# Patient Record
Sex: Female | Born: 1955 | Race: White | Hispanic: No | Smoking: Never smoker
Health system: Southern US, Community
[De-identification: ages and names within clinical notes are randomized; demographics above are authoritative.]

## PROBLEM LIST (undated history)

## (undated) ENCOUNTER — Encounter: Attending: Gastroenterology | Primary: Gastroenterology

## (undated) ENCOUNTER — Encounter

## (undated) ENCOUNTER — Telehealth

## (undated) ENCOUNTER — Ambulatory Visit: Payer: MEDICARE

## (undated) ENCOUNTER — Encounter: Attending: Anesthesiology | Primary: Anesthesiology

## (undated) ENCOUNTER — Encounter: Attending: Adult Health | Primary: Adult Health

## (undated) ENCOUNTER — Ambulatory Visit

## (undated) ENCOUNTER — Ambulatory Visit: Payer: MEDICARE | Attending: Gastroenterology | Primary: Gastroenterology

## (undated) ENCOUNTER — Ambulatory Visit: Attending: Gastroenterology | Primary: Gastroenterology

## (undated) ENCOUNTER — Encounter: Attending: Internal Medicine | Primary: Internal Medicine

## (undated) ENCOUNTER — Ambulatory Visit: Attending: Physician Assistant | Primary: Physician Assistant

## (undated) ENCOUNTER — Telehealth: Attending: Gastroenterology | Primary: Gastroenterology

## (undated) DIAGNOSIS — G2581 Restless legs syndrome: Secondary | ICD-10-CM

## (undated) DIAGNOSIS — G4700 Insomnia, unspecified: Secondary | ICD-10-CM

## (undated) DIAGNOSIS — R42 Dizziness and giddiness: Secondary | ICD-10-CM

## (undated) DIAGNOSIS — F419 Anxiety disorder, unspecified: Secondary | ICD-10-CM

## (undated) DIAGNOSIS — F32A Depression, unspecified: Secondary | ICD-10-CM

## (undated) DIAGNOSIS — K5190 Ulcerative colitis, unspecified, without complications: Secondary | ICD-10-CM

## (undated) DIAGNOSIS — K219 Gastro-esophageal reflux disease without esophagitis: Secondary | ICD-10-CM

## (undated) HISTORY — DX: Anxiety disorder, unspecified: F41.9

## (undated) HISTORY — PX: SHOULDER SURGERY: SHX246

## (undated) HISTORY — DX: Dizziness and giddiness: R42

## (undated) HISTORY — DX: Restless legs syndrome: G25.81

## (undated) HISTORY — DX: Ulcerative colitis, unspecified, without complications: K51.90

## (undated) HISTORY — DX: Depression, unspecified: F32.A

## (undated) HISTORY — PX: SPINE SURGERY: SHX786

## (undated) HISTORY — DX: Insomnia, unspecified: G47.00

## (undated) HISTORY — PX: NECK SURGERY: SHX720

## (undated) HISTORY — DX: Gastro-esophageal reflux disease without esophagitis: K21.9

## (undated) MED ORDER — USTEKINUMAB 90 MG/ML SUBCUTANEOUS SYRINGE: SUBCUTANEOUS | 0.00000 days

## (undated) MED ORDER — OMEPRAZOLE 20 MG CAPSULE,DELAYED RELEASE: capsule | 1 refills | 0 days

---

## 2008-02-03 ENCOUNTER — Inpatient Hospital Stay: Payer: Self-pay | Admitting: Internal Medicine

## 2008-02-03 ENCOUNTER — Ambulatory Visit: Payer: Self-pay | Admitting: Internal Medicine

## 2008-02-25 ENCOUNTER — Ambulatory Visit: Payer: Self-pay | Admitting: Unknown Physician Specialty

## 2008-06-16 ENCOUNTER — Ambulatory Visit: Payer: Self-pay | Admitting: Internal Medicine

## 2008-07-04 ENCOUNTER — Ambulatory Visit: Payer: Self-pay | Admitting: Family Medicine

## 2009-11-25 ENCOUNTER — Emergency Department: Payer: Self-pay | Admitting: Emergency Medicine

## 2009-12-01 ENCOUNTER — Encounter: Payer: Self-pay | Admitting: Specialist

## 2009-12-05 ENCOUNTER — Emergency Department: Payer: Self-pay | Admitting: Emergency Medicine

## 2012-06-03 DIAGNOSIS — G4733 Obstructive sleep apnea (adult) (pediatric): Secondary | ICD-10-CM | POA: Insufficient documentation

## 2012-06-03 DIAGNOSIS — M810 Age-related osteoporosis without current pathological fracture: Secondary | ICD-10-CM | POA: Insufficient documentation

## 2012-06-03 DIAGNOSIS — M818 Other osteoporosis without current pathological fracture: Secondary | ICD-10-CM | POA: Insufficient documentation

## 2012-06-03 DIAGNOSIS — N393 Stress incontinence (female) (male): Secondary | ICD-10-CM | POA: Insufficient documentation

## 2012-06-03 DIAGNOSIS — R413 Other amnesia: Secondary | ICD-10-CM | POA: Insufficient documentation

## 2012-06-03 DIAGNOSIS — Z8619 Personal history of other infectious and parasitic diseases: Secondary | ICD-10-CM | POA: Insufficient documentation

## 2012-06-03 DIAGNOSIS — M8580 Other specified disorders of bone density and structure, unspecified site: Secondary | ICD-10-CM | POA: Insufficient documentation

## 2012-06-03 DIAGNOSIS — E042 Nontoxic multinodular goiter: Secondary | ICD-10-CM | POA: Insufficient documentation

## 2012-06-03 DIAGNOSIS — N959 Unspecified menopausal and perimenopausal disorder: Secondary | ICD-10-CM | POA: Insufficient documentation

## 2012-06-03 DIAGNOSIS — K589 Irritable bowel syndrome without diarrhea: Secondary | ICD-10-CM | POA: Insufficient documentation

## 2012-06-03 DIAGNOSIS — R7301 Impaired fasting glucose: Secondary | ICD-10-CM | POA: Insufficient documentation

## 2013-02-13 ENCOUNTER — Ambulatory Visit: Payer: Self-pay | Admitting: Orthopedic Surgery

## 2013-06-17 ENCOUNTER — Ambulatory Visit: Payer: Self-pay | Admitting: Internal Medicine

## 2013-06-17 LAB — URINALYSIS, COMPLETE
Glucose,UR: NEGATIVE mg/dL (ref 0–75)
Ketone: NEGATIVE
Nitrite: NEGATIVE
Ph: 7.5 (ref 4.5–8.0)
Protein: NEGATIVE
WBC UR: NONE SEEN /HPF (ref 0–5)

## 2015-10-26 DIAGNOSIS — K5100 Ulcerative (chronic) pancolitis without complications: Secondary | ICD-10-CM | POA: Insufficient documentation

## 2017-02-05 ENCOUNTER — Other Ambulatory Visit: Payer: Self-pay | Admitting: Nurse Practitioner

## 2017-02-05 DIAGNOSIS — R413 Other amnesia: Secondary | ICD-10-CM

## 2017-02-15 ENCOUNTER — Ambulatory Visit
Admission: RE | Admit: 2017-02-15 | Discharge: 2017-02-15 | Disposition: A | Discharge status: Home / Self Care | Payer: BLUE CROSS/BLUE SHIELD | Source: Ambulatory Visit | Attending: Nurse Practitioner | Admitting: Nurse Practitioner

## 2017-02-15 ENCOUNTER — Encounter: Payer: Self-pay | Admitting: Radiology

## 2017-02-15 DIAGNOSIS — M899 Disorder of bone, unspecified: Secondary | ICD-10-CM | POA: Diagnosis not present

## 2017-02-15 DIAGNOSIS — R413 Other amnesia: Secondary | ICD-10-CM | POA: Diagnosis present

## 2017-02-15 LAB — POCT I-STAT CREATININE: CREATININE: 0.8 mg/dL (ref 0.44–1.00)

## 2017-02-15 MED ORDER — GADOBENATE DIMEGLUMINE 529 MG/ML IV SOLN
15.0000 mL | Freq: Once | INTRAVENOUS | Status: AC | PRN
  Administered 2017-02-15: 14 mL via INTRAVENOUS

## 2017-03-22 ENCOUNTER — Other Ambulatory Visit: Payer: Self-pay | Admitting: Neurosurgery

## 2017-03-22 DIAGNOSIS — M899 Disorder of bone, unspecified: Secondary | ICD-10-CM

## 2017-03-25 ENCOUNTER — Other Ambulatory Visit: Payer: Self-pay | Admitting: Neurosurgery

## 2017-03-25 DIAGNOSIS — M899 Disorder of bone, unspecified: Secondary | ICD-10-CM

## 2017-04-05 ENCOUNTER — Ambulatory Visit
Admission: RE | Admit: 2017-04-05 | Discharge: 2017-04-05 | Disposition: A | Discharge status: Home / Self Care | Payer: BLUE CROSS/BLUE SHIELD | Source: Ambulatory Visit | Attending: Neurosurgery | Admitting: Neurosurgery

## 2017-05-10 ENCOUNTER — Other Ambulatory Visit: Payer: Self-pay | Admitting: Neurosurgery

## 2017-05-10 DIAGNOSIS — M899 Disorder of bone, unspecified: Secondary | ICD-10-CM

## 2017-06-11 ENCOUNTER — Ambulatory Visit
Admission: RE | Admit: 2017-06-11 | Discharge: 2017-06-11 | Disposition: A | Discharge status: Home / Self Care | Payer: BLUE CROSS/BLUE SHIELD | Source: Ambulatory Visit | Attending: Neurosurgery | Admitting: Neurosurgery

## 2017-06-11 DIAGNOSIS — M899 Disorder of bone, unspecified: Secondary | ICD-10-CM | POA: Insufficient documentation

## 2017-06-11 DIAGNOSIS — R9082 White matter disease, unspecified: Secondary | ICD-10-CM | POA: Insufficient documentation

## 2017-06-11 MED ORDER — GADOBENATE DIMEGLUMINE 529 MG/ML IV SOLN
15.0000 mL | Freq: Once | INTRAVENOUS | Status: AC | PRN
  Administered 2017-06-11: 14 mL via INTRAVENOUS

## 2017-06-12 LAB — POCT I-STAT CREATININE: Creatinine, Ser: 1 mg/dL (ref 0.44–1.00)

## 2017-07-10 ENCOUNTER — Other Ambulatory Visit: Payer: Self-pay | Admitting: Neurosurgery

## 2017-07-10 DIAGNOSIS — M899 Disorder of bone, unspecified: Secondary | ICD-10-CM

## 2017-11-17 ENCOUNTER — Emergency Department: Admit: 2017-11-17 | Discharge: 2017-11-17 | Disposition: A | Discharge status: Home / Self Care | Payer: MEDICARE

## 2017-11-17 ENCOUNTER — Ambulatory Visit: Admit: 2017-11-17 | Discharge: 2017-11-17 | Disposition: A | Discharge status: Home / Self Care | Payer: MEDICARE

## 2017-11-17 DIAGNOSIS — M549 Dorsalgia, unspecified: Principal | ICD-10-CM

## 2018-01-23 ENCOUNTER — Other Ambulatory Visit
Admission: RE | Admit: 2018-01-23 | Discharge: 2018-01-23 | Disposition: A | Discharge status: Home / Self Care | Payer: BLUE CROSS/BLUE SHIELD | Source: Ambulatory Visit | Attending: Nurse Practitioner | Admitting: Nurse Practitioner

## 2018-01-23 DIAGNOSIS — K5100 Ulcerative (chronic) pancolitis without complications: Secondary | ICD-10-CM | POA: Diagnosis present

## 2018-01-23 LAB — GASTROINTESTINAL PANEL BY PCR, STOOL (REPLACES STOOL CULTURE)

## 2018-01-23 LAB — C DIFFICILE QUICK SCREEN W PCR REFLEX
C Diff antigen: NEGATIVE
C Diff interpretation: NOT DETECTED
C Diff toxin: NEGATIVE

## 2018-01-27 LAB — CALPROTECTIN, FECAL: Calprotectin, Fecal: 428 ug/g — ABNORMAL HIGH (ref 0–120)

## 2018-03-05 ENCOUNTER — Ambulatory Visit: Admit: 2018-03-05 | Discharge: 2018-03-05 | Payer: MEDICARE | Attending: Surgery | Primary: Surgery

## 2018-03-05 DIAGNOSIS — M25512 Pain in left shoulder: Secondary | ICD-10-CM

## 2018-03-05 DIAGNOSIS — G8929 Other chronic pain: Secondary | ICD-10-CM

## 2018-03-05 DIAGNOSIS — D1722 Benign lipomatous neoplasm of skin and subcutaneous tissue of left arm: Principal | ICD-10-CM

## 2018-03-12 ENCOUNTER — Ambulatory Visit: Admit: 2018-03-12 | Discharge: 2018-03-13 | Payer: MEDICARE

## 2018-03-12 DIAGNOSIS — D1722 Benign lipomatous neoplasm of skin and subcutaneous tissue of left arm: Principal | ICD-10-CM

## 2018-03-12 DIAGNOSIS — M25512 Pain in left shoulder: Secondary | ICD-10-CM

## 2018-03-12 DIAGNOSIS — G8929 Other chronic pain: Secondary | ICD-10-CM

## 2018-03-14 ENCOUNTER — Ambulatory Visit: Admit: 2018-03-14 | Discharge: 2018-03-15 | Payer: MEDICARE

## 2018-03-14 DIAGNOSIS — G8929 Other chronic pain: Secondary | ICD-10-CM

## 2018-03-14 DIAGNOSIS — D1722 Benign lipomatous neoplasm of skin and subcutaneous tissue of left arm: Principal | ICD-10-CM

## 2018-03-14 DIAGNOSIS — M25512 Pain in left shoulder: Secondary | ICD-10-CM

## 2018-03-19 ENCOUNTER — Ambulatory Visit: Admit: 2018-03-19 | Discharge: 2018-03-20 | Payer: MEDICARE | Attending: Gastroenterology | Primary: Gastroenterology

## 2018-03-19 DIAGNOSIS — K51019 Ulcerative (chronic) pancolitis with unspecified complications: Principal | ICD-10-CM

## 2018-03-25 ENCOUNTER — Ambulatory Visit
Admission: RE | Admit: 2018-03-25 | Discharge: 2018-03-25 | Disposition: A | Discharge status: Home / Self Care | Payer: BLUE CROSS/BLUE SHIELD | Source: Ambulatory Visit | Attending: Neurosurgery | Admitting: Neurosurgery

## 2018-03-25 DIAGNOSIS — D1722 Benign lipomatous neoplasm of skin and subcutaneous tissue of left arm: Principal | ICD-10-CM

## 2018-03-26 ENCOUNTER — Ambulatory Visit: Admit: 2018-03-26 | Discharge: 2018-03-26 | Payer: MEDICARE

## 2018-03-26 ENCOUNTER — Encounter
Admit: 2018-03-26 | Discharge: 2018-03-26 | Payer: MEDICARE | Attending: Certified Registered" | Primary: Certified Registered"

## 2018-03-26 DIAGNOSIS — D1722 Benign lipomatous neoplasm of skin and subcutaneous tissue of left arm: Principal | ICD-10-CM

## 2018-03-26 MED ORDER — OXYCODONE 5 MG TABLET
ORAL_TABLET | Freq: Four times a day (QID) | ORAL | 0 refills | 0.00000 days | Status: CP | PRN
Start: 2018-03-26 — End: 2018-04-07

## 2018-04-07 ENCOUNTER — Ambulatory Visit: Admit: 2018-04-07 | Discharge: 2018-04-08 | Payer: MEDICARE | Attending: Surgery | Primary: Surgery

## 2018-04-07 DIAGNOSIS — Z4889 Encounter for other specified surgical aftercare: Principal | ICD-10-CM

## 2018-04-10 ENCOUNTER — Ambulatory Visit: Payer: BLUE CROSS/BLUE SHIELD

## 2018-04-15 MED ORDER — BUDESONIDE DR-ER 9 MG TABLET,DELAYED AND EXTENDED RELEASE
ORAL_TABLET | Freq: Every day | ORAL | 0 refills | 0.00000 days | Status: CP
Start: 2018-04-15 — End: 2018-05-08

## 2018-04-25 ENCOUNTER — Ambulatory Visit
Admission: RE | Admit: 2018-04-25 | Discharge: 2018-04-25 | Disposition: A | Discharge status: Home / Self Care | Payer: BLUE CROSS/BLUE SHIELD | Source: Ambulatory Visit | Attending: Neurosurgery | Admitting: Neurosurgery

## 2018-04-25 DIAGNOSIS — M899 Disorder of bone, unspecified: Secondary | ICD-10-CM

## 2018-04-25 MED ORDER — GADOBENATE DIMEGLUMINE 529 MG/ML IV SOLN
15.0000 mL | Freq: Once | INTRAVENOUS | Status: AC | PRN
  Administered 2018-04-25: 14 mL via INTRAVENOUS

## 2018-05-01 ENCOUNTER — Ambulatory Visit: Admit: 2018-05-01 | Discharge: 2018-05-02 | Payer: MEDICARE

## 2018-05-01 DIAGNOSIS — K51019 Ulcerative (chronic) pancolitis with unspecified complications: Principal | ICD-10-CM

## 2018-05-05 ENCOUNTER — Ambulatory Visit (INDEPENDENT_AMBULATORY_CARE_PROVIDER_SITE_OTHER)
Admit: 2018-05-05 | Discharge: 2018-05-05 | Disposition: A | Discharge status: Home / Self Care | Payer: BLUE CROSS/BLUE SHIELD | Attending: Family Medicine | Admitting: Family Medicine

## 2018-05-05 ENCOUNTER — Other Ambulatory Visit: Payer: Self-pay

## 2018-05-05 ENCOUNTER — Ambulatory Visit
Admission: EM | Admit: 2018-05-05 | Discharge: 2018-05-05 | Disposition: A | Discharge status: Home / Self Care | Payer: BLUE CROSS/BLUE SHIELD | Attending: Family Medicine | Admitting: Family Medicine

## 2018-05-05 DIAGNOSIS — R1011 Right upper quadrant pain: Secondary | ICD-10-CM

## 2018-05-05 DIAGNOSIS — R197 Diarrhea, unspecified: Secondary | ICD-10-CM | POA: Diagnosis not present

## 2018-05-05 DIAGNOSIS — R110 Nausea: Secondary | ICD-10-CM | POA: Diagnosis not present

## 2018-05-05 DIAGNOSIS — R1013 Epigastric pain: Secondary | ICD-10-CM | POA: Diagnosis not present

## 2018-05-05 LAB — COMPREHENSIVE METABOLIC PANEL
ALBUMIN: 3.4 g/dL — AB (ref 3.5–5.0)
ALT: 9 U/L (ref 0–44)
AST: 13 U/L — ABNORMAL LOW (ref 15–41)
Alkaline Phosphatase: 74 U/L (ref 38–126)
Anion gap: 13 (ref 5–15)
BILIRUBIN TOTAL: 0.4 mg/dL (ref 0.3–1.2)
BUN: 12 mg/dL (ref 8–23)
CO2: 24 mmol/L (ref 22–32)
CREATININE: 0.88 mg/dL (ref 0.44–1.00)
Calcium: 9.3 mg/dL (ref 8.9–10.3)
Chloride: 101 mmol/L (ref 98–111)
GFR calc Af Amer: >60 mL/min (ref >60)
GFR calc non Af Amer: >60 mL/min (ref >60)
GLUCOSE: 97 mg/dL (ref 70–99)
Potassium: 3.1 mmol/L — ABNORMAL LOW (ref 3.5–5.1)
SODIUM: 138 mmol/L (ref 135–145)
TOTAL PROTEIN: 7.7 g/dL (ref 6.5–8.1)

## 2018-05-05 LAB — CBC WITH DIFFERENTIAL/PLATELET
BASOS PCT: 0 %
Basophils Absolute: 0 10*3/uL (ref 0–0.1)
EOS ABS: 0.2 10*3/uL (ref 0–0.7)
EOS PCT: 2 %
HEMATOCRIT: 37.3 % (ref 35.0–47.0)
Hemoglobin: 12.3 g/dL (ref 12.0–16.0)
Lymphocytes Relative: 16 %
Lymphs Abs: 1.8 10*3/uL (ref 1.0–3.6)
MCH: 28.5 pg (ref 26.0–34.0)
MCHC: 33 g/dL (ref 32.0–36.0)
MCV: 86.2 fL (ref 80.0–100.0)
MONO ABS: 0.9 10*3/uL (ref 0.2–0.9)
MONOS PCT: 8 %
Neutro Abs: 8.4 10*3/uL — ABNORMAL HIGH (ref 1.4–6.5)
Neutrophils Relative %: 74 %
PLATELETS: 359 10*3/uL (ref 150–440)
RBC: 4.32 MIL/uL (ref 3.80–5.20)
RDW: 12.7 % (ref 11.5–14.5)
WBC: 11.4 10*3/uL — ABNORMAL HIGH (ref 3.6–11.0)

## 2018-05-05 LAB — LIPASE, BLOOD: Lipase: 23 U/L (ref 11–51)

## 2018-05-05 MED ORDER — HYDROCODONE-ACETAMINOPHEN 5-325 MG PO TABS: 1.0000 | ORAL_TABLET | Freq: Three times a day (TID) | ORAL | 0 refills | Status: DC | PRN

## 2018-05-05 MED ORDER — ACETAMINOPHEN 500 MG PO TABS
1000.0000 mg | ORAL_TABLET | Freq: Once | ORAL | Status: AC
  Administered 2018-05-05: 1000 mg via ORAL

## 2018-05-05 NOTE — ED Provider Notes (Signed)
[facility]    CSN: [id number] Arrival date & time: 05/05/18  1237  History   Chief Complaint Chief Complaint  Patient presents with  . Abdominal Pain    upper mid abdomen   HPI  62 year old female with ulcerative colitis (pan colitis) presents with severe epigastric pain.  Patient recently started a new infusion therapy.  Patient states that for the past week she has had ongoing epigastric abdominal pain.  Is worsening.  Pain is severe.  She states that it feels similar to prior bout of pancreatitis which was medication induced.  She endorses compliance with her Uceris and other medication.  She states that the pain radiates to her back.  She reports ongoing diarrhea and bloody stool which she has chronically.  No fever or chills.  She does report nausea.  No vomiting.  Patient is concerned about medication adverse effect.  No other associated symptoms.  No other complaints.  PMH: Obstructive sleep apnea 06/03/2012 Overview: OSA (mild to mod)/ PLMS (1/04)/ Intol to CPAP 5cm/ Sleep Med consult (3/08), cancelled Sleep Study, DKA F/U   IBS (irritable bowel syndrome) 06/03/2012 Overview: Irritable bowel syndrome, constipation predominant Celiac sprue serology NEG 08/2012  IFG (impaired fasting glucose) 06/03/2012 Overview: Impaired fasting glucose/ HbA1C 5/5% (08/2010)   Multinodular goiter 06/03/2012 Overview: Goiter (11/2009) on C-spine MRI and US/ TFT's WNL (low normal) (11/2009)   Osteopenia 06/03/2012 Overview: Osteopenia Nephrolithiasis, absorptive hypercalciuria H/O Increased PTH elevated (2001) 24hr urine Ca elevated (2001) PTH, Ca++, 25-OH, 1,25 OH Vit D WNL (2002) BMD -2.3 (1/09) BMD (09/2007) L-S spine -2.3; FN -1.9 (0.637) BMD (10/2009) FN T-s -2.2, 0.601 BMD (11/2011) GE Lunar Femoral Neck T-s -1.9, (0.769) Vit D low 24.1 08/2012  Nephrolithiasis 06/03/2012   History of Clostridium difficile infection 06/03/2012 Overview: C diff colitis (11/09)  Memory loss 06/03/2012 Overview:  Memory loss (2011)/ B12 466 (08/2010), Neurocognitive evaluation (10/2010) mild BCI, amnestic type, ?taper and D/C meds for mood, repeat evaluation in 1 yr   Menopausal and postmenopausal disorder 06/03/2012 Overview: Perimenopausal (8/03)/ Menstrual irreg's/ FBCD/ DUB, GYN eval'n (3/07)/LMP (2/09)/ GYN evaluation (4/09) DUB, EV U/S NEG (5/09)   Stress incontinence in female 06/03/2012   Ulcerative pancolitis (CMS-HCC) 10/2015   Pancreatitis    History of Clostridium difficile infection    Vascular lesion     Past Surgical History:  Procedure Laterality Date  . NECK SURGERY    . SHOULDER SURGERY Left   colonoscopy  OB History   None      Home Medications    Prior to Admission medications   Medication Sig Start Date End Date Taking? Authorizing Provider  Black Pepper-Turmeric (TURMERIC COMPLEX/BLACK PEPPER) 3-500 MG CAPS Take by mouth.   Yes [provider]  Budesonide ER 9 MG TB24  04/18/18  Yes [provider]  citalopram (CELEXA) 40 MG tablet TAKE 1 TABLET BY MOUTH ONCE DAILY 08/01/16  Yes [provider]  clonazePAM (KLONOPIN) 0.5 MG tablet Take by mouth. 10/25/16  Yes [provider]  omeprazole (PRILOSEC) 20 MG capsule Take by mouth. 11/29/17 11/29/18 Yes [provider]  Probiotic Product (PROBIOTIC-10) CAPS Take by mouth.   Yes [provider]  triamterene-hydrochlorothiazide (DYAZIDE) 37.5-25 MG capsule Take by mouth. 10/14/16  Yes [provider]  HYDROcodone-acetaminophen (NORCO/VICODIN) 5-325 MG tablet Take 1 tablet by mouth every 8 (eight) hours as needed for moderate pain. 05/05/18   Coral Spikes, DO    Family History Lung cancer Brother    Dementia  Father    Diabetes Father    Diabetes Mother    High blood pressure (Hypertension) Mother    Breast cancer Paternal Grandmother     Social History Social History   Tobacco Use  . Smoking status: Never Smoker  . Smokeless tobacco: Never Used    Substance Use Topics  . Alcohol use: Not Currently    Frequency: Never  . Drug use: Not Currently   Allergies   Mesalamine; Prednisone; Amoxicillin; Metronidazole; and Trazodone and nefazodone   Review of Systems Review of Systems  Constitutional: Negative for fever.  Gastrointestinal: Positive for abdominal pain, blood in stool, diarrhea and nausea. Negative for vomiting.  Neurological: Positive for dizziness.   Physical Exam Triage Vital Signs ED Triage Vitals [05/05/18 1254]  Enc Vitals Group     BP 126/71     Pulse Rate 62     Resp 18     Temp 98.5 F (36.9 C)     Temp Source Oral     SpO2 99 %     Weight      Height      Head Circumference      Peak Flow      Pain Score 10     Pain Loc      Pain Edu?      Excl. in [place]?    Updated Vital Signs BP 126/71 (BP Location: Left Arm)   Pulse 62   Temp 98.5 F (36.9 C) (Oral)   Resp 18   SpO2 99%   Physical Exam  Constitutional: She is oriented to person, place, and time. She appears well-developed.  Appears in distress secondary to pain.   HENT:  Head: Normocephalic and atraumatic.  Cardiovascular: Normal rate and regular rhythm.  Pulmonary/Chest: Effort normal. No respiratory distress.  Abdominal: Soft. She exhibits no distension.  Tender to palpation in the epigastric region.  Exam very limited as patient tolerates very little palpation.  Neurological: She is alert and oriented to person, place, and time.  Psychiatric: She has a normal mood and affect. Her behavior is normal.  Nursing note and vitals reviewed.  UC Treatments / Results  Labs (all labs ordered are listed, but only abnormal results are displayed) Labs Reviewed  COMPREHENSIVE METABOLIC PANEL - Abnormal; Notable for the following components:      Result Value   Potassium 3.1 (*)    Albumin 3.4 (*)    AST 13 (*)    All other components within normal limits  CBC WITH DIFFERENTIAL/PLATELET - Abnormal; Notable for the following components:    WBC 11.4 (*)    Neutro Abs 8.4 (*)    All other components within normal limits  LIPASE, BLOOD    EKG None  Radiology US Abdomen Limited Ruq  Result Date: 05/05/2018 CLINICAL DATA:  Epigastric pain EXAM: ULTRASOUND ABDOMEN LIMITED RIGHT UPPER QUADRANT COMPARISON:  06/17/2013, ultrasound 02/03/2008 FINDINGS: Gallbladder: No shadowing stones or wall thickening. Negative for pericholecystic fluid. Sonographer reports positive sonographic Murphy. Common bile duct: Diameter: 2.8 mm Liver: No focal lesion identified. Within normal limits in parenchymal echogenicity. Portal vein is patent on color Doppler imaging with normal direction of blood flow towards the liver. IMPRESSION: Sonographer reports significant tenderness to the right upper quadrant with transducer palpation however the study is negative for gallstones, biliary dilatation, or other sonographic evidence for an acute cholecystitis. CT correlation may be obtained as clinically indicated. Electronically Signed   By: Donavan Foil M.D.   On: 05/05/2018 14:32  Procedures Procedures (including critical care time)  Medications Ordered in UC Medications  acetaminophen (TYLENOL) tablet 1,000 mg (1,000 mg Oral Given 05/05/18 1402)    Initial Impression / Assessment and Plan / UC Course  I have reviewed the triage vital signs and the nursing notes.  Pertinent labs & imaging results that were available during my care of the patient were reviewed by me and considered in my medical decision making (see chart for details).    62 year old female presents with acute epigastric abdominal pain. [place] and labs unrevealing. Discussed with patients GI physician. He recommended stool testing. Sending home with kit. Treating pain with PRN Vicodin. [place] controlled substance database reviewed. No concerns for abuse.  Final Clinical Impressions(s) / UC Diagnoses   Final diagnoses:  Epigastric pain     Discharge Instructions     Pain medication as  needed.  Labs and [place] were negative.  GI will call with an appt.  Take care  Dr. Lacinda Axon     ED Prescriptions    Medication Sig Dispense Auth. Provider   HYDROcodone-acetaminophen (NORCO/VICODIN) 5-325 MG tablet Take 1 tablet by mouth every 8 (eight) hours as needed for moderate pain. 15 tablet Coral Spikes, DO     Controlled Substance Prescriptions [place] Controlled Substance Registry consulted? Yes, I have consulted the [place] Controlled Substances Registry for this patient, and feel the risk/benefit ratio today is favorable for proceeding with this prescription for a controlled substance.   Coral Spikes, [place] 05/05/18 1452

## 2018-05-05 NOTE — ED Triage Notes (Signed)
Patient complains of upper mid abdominal that started over the weekend. Patient states that she had an infusion on intivio on Thursday. States that she has had pain like this when lipase has been very elevate, never this severe.

## 2018-05-05 NOTE — Discharge Instructions (Signed)
Pain medication as needed.  Labs and [place] were negative.  GI will call with an appt.  Take care  Dr. Lacinda Axon

## 2018-05-06 ENCOUNTER — Other Ambulatory Visit
Admission: RE | Admit: 2018-05-06 | Discharge: 2018-05-06 | Disposition: A | Discharge status: Home / Self Care | Payer: BLUE CROSS/BLUE SHIELD | Source: Ambulatory Visit | Attending: Family Medicine | Admitting: Family Medicine

## 2018-05-06 DIAGNOSIS — R1013 Epigastric pain: Secondary | ICD-10-CM | POA: Diagnosis present

## 2018-05-06 LAB — GASTROINTESTINAL PANEL BY PCR, STOOL (REPLACES STOOL CULTURE)

## 2018-05-06 LAB — C DIFFICILE QUICK SCREEN W PCR REFLEX
C DIFFICILE (CDIFF) TOXIN: NEGATIVE
C DIFFICLE (CDIFF) ANTIGEN: NEGATIVE
C Diff interpretation: NOT DETECTED

## 2018-05-08 ENCOUNTER — Telehealth (HOSPITAL_COMMUNITY): Payer: Self-pay

## 2018-05-08 ENCOUNTER — Ambulatory Visit: Admit: 2018-05-08 | Discharge: 2018-05-09 | Payer: MEDICARE | Attending: Gastroenterology | Primary: Gastroenterology

## 2018-05-08 DIAGNOSIS — K51019 Ulcerative (chronic) pancolitis with unspecified complications: Principal | ICD-10-CM

## 2018-05-08 MED ORDER — PREDNISONE 10 MG TABLET
ORAL_TABLET | 0 refills | 0 days | Status: CP
Start: 2018-05-08 — End: 2018-05-21

## 2018-05-08 NOTE — Telephone Encounter (Signed)
Results are within normal range. Pt contacted and made aware. Verbalized understanding.

## 2018-05-13 MED ORDER — OMEPRAZOLE 20 MG CAPSULE,DELAYED RELEASE
ORAL_CAPSULE | 1 refills | 0 days | Status: CP
Start: 2018-05-13 — End: 2018-12-01

## 2018-05-14 ENCOUNTER — Ambulatory Visit
Admit: 2018-05-14 | Discharge: 2018-05-21 | Disposition: A | Discharge status: Home / Self Care | Payer: MEDICARE | Admitting: Internal Medicine

## 2018-05-14 DIAGNOSIS — F329 Major depressive disorder, single episode, unspecified: Secondary | ICD-10-CM | POA: Insufficient documentation

## 2018-05-14 DIAGNOSIS — R55 Syncope and collapse: Principal | ICD-10-CM

## 2018-05-14 DIAGNOSIS — F32A Depression, unspecified: Secondary | ICD-10-CM | POA: Insufficient documentation

## 2018-05-22 MED ORDER — PREDNISONE 10 MG TABLET
ORAL_TABLET | Freq: Every day | ORAL | 0 refills | 0.00000 days | Status: CP
Start: 2018-05-22 — End: 2018-06-06

## 2018-05-22 MED ORDER — DICYCLOMINE 10 MG CAPSULE
ORAL_CAPSULE | Freq: Four times a day (QID) | ORAL | 3 refills | 0.00000 days | Status: CP
Start: 2018-05-22 — End: 2018-08-28

## 2018-05-22 MED ORDER — PREDNISONE 50 MG TABLET
ORAL_TABLET | Freq: Every day | ORAL | 0 refills | 0 days | Status: CP
Start: 2018-05-22 — End: 2018-05-21

## 2018-05-23 ENCOUNTER — Ambulatory Visit: Admit: 2018-05-23 | Discharge: 2018-05-24 | Payer: MEDICARE

## 2018-05-23 DIAGNOSIS — K51019 Ulcerative (chronic) pancolitis with unspecified complications: Principal | ICD-10-CM

## 2018-06-06 ENCOUNTER — Ambulatory Visit: Admit: 2018-06-06 | Discharge: 2018-06-06 | Payer: MEDICARE | Attending: Gastroenterology | Primary: Gastroenterology

## 2018-06-06 ENCOUNTER — Ambulatory Visit: Admit: 2018-06-06 | Discharge: 2018-06-06 | Payer: MEDICARE

## 2018-06-06 DIAGNOSIS — K51019 Ulcerative (chronic) pancolitis with unspecified complications: Principal | ICD-10-CM

## 2018-06-06 MED ORDER — PREDNISONE 10 MG TABLET
ORAL_TABLET | 0 refills | 0 days | Status: CP
Start: 2018-06-06 — End: 2018-08-28

## 2018-07-04 ENCOUNTER — Ambulatory Visit: Admit: 2018-07-04 | Discharge: 2018-07-05 | Payer: MEDICARE

## 2018-07-04 DIAGNOSIS — K51019 Ulcerative (chronic) pancolitis with unspecified complications: Principal | ICD-10-CM

## 2018-08-26 ENCOUNTER — Ambulatory Visit: Admit: 2018-08-26 | Discharge: 2018-08-27 | Payer: MEDICARE

## 2018-08-26 DIAGNOSIS — K51019 Ulcerative (chronic) pancolitis with unspecified complications: Principal | ICD-10-CM

## 2018-08-28 ENCOUNTER — Ambulatory Visit: Admit: 2018-08-28 | Discharge: 2018-08-29 | Payer: MEDICARE

## 2018-08-28 ENCOUNTER — Ambulatory Visit: Admit: 2018-08-28 | Discharge: 2018-08-29 | Payer: MEDICARE | Attending: Gastroenterology | Primary: Gastroenterology

## 2018-08-28 DIAGNOSIS — K51019 Ulcerative (chronic) pancolitis with unspecified complications: Principal | ICD-10-CM

## 2018-10-22 MED ORDER — PEG-ELECTROLYTE SOLUTION 420 GRAM ORAL SOLUTION
0 refills | 0 days | Status: CP
Start: 2018-10-22 — End: 2018-11-26

## 2018-10-23 ENCOUNTER — Ambulatory Visit: Admit: 2018-10-23 | Discharge: 2018-10-24 | Payer: MEDICARE

## 2018-10-23 DIAGNOSIS — K51019 Ulcerative (chronic) pancolitis with unspecified complications: Principal | ICD-10-CM

## 2018-11-26 ENCOUNTER — Encounter: Admit: 2018-11-26 | Discharge: 2018-11-26 | Payer: MEDICARE

## 2018-11-26 ENCOUNTER — Ambulatory Visit: Admit: 2018-11-26 | Discharge: 2018-11-26 | Payer: MEDICARE

## 2018-11-26 DIAGNOSIS — K635 Polyp of colon: Principal | ICD-10-CM

## 2018-11-26 DIAGNOSIS — K219 Gastro-esophageal reflux disease without esophagitis: Principal | ICD-10-CM

## 2018-11-26 DIAGNOSIS — F419 Anxiety disorder, unspecified: Principal | ICD-10-CM

## 2018-11-26 DIAGNOSIS — F0390 Unspecified dementia without behavioral disturbance: Principal | ICD-10-CM

## 2018-11-26 DIAGNOSIS — K51019 Ulcerative (chronic) pancolitis with unspecified complications: Principal | ICD-10-CM

## 2018-11-26 DIAGNOSIS — K529 Noninfective gastroenteritis and colitis, unspecified: Principal | ICD-10-CM

## 2018-11-26 DIAGNOSIS — F329 Major depressive disorder, single episode, unspecified: Principal | ICD-10-CM

## 2018-11-26 DIAGNOSIS — K5140 Inflammatory polyps of colon without complications: Principal | ICD-10-CM

## 2018-11-26 DIAGNOSIS — K5100 Ulcerative (chronic) pancolitis without complications: Principal | ICD-10-CM

## 2018-11-26 DIAGNOSIS — I959 Hypotension, unspecified: Principal | ICD-10-CM

## 2018-11-26 DIAGNOSIS — T4145XA Adverse effect of unspecified anesthetic, initial encounter: Principal | ICD-10-CM

## 2018-11-26 DIAGNOSIS — G2581 Restless legs syndrome: Principal | ICD-10-CM

## 2018-11-26 LAB — HM COLONOSCOPY

## 2018-11-26 MED ORDER — HUMIRA PEN CITRATE FREE 40 MG/0.4 ML
SUBCUTANEOUS | 3 refills | 0 days | Status: CP
Start: 2018-11-26 — End: 2019-01-09

## 2018-11-26 MED ORDER — HUMIRA PEN CITRATE FREE STARTER PACK FOR CROHN'S/UC/HS 3 X 80 MG/0.8 ML
0 refills | 0 days | Status: CP
Start: 2018-11-26 — End: 2019-01-09

## 2018-11-26 NOTE — Unmapped (Signed)
Per test claim for HUMIRA at the [facility], patient needs Medication Assistance Program for Prior Authorization.

## 2018-11-26 NOTE — Unmapped (Signed)
Per Dr. Stevphen Rochester will stop remicade and start humira as alternative due to reports of significant joint pain on remicade.  Remicade discontinued and appointments cancelled. Humira scripts sent to [facility] for processing and fill.  Nurse ambassador request sent for in person self administration teaching visit.

## 2018-11-27 DIAGNOSIS — K219 Gastro-esophageal reflux disease without esophagitis: Principal | ICD-10-CM

## 2018-11-27 DIAGNOSIS — G2581 Restless legs syndrome: Principal | ICD-10-CM

## 2018-11-27 DIAGNOSIS — F0390 Unspecified dementia without behavioral disturbance: Principal | ICD-10-CM

## 2018-11-27 DIAGNOSIS — T4145XA Adverse effect of unspecified anesthetic, initial encounter: Principal | ICD-10-CM

## 2018-11-27 DIAGNOSIS — K529 Noninfective gastroenteritis and colitis, unspecified: Principal | ICD-10-CM

## 2018-11-27 DIAGNOSIS — I959 Hypotension, unspecified: Principal | ICD-10-CM

## 2018-11-27 DIAGNOSIS — F329 Major depressive disorder, single episode, unspecified: Principal | ICD-10-CM

## 2018-11-27 DIAGNOSIS — F419 Anxiety disorder, unspecified: Principal | ICD-10-CM

## 2018-12-01 MED ORDER — OMEPRAZOLE 20 MG CAPSULE,DELAYED RELEASE
ORAL_CAPSULE | 1 refills | 0 days | Status: CP
Start: 2018-12-01 — End: ?

## 2018-12-01 NOTE — Unmapped (Signed)
[facility] Special[facility] Medication Referral: PA Approved      Medication (Brand/Generic): Humira    Final Test Claim completed with resulted information below:    Patient ABLE to fill at [facility]  Insurance Company:  [employer]  Anticipated Copay: $0  Is anticipated copay with a copay card or grant? No, there is no need for grant or copay assistance.     Does patient's insurance plan only allow a 15 day supply for the first 6 fills in the [place] Program? No  If yes, inform patient they can request to dis-enroll from the [facility] by calling the patient help desk at N/A.      If the copay is under the $25 defined limit, per policy there will be no further investigation of need for financial assistance at this time unless patient requests. This referral has been communicated to the provider and handed off to the [facility] team for further processing and filling of prescribed medication.   ______________________________________________________________________  Please utilize this referral for viewing purposes as it will serve as the central location for all relevant documentation and updates.

## 2018-12-02 NOTE — Unmapped (Signed)
Called and spoke with patient today after receiving Humira approval.  She is hesitant to start for unclear reasons. Explained to her the reasoning behind the switch in terms of her new onset arthralgias that could be associated with infliximab.  Reviewed pathology result from colonoscopy.  Infliximab trough and antibody level are still pending.  Patient would prefer to wait for those results before making a final decision in terms of the switch to Humira.       [facility] will reach out to patient to coordinate delivery when ready.  Patient has heard from the IV nurse ambassador and knows to keep that contact #4 hands-on teaching visit when ready to start.  Patient verbalized understanding and agreement with plan.

## 2018-12-08 LAB — INFLIXIMAB/INFLIX ANTIBODY

## 2018-12-10 MED ORDER — CELECOXIB 100 MG CAPSULE
ORAL_CAPSULE | Freq: Two times a day (BID) | ORAL | 3 refills | 0.00000 days | Status: CP
Start: 2018-12-10 — End: 2019-01-09

## 2018-12-10 NOTE — Unmapped (Signed)
Called patient. No antibodies to infliximab.  However, infliximab could still be causing the joint pains.  Three options:    1-add celebrex to infliximab  2-add methotrexate to infliximab  3-change infliximab to humira    I discussed pros and cons of each approach.  Patient chose #1.  I asked her to update me in 1 week with how she is doing.

## 2018-12-15 NOTE — Unmapped (Signed)
Per 3/18 TC from Dr. Stevphen Rochester, will hold switch from remicade to humira for now.  [facility] has put script on hold.

## 2018-12-17 NOTE — Unmapped (Signed)
12/17/18 1456 Patient called for screening for upcoming appointment. Travel history is negative. She denies known exposure to a positive COVID case. She denies fever or recent infections. States occasional cough due to allergies. Informed her she would have to wear a mask if coughing in infusion center.

## 2018-12-18 ENCOUNTER — Ambulatory Visit: Admit: 2018-12-18 | Discharge: 2018-12-19 | Payer: MEDICARE

## 2018-12-18 DIAGNOSIS — K51019 Ulcerative (chronic) pancolitis with unspecified complications: Principal | ICD-10-CM

## 2018-12-18 LAB — CBC W/ AUTO DIFF
BASOPHILS ABSOLUTE COUNT: 0.1 10*9/L (ref 0.0–0.1)
BASOPHILS RELATIVE PERCENT: 1.2 %
EOSINOPHILS ABSOLUTE COUNT: 0.1 10*9/L (ref 0.0–0.4)
EOSINOPHILS RELATIVE PERCENT: 2.8 %
HEMATOCRIT: 36.7 % (ref 36.0–46.0)
HEMOGLOBIN: 11.6 g/dL — ABNORMAL LOW (ref 12.0–16.0)
LYMPHOCYTES RELATIVE PERCENT: 34.1 %
MEAN CORPUSCULAR HEMOGLOBIN CONC: 31.7 g/dL (ref 31.0–37.0)
MEAN CORPUSCULAR HEMOGLOBIN: 25.7 pg — ABNORMAL LOW (ref 26.0–34.0)
MEAN CORPUSCULAR VOLUME: 81 fL (ref 80.0–100.0)
MEAN PLATELET VOLUME: 7.1 fL (ref 7.0–10.0)
MONOCYTES ABSOLUTE COUNT: 0.3 10*9/L (ref 0.2–0.8)
MONOCYTES RELATIVE PERCENT: 7 %
NEUTROPHILS ABSOLUTE COUNT: 2.1 10*9/L (ref 2.0–7.5)
NEUTROPHILS RELATIVE PERCENT: 50.1 %
PLATELET COUNT: 296 10*9/L (ref 150–440)
RED BLOOD CELL COUNT: 4.53 10*12/L (ref 4.00–5.20)
RED BLOOD CELL COUNT: 4.53 10*12/L — ABNORMAL HIGH (ref 4.00–5.20)
WBC ADJUSTED: 4.3 10*9/L — ABNORMAL LOW (ref 4.5–11.0)

## 2018-12-18 NOTE — Unmapped (Signed)
1321 Patient present for Remicade infusion.  Pt states that she has been waiting since 1255 and wanted to know why it took so long to be brought back.  Explained to pt that she had not been checked as nurse ready by registration staff until [phone] and apologized for her wait.  Presents in stable condition without any recent infections.  Vitals stable.  PIV inserted.    1336  Questioned pt about Solumedrol allergy.  She stated that she was allergic to Prednisone.  Per Dr Kerry Kass note, pt has tolerated Solumedrol infusion in the past.  Solumedrol given with Zyrtec.  1338 Pt c/o itching at IV site.  Explained to pt that it was most likely from the Solumedrol and we would continue to watch it.  Explained to her that I would make a note in her chart and notify Dr Stevphen Rochester to try her infusion without it next time.  1338 Remicade 350  mg started to infuse as follows:  32ml/hr x 15 min  42ml/hr x 15 min  57ml/hr x 15 min  29ml/hr x 15 min  161ml/hr x 30 min  260ml/hr for the remainder of the infusion.  1355  Pt c/o IV site hurting.  Per Barb Merino, heat pack given, but pt denied that it helped any and wanted IV site moved.  IV was not infiltrated IV restarted in RA by Barb Merino per pt request for pt comfort.  Explained to pt that the Solumedrol was now in her system and not just in her one vein and to please let me know if the itching got worse.  Pt verbalized understanding.  1420  Pt still c/o itching, not worse and feeling a little restless.  No hives or rash noted.  Will continue to monitor.  Pt up to restroom.  1538 Remicade infusion complete.  Patient tolerated infusion.  Vitals stable. PIV flushed with NS.  1541 NS flush complete.  IV d/c'd.  Patient discharged.

## 2018-12-19 DIAGNOSIS — K51019 Ulcerative (chronic) pancolitis with unspecified complications: Principal | ICD-10-CM

## 2018-12-22 DIAGNOSIS — L932 Other local lupus erythematosus: Principal | ICD-10-CM

## 2018-12-22 DIAGNOSIS — K51019 Ulcerative (chronic) pancolitis with unspecified complications: Principal | ICD-10-CM

## 2018-12-22 DIAGNOSIS — T50905A Adverse effect of unspecified drugs, medicaments and biological substances, initial encounter: Principal | ICD-10-CM

## 2018-12-22 NOTE — Unmapped (Signed)
I had an 18 minute phone conversation today.  Discussed positive ANA 1:640.  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  I discussed pros and cons of following options:    -Humira  -Stelara  Diamond Mcconnell    Though her UC responded well to infliximab and therefore might be well-treated with Humira, there is a risk of drug induced lupus with Humira too (though lower risk than infliximab-0.1% vs. 0.2%).  I suggested we try stelara next.  Risks of infection and malignancy were reviewed.  Patient agreeable to starting.  Will initiate prior authorization. If flare during stelara, then consider Xeljanz or Humira.

## 2018-12-23 DIAGNOSIS — K51019 Ulcerative (chronic) pancolitis with unspecified complications: Principal | ICD-10-CM

## 2018-12-23 NOTE — Unmapped (Signed)
Per Dr. 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  Stelara therapy plan placed for IV infusion.  Will plan to send SQ dose to [facility] once IV dose has been received.  Will need to discuss copay card information with patient as well.

## 2018-12-23 NOTE — Unmapped (Signed)
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  I talked to her on the phone about this.  Can you please switch her from remicade to stelara? She's awaiting instructions.    Thanks,  Cletis Athens  ----- Message -----  From: Background User Lab  Sent: 12/18/2018   2:05 PM EDT  To: Luanne Bras, MD

## 2018-12-24 LAB — ANTI-DNA ANTIBODY, DOUBLE-STRANDED: DS-DNA TITER: 1:80 {titer}

## 2018-12-30 NOTE — Unmapped (Signed)
Request sent to MAP team for update on stelara PA.

## 2019-01-01 NOTE — Unmapped (Signed)
Was able to discuss approval with patient today along with copay assistance that she was encouraged to sign up for. Link sent.     Discussed timing of IV and SQ dosing on every 8week schedule.  She will be due for first SQ dose week of June 15.     Patient verbalized understanding and in agreement with plan.

## 2019-01-01 NOTE — Unmapped (Signed)
Approval received. TIC notified to call to schedule patient. Infusion scheduled for 01/12/2019.

## 2019-01-15 ENCOUNTER — Ambulatory Visit: Admit: 2019-01-15 | Discharge: 2019-01-16 | Payer: MEDICARE

## 2019-01-15 DIAGNOSIS — K51019 Ulcerative (chronic) pancolitis with unspecified complications: Principal | ICD-10-CM

## 2019-02-03 MED ORDER — STELARA 90 MG/ML SUBCUTANEOUS SYRINGE
5 refills | 0 days | Status: CP
Start: 2019-02-03 — End: 2019-05-15
  Filled 2019-03-10: qty 1, 30d supply, fill #0

## 2019-02-03 MED ORDER — EMPTY CONTAINER
PRN refills | 0 days
Start: 2019-02-03 — End: ?

## 2019-02-03 NOTE — Unmapped (Signed)
SQ dose sent to [facility] for processing and fill.

## 2019-02-06 NOTE — Unmapped (Signed)
[facility] Specialty Medication Referral: PA Approved      Medication (Brand/Generic): [facility]    Final Test Claim completed with resulted information below:    Patient ABLE to fill at [facility]  Insurance Company:  [employer]  Anticipated Copay: $0  Is anticipated copay with a copay card or grant? No, there is no need for grant or copay assistance.     Does patient's insurance plan only allow a 15 day supply for the first 6 fills in the [place] Program? NO  If yes, inform patient they can request to dis-enroll from the [facility] by calling the patient help desk at NOT APPLICABLE.      If the copay is under the $25 defined limit, per policy there will be no further investigation of need for financial assistance at this time unless patient requests. This referral has been communicated to the provider and handed off to the [facility] team for further processing and filling of prescribed medication.   ______________________________________________________________________  Please utilize this referral for viewing purposes as it will serve as the central location for all relevant documentation and updates.

## 2019-02-26 ENCOUNTER — Institutional Professional Consult (permissible substitution): Admit: 2019-02-26 | Discharge: 2019-02-27 | Payer: MEDICARE | Attending: Gastroenterology | Primary: Gastroenterology

## 2019-02-26 NOTE — Unmapped (Addendum)
-  Continue prednisone taper  -Increase Celebrex to 100 mg by mouth twice daily.  If no relief in joint pains after 1 week, notify my office and we may increase your prednisone taper.  -Continue with plans for Stelara injections.  I will have my office contact you to confirm the plan for home injection.  -Follow-up with primary care doctor regarding memory difficulties  -Clinic with me in 3 months or sooner if needed.

## 2019-02-26 NOTE — Unmapped (Signed)
PRIMARY PHYSICIAN:  Laurian Brim, MD    REASON FOR VISIT:  Ulcerative colitis    HISTORY OF PRESENT ILLNESS:  Since last visit, patient had colonoscopy in March that showed scarring but no active IBD.  Unfortunately, she was experiencing severe joint pains at that time.  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  Remicade was therefore switched to Stelara.  She is received her IV loading dose and is due for her first subcutaneous dose later this month.  No problems with the Stelara so far.  To help with her joint pains, I had prescribed prednisone.  The prednisone has helped.  However, she tapered to 5 mg 3 days ago and just today experienced worsening right hand pain.  No other joints are bothering her.  She is only taking the Celebrex once daily rather than twice daily.  1bm per day. No urgency or bleeding or abdominal pain.  Feels like colitis is in remission.  She has been experiencing what she describes as memory difficulties.    MEDICATIONS:  has a current medication list which includes the following prescription(s): citalopram, clonazepam, diphenhydramine, omeprazole, trazodone, triamterene-hydrochlorothiazide, and stelara, and the following Facility-Administered Medications: famotidine.    ALLERGIES:  Allergies as of 02/26/2019 - Reviewed 11/26/2018   Allergen Reaction Noted   ??? Mesalamine Other (See Comments)    ??? Amoxicillin Other (See Comments) 05/25/2008   ??? Metronidazole Nausea And Vomiting 03/09/2012   ??? Trazodone Other (See Comments) 05/03/2016   ??? Prednisone Itching        PAST MEDICAL HISTORY:  1.  Panulcerative colitis.  Diagnosed 1981.  Asacol started.  Within a few days, hospitalized for pancreatitis.  Asacol stopped.  Oral mesalamine never revisited.  Canasa and Rowasa enemas were tolerated and resulted in partial relief of symptoms.  Briefly took prednisone on one occasion and it caused weight gain, hair loss, insomnia.  Uceris helped colitis symptoms but was too expensive to continue.  07/2017, colitis flare.  Sulfasalazine tried without success.  03/19/2018, start Uceris.  Minimal relief.  05/01/2018, start Entyvio.  Continued flare.  05/08/2018, start prednisone.  05/14/2018, hospitalized for flare.  IV steroids and discharged on oral prednisone.  05/23/2018, start infliximab 5 mg/kg.  08/28/2018, steroid free remission.  11/2018, severe joint pains.  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  Change infliximab to ustekinumab.  Prednisone taper and Celebrex as needed.  2.  Restless leg syndrome  3.  History of C. difficile colitis  4.  Anxiety  5.  Heartburn  6.  Hiatal hernia  7.  08/28/2018, PCV 13    SOCIAL HISTORY:  Works in office support at [employer]  Does not smoke    FAMILY HISTORY:  No colon cancer or IBD    PHYSICAL EXAM:  There were no vitals taken for this visit.  Wt Readings from Last 4 Encounters:   01/15/19 73.9 kg (163 lb 0.5 oz)   12/18/18 73.8 kg (162 lb 11.2 oz)   11/26/18 72.6 kg (160 lb)   10/23/18 72.9 kg (160 lb 11.5 oz)   Conversational.    TEST DATA:  1.  11/21/2015, colonoscopy secondhand report showed pancolitis with biopsy showing chronic active colitis  2.  12/28/2016, DEXA scan.  Lumbar spine T= -2.9, left hip T= -1.9, left femoral neck T= -2.2  3.  01/03/2018, CBC, CMP normal.  Iron saturation 36, ferritin 31, CRP 1.7 mg/L, IgA 422, TTG IgA negative, 25 vitamin D 25.1, ESR 44  4.  01/23/2018, GI pathogen panel negative, C. difficile toxin negative, fecal calprotectin 428  5.  02/18/2018, colonoscopy to transverse colon.  Moderate erythema, granularity, edema from anus to hepatic flexure.  I personally reviewed color images and agree.  6.  05/05/2018, right upper quadrant ultrasound showed no evidence of stones or biliary ductal dilatation.  7.  05/05/2018, GI pathogen panel, C. difficile toxin negative.  8.  05/14/2018, CT abdomen and pelvis.  Mild thickening and enhancement with prominent vasa recta from the rectum to a sending colon.  9.  05/16/2018, EGD.  Large paraesophageal hiatal hernia with a few Cameron's erosions.  Otherwise normal.  10.  05/16/2018, sigmoidoscopy to 45 cm.  Fair bowel prep.  Severe ulcers and spontaneous bleeding from the rectum to sigmoid colon consistent with [facility] 3 disease.  11.  05/16/2018, hepatitis B surface antigen negative, hepatitis C antibody negative, PPD negative.  12.  07/04/2018, trough infliximab level 12  13.  11/25/2018, colonoscopy to terminal ileum x7 cm.  Normal ileum.  Patchy scarring and pseudopolyps in transverse and proximal descending colon.  Continuous scarring and tubular colon from rectum to proximal descending colon.  No active IBD.  Chromocolonoscopy negative for dysplastic features.  14.  12/18/2018, ANA>1:640, anti-dsDNA Ab 1:80, CRP less than 5 mg/L    ASSESSMENT:  1.  Panulcerative colitis.  Currently in symptomatic remission on Stelara monotherapy and low-dose prednisone.  2.  History of mesalamine associated pancreatitis.  Given the temporal association that she describes, I think it is likely that the mesalamine caused the pancreatitis rather than a coincidence.  Therefore, would not recommend using oral mesalamine in the future because of the risk of recurrent pancreatitis.  3.  Osteoporosis.  Likely exacerbated by steroid use.  It does not sound like patient has seen anyone to initiate treatment for this.  I would recommend that she talk with her primary care doctor about treating this.  Try to minimize steroid exposure in the future.  4.  Chronic heartburn.  Well controlled on omeprazole.  Continue this.  If symptoms become refractory, then could consider fundoplication given her large hiatal hernia.  5.  Joint pains.  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  Recommend increasing Celebrex to 100 mg twice daily for now.  If no relief, then consider extending the prednisone taper.  Otherwise, continue tapering prednisone as previously directed.  Hopefully, this will subside with avoidance of infliximab.  6.  Memory difficulties.  I do not think this is related to her medications.  Follow-up with primary care doctor.    RECOMMENDATIONS AND PLAN:  Patient Instructions   -Continue prednisone taper  -Increase Celebrex to 100 mg by mouth twice daily.  If no relief in joint pains after 1 week, notify my office and we may increase your prednisone taper.  -Continue with plans for Stelara injections.  I will have my office contact you to confirm the plan for home injection.  -Follow-up with primary care doctor regarding memory difficulties  -Clinic with me in 3 months or sooner if needed.      I spent 12 minutes on the phone with the patient. I spent an additional 10 minutes on pre- and post-visit activities.     The patient was physically located in [place] or a state in which I am permitted to provide care. The patient and/or parent/gauardian understood that s/he may incur co-pays and cost sharing, and agreed to the telemedicine visit. The visit was completed via phone  and/or video, which was appropriate and reasonable under the circumstances given the patient's presentation at the time.    The patient and/or parent/guardian has been advised of the potential risks and limitations of this mode of treatment (including, but not limited to, the absence of in-person examination) and has agreed to be treated using telemedicine. The patient's/patient's family's questions regarding telemedicine have been answered.     If the phone/video visit was completed in an ambulatory setting, the patient and/or parent/guardian has also been advised to contact their provider???s office for worsening conditions, and seek emergency medical treatment and/or call 911 if the patient deems either necessary.

## 2019-03-02 NOTE — Unmapped (Signed)
[facility]   Patient Onboarding/Medication Counseling    Diamond Mcconnell is a 63 y.o. female with ulcerative colitis who I am counseling today on initiation of therapy.  I am speaking to the patient.    Verified patient's date of birth / HIPAA.    Specialty medication(s) to be sent: Inflammatory Disorders: Stelara      Non-specialty medications/supplies to be sent: sharps kit       Stelara (ustekinumab)    Medication & Administration     Dosage: Ulcerative colitis: Inject 90mg  under the skin every 8 weeks starting 8 weeks after IV induction dose    Lab tests required prior to treatment initiation:  ? Tuberculosis: Tuberculosis screening resulted in a non-reactive TB skin test.    Administration:     Prefilled syringe  1. Gather all supplies needed for injection on a clean, flat working surface: medication syringe(s) removed from packaging, alcohol swab, sharps container, etc.  2. Look at the medication label ??? look for correct medication, correct dose, and check the expiration date  3. Look at the medication ??? the liquid in the syringe should appear clear and colorless to slightly yellow, you may see a few white particles  4. Lay the syringe on a flat surface and allow it to warm up to room temperature for at least 15-30 minutes  5. Select injection site ??? you can use the front of your thigh or your belly (but not the area 2 inches around your belly button); if someone else is giving you the injection you can also use your upper arm in the skin covering your triceps muscle or in the buttocks  6. Prepare injection site ??? wash your hands and clean the skin at the injection site with an alcohol swab and let it air dry, do not touch the injection site again before the injection  7. Pull off the needle safety cap, do not remove until immediately prior to injection  8. Pinch the skin ??? with your hand not holding the syringe pinch up a fold of skin at the injection site using your forefinger and thumb  9. Insert the needle into the fold of skin at about a 45 degree angle ??? it's best to use a quick dart-like motion  10. Push the plunger down slowly as far as it will go until the syringe is empty, if the plunger is not fully depressed the needle shield will not extend to cover the needle when it is removed, hold the syringe in place for a full 5 seconds  11. Check that the syringe is empty and keep pressing down on the plunger while you pull the needle out at the same angle as inserted; after the needle is removed completely from the skin, release the plunger allowing the needle shield to activate and cover the used needle  12. Dispose of the used syringe immediately in your sharps disposal container, do not attempt to recap the needle prior to disposing  13. If you see any blood at the injection site, press a cotton ball or gauze on the site and maintain pressure until the bleeding stops, do not rub the injection site      Adherence/Missed dose instructions:  If your injection is given more than 7-10 days after your scheduled injection date ??? consult your pharmacist for additional instructions on how to adjust your dosing schedule.    Goals of Therapy     ? Achieve remission of symptoms  ? Maintain  remission of symptoms  ? Minimize long-term systemic glucocorticoid use  ? Prevent need for surgical procedures  ? Maintenance of effective psychosocial functioning      Side Effects & Monitoring Parameters     ? Injection site reaction (redness, irritation, inflammation localized to the site of administration)  ? Signs of a common cold ??? minor sore throat, runny or stuffy nose, etc.  ? Feeling tired or weak  ? Headache    The following side effects should be reported to the provider:  ? Signs of a hypersensitivity reaction ??? rash; hives; itching; red, swollen, blistered, or peeling skin; wheezing; tightness in the chest or throat; difficulty breathing, swallowing, or talking; swelling of the mouth, face, lips, tongue, or throat; etc.  ? Reduced immune function ??? report signs of infection such as fever; chills; body aches; very bad sore throat; ear or sinus pain; cough; more sputum or change in color of sputum; pain with passing urine; wound that will not heal, etc.  Also at a slightly higher risk of some malignancies (mainly skin and blood cancers) due to this reduced immune function.  o In the case of signs of infection ??? the patient should hold the next dose of Stelara?? and call your primary care provider to ensure adequate medical care.  Treatment may be resumed when infection is treated and patient is asymptomatic.  ? Changes in skin ??? a new growth or lump that forms; changes in shape, size, or color of a previous mole or marking  ? Shortness of breath or chest pain  ? Vaginal itching or discharge      Contraindications, Warnings, & Precautions     ? Have your bloodwork checked as you have been told by your prescriber  ? Talk with your doctor if you are pregnant, planning to become pregnant, or breastfeeding  ? Discuss the possible need for holding your dose(s) of Stelara?? when a planned procedure is scheduled with the prescriber as it may delay healing/recovery timeline       Drug/Food Interactions     ? Medication list reviewed in Epic. The patient was instructed to inform the care team before taking any new medications or supplements. No drug interactions identified.   ? If you have a latex allergy use caution when handling, the needle cap of the Stelara?? prefilled syringe contains a derivative of natural rubber latex  ? Talk with you prescriber or pharmacist before receiving any live vaccinations while taking this medication and after you stop taking it    Storage, Handling Precautions, & Disposal     ? Store this medication in the refrigerator.  Do not freeze  ? If needed, you may store at room temperature for up to 30 days  ? Store in [employer], protected from light  ? Do not shake  ? Dispose of used syringes/pens in a sharps disposal container    ** Due to not having time to complete the conversation, the patient declined the following portions of the counseling outlined above: administration; adherence / missed dose instructions; goals of therapy; drug/food interactions; storage, handling precautions, and disposal.  She stated she will call with any questions and will consult with her doctor as needed. **      Current Medications (including OTC/herbals), Comorbidities and Allergies     Current Outpatient Medications   Medication Sig Dispense Refill   ??? celecoxib (CELEBREX) 100 MG capsule Take 100 mg by mouth daily.     ??? citalopram (CELEXA) 40  MG tablet TAKE 1 TABLET BY MOUTH ONCE DAILY     ??? diphenhydrAMINE (BENADRYL) 25 mg capsule/tablet Take 25 mg by mouth every six (6) hours as needed for itching.     ??? omeprazole (PRILOSEC) 20 MG capsule TAKE 1 CAPSULE BY MOUTH EVERY DAY 30 MINUTES BEFORE BREAKFAST 90 capsule 1   ??? traZODone (DESYREL) 50 MG tablet Take 50 mg by mouth.     ??? ustekinumab (STELARA) 90 mg/mL Syrg syringe Inject the contents of 1 pen (90mg ) under the skin once every 8 weeks. 1 mL 5     Current Facility-Administered Medications   Medication Dose Route Frequency Provider Last Rate Last Dose   ??? famotidine (PEPCID) tablet 10 mg  10 mg Oral Once Lendell Caprice, RN           Allergies   Allergen Reactions   ??? Mesalamine Other (See Comments)     Pancreatitis   ??? Amoxicillin Other (See Comments)     unknown   ??? Metronidazole Nausea And Vomiting     Abdominal pain, dysgeusia   ??? Trazodone Other (See Comments)     Severe back pack with muscle aches   ??? Prednisone Itching     Currently on prednisone and tolerating        Patient Active Problem List   Diagnosis   ??? Ulcerative pancolitis with complication (CMS-HCC)   ??? Multinodular goiter   ??? Nephrolithiasis   ??? Obstructive sleep apnea   ??? Stress incontinence in female   ??? Other osteoporosis without current pathological fracture   ??? Syncope   ??? Depression   ??? Drug-induced lupus erythematosus       Current medications and documented allergies were not able to be reviewed on today's call due to the patient ending the conversation.  They have been reviewed as listed in Epic.    Appropriateness of Therapy     Is medication and dose appropriate based on diagnosis? Yes    Baseline Quality of Life Assessment      How many days over the past month did your ulcerative colitis keep you from your normal activities? Ms. Coggin was unable to quantify this    Financial Information     Medication Assistance provided: Prior Authorization    Anticipated copay of $0.00 reviewed with patient. Verified delivery address.    Delivery Information     Scheduled delivery date: 03/10/2019    Expected start date: 03/12/2019    Medication will be delivered via Same Day Courier to the home address in [place].  This shipment will not require a signature.      Explained the services we provide at [facility] and that each month we would call to set up refills.  Stressed importance of returning phone calls so that we could ensure they receive their medications in time each month.  Informed patient that we should be setting up refills 7-10 days prior to when they will run out of medication.  A pharmacist will reach out to perform a clinical assessment periodically.  Informed patient that a welcome packet and a drug information handout will be sent.      Patient verbalized understanding of the above information as well as how to contact the pharmacy at [phone] option 4 with any questions/concerns.  The pharmacy is open Monday through Friday 8:30am-4:30pm.  A pharmacist is available 24/7 via pager to answer any clinical questions they may have.    Patient Specific  Needs     ? Does the patient have any physical, cognitive, or cultural barriers? No    ? Patient prefers to have medications discussed with  Patient     ? Is the patient able to read and understand education materials at a high school level or above? Yes    ? Patient's primary language is  English     ? Is the patient high risk? No     ? Does the patient require a Care Management Plan? No     ? Does the patient require physician intervention or other additional services (i.e. nutrition, smoking cessation, social work)? No      Karene Fry Akya Fiorello  [facility] Specialty Pharmacist

## 2019-03-04 NOTE — Unmapped (Signed)
LM for patient today explaining that she will hear from [facility] to coordinate delivery of stelara for dose due next week.  Will need to work on getting her access to the self administration video and/or reviewing by phone.  Will try her again.

## 2019-03-09 NOTE — Unmapped (Signed)
[facility] has tried to reach patient [phone] without success.  Left her additional voice message today, sent mychart message and urgent email requesting that she follow up.

## 2019-03-10 MED FILL — STELARA 90 MG/ML SUBCUTANEOUS SYRINGE: 30 days supply | Qty: 1 | Fill #0 | Status: AC

## 2019-03-10 MED FILL — EMPTY CONTAINER: 30 days supply | Qty: 1 | Fill #0 | Status: AC

## 2019-03-10 MED FILL — EMPTY CONTAINER: 30 days supply | Qty: 1 | Fill #0

## 2019-03-13 LAB — HM MAMMOGRAPHY

## 2019-03-16 NOTE — Unmapped (Signed)
Patient was able to self inject first SQ stelara dose successfully and will proceed as ordered every 8weeks.  She will update prn.

## 2019-03-24 LAB — HM DEXA SCAN

## 2019-04-03 MED ORDER — CELECOXIB 100 MG CAPSULE
ORAL_CAPSULE | 0 refills | 0 days | Status: CP
Start: 2019-04-03 — End: 2019-05-04

## 2019-04-23 NOTE — Unmapped (Signed)
Spoke with pt today. She was requesting a fill of her stelara due on  8/20 now prior to her commercial insurance expiring at the end of July.   Reached out to [employer] - unfortunetly the claim is stating too soon to fill and she will not be able get a dose in July with her currnet insurance.   She provided me with her new medicare insurance starting August 1st:     [employer] [place] medicare hmo -   Member ID [id number]    Discussed options of applying for mfr assistance or applying for assistance with [employer]. Will have pharmacy run benefits investigation on Monday 8/3 to start process. [place]. Updated as well

## 2019-04-27 NOTE — Unmapped (Signed)
Per test claim for [facility], patient needs Medication Assistance Program for High Copay OF $3217.80 ON NEW INSURANCE.

## 2019-04-30 NOTE — Unmapped (Signed)
Patient has switched to Medicare as of 04/25/2019 which unfortunately causes coverage issues with stelara.  She will apply for JJPAF and request also sent to [employer] for assist. Patient updated.

## 2019-05-01 ENCOUNTER — Encounter: Payer: Self-pay | Admitting: Nurse Practitioner

## 2019-05-04 ENCOUNTER — Other Ambulatory Visit: Payer: Self-pay

## 2019-05-04 ENCOUNTER — Encounter: Payer: Self-pay | Admitting: Nurse Practitioner

## 2019-05-04 ENCOUNTER — Ambulatory Visit (INDEPENDENT_AMBULATORY_CARE_PROVIDER_SITE_OTHER): Payer: Self-pay | Admitting: Nurse Practitioner

## 2019-05-04 VITALS — BP 111/75 | HR 49 | Temp 98.5°F | Ht 62.3 in | Wt 159.0 lb

## 2019-05-04 DIAGNOSIS — F330 Major depressive disorder, recurrent, mild: Secondary | ICD-10-CM

## 2019-05-04 DIAGNOSIS — K449 Diaphragmatic hernia without obstruction or gangrene: Secondary | ICD-10-CM

## 2019-05-04 DIAGNOSIS — K5100 Ulcerative (chronic) pancolitis without complications: Secondary | ICD-10-CM

## 2019-05-04 MED ORDER — CELECOXIB 100 MG CAPSULE
ORAL_CAPSULE | 0 refills | 0 days | Status: CP
Start: 2019-05-04 — End: ?

## 2019-05-04 NOTE — Assessment & Plan Note (Signed)
Chronic, ongoing with use of Citalopram and Trazodone.  Continue current medication regimen and adjust as needed.  Denies SI/HI.

## 2019-05-04 NOTE — Patient Instructions (Signed)
Ulcerative Colitis, Adult  Ulcerative colitis is long-lasting (chronic) inflammation of the large intestine (colon) and rectum. Sores (ulcers) may also form in these areas. Ulcerative colitis, along with a closely related condition called Crohn's disease, is often referred to as inflammatory bowel disease (IBD). What are the causes? This condition may be caused by increased activity of the immune system in the intestines. The immune system is the system that protects the body against harmful bacteria, viruses, fungi, and other things that can make you sick. The cause of the increased activity of the immune system is not known. What increases the risk? The following factors may make you more likely to develop this condition:  Being 43-47 years old. The risk is also increased for people who are 31-108 years old.  Having a family history of ulcerative colitis.  Being of Jewish descent. What are the signs or symptoms? Symptoms vary depending on how severe the condition is. Common symptoms include:  Rectal bleeding.  Diarrhea, often with blood or pus in the stool. Other symptoms can include:  Pain or cramping in the abdomen.  Fever.  Fatigue.  Weight loss.  Night sweats.  Rectal pain.  A strong and sudden need to have a bowel movement (bowel urgency).  Nausea.  Loss of appetite.  Anemia.  Yellowing of the skin (jaundice) from liver dysfunction.  Joint pain or soreness.  Eye irritation.  Skin rashes. Symptoms can range from mild to severe. They may come and go. How is this diagnosed? This condition may be diagnosed based on:  Your symptoms and medical history.  A physical exam.  Tests, including: ? Blood tests and stool tests. ? X-ray. ? A CT scan. ? An MRI. ? Colonoscopy. For this test, a flexible tube is inserted into your anus, and your colon is examined. ? Biopsy. In this test, a tissue sample is taken from your colon and examined under a microscope. How  is this treated? Treatment for this condition may include medicines to:  Decrease swelling and inflammation.  Control your immune system.  Treat infections.  Relieve pain.  Control diarrhea. Severe flare-ups may need to be treated at a hospital. Treatment in a hospital may involve:  Resting the bowel. This involves not eating or drinking for a period of time.  Getting medicines through an IV.  Getting fluids and nutrition through: ? An IV. ? A tube that is passed through the nose and into the stomach (nasogastric tube, or NG tube).  Surgery to remove the affected part of the colon. This may be done if other treatments are not helping. This condition increases the risk of colon cancer. Adults with this condition will need to be watched for colon cancer throughout life. Follow these instructions at home: Medicines and vitamins  Take over-the-counter and prescription medicines only as told by your health care provider. Do not take aspirin.  If you were prescribed an antibiotic medicine, take it as told by your health care provider. Do not stop taking the antibiotic even if you start to feel better.  Ask your health care provider if you should take any vitamins or supplements. You may need to take: ? Calcium and vitamin D for bone health. ? Iron to help treat anemia. Lifestyle  Exercise regularly.  Work with your health care provider to manage your condition and educate yourself about your condition.  Do not use any products that contain nicotine or tobacco, such as cigarettes, e-cigarettes, and chewing tobacco. If you need help quitting,  ask your health care provider.  If you drink alcohol: ? Limit how much you use to:  0-1 drink a day for women.  0-2 drinks a day for men. ? Be aware of how much alcohol is in your drink. In the [place] one drink equals one 12 oz bottle of beer (355 mL), one 5 oz glass of wine (148 mL), or one 1 oz glass of hard liquor (44 mL). Eating and  drinking  Drink enough fluid to keep your urine pale yellow.  Ask your health care provider about the best diet for you. Follow the diet as told by your health care provider. This may include: ? Avoiding carbonated drinks. ? Avoiding popcorn, vegetable skins, nuts, and other high-fiber foods. ? Avoiding high-fat foods. ? Eating smaller meals more often. ? Limiting your intake of sugary drinks. ? Limiting your caffeine intake.  Follow food safety recommendations as told by your health care provider. This may include making sure you: ? Avoid eating raw or undercooked meat, fish, or eggs. ? Do not eat or drink spoiled or expired foods and drinks.  Keep a food diary. This may help you identify and avoid any foods that trigger your symptoms. General instructions  Wash your hands often with soap and water. If soap and water are not available, use hand sanitizer.  Stay up to date on your vaccinations, including a yearly (annual) flu shot. Ask your health care provider which vaccines you should get.  Follow recommendations from your health care provider for having cancer screening tests. Ulcerative colitis may place you at increased risk for colon cancer.  Keep all follow-up visits as told by your health care provider. This is important. Contact a health care provider if:  Your symptoms do not improve or they get worse with treatment.  You continue to lose weight.  You have constant cramps or loose stools.  You develop a new skin rash, skin sores, or eye problems.  You have a fever or chills. Get help right away if:  You have bloody diarrhea.  You have severe bleeding from the rectum.  You feel that your heart is racing (tachycardia).  You have severe pain in your abdomen.  Your abdomen swells (abdominal distension).  Your abdomen is tender to the touch.  You vomit. Summary  Ulcerative colitis is long-lasting (chronic) inflammation of the large intestine (colon) and  rectum. Sores (ulcers) may also form in these areas.  Follow instructions from your health care provider about medicines, lifestyle changes, and eating and drinking.  Contact your health care provider if symptoms do not improve or they get worse with treatment.  Get help right away if you have severe abdominal pain, abdominal swelling, or severe bleeding from the rectum.  Keep all follow-up visits as told by your health care provider. This is important. This information is not intended to replace advice given to you by your health care provider. Make sure you discuss any questions you have with your health care provider. Document Released: 06/20/2005 Document Revised: 06/30/2018 Document Reviewed: 07/02/2018 Elsevier Patient Education  2020 [employer].

## 2019-05-04 NOTE — Unmapped (Signed)
Refilled Celebrex. Was seen in clinic in June and celebrex was increased to 100mg  BID, pending follow up 06/04/19.

## 2019-05-04 NOTE — Assessment & Plan Note (Signed)
Continue on Omeprazole for this.  Followed by [facility] GI team.  Obtain labs next visit.

## 2019-05-04 NOTE — Assessment & Plan Note (Signed)
Followed by [facility] GI team, on Stelara.  Continue collaboration and review notes when available.

## 2019-05-04 NOTE — Progress Notes (Signed)
New Patient Office Visit  Subjective:  Patient ID: Karla [name], female    DOB: Jan 13, 1956  Age: 63 y.o. MRN: [id number]  CC:  Chief Complaint  Patient presents with  . Establish Care    HPI Karla [name] presents for new patient visit to establish care.  Introduced to Designer, jewellery role and practice setting.  All questions answered.  She was a bit nervous about seeing nurse practitioner, endorsing she had requested to see physician in practice as had been referred to see them.  At end of visit she reported comfort seeing NP, but is aware if any concerns to discuss with provider and will determine alternate plan of care.  CHRONIC ULCERATIVE COLITIS/PANCOLITIS: Followed by [facility] GI team, has visit with them again 06/04/2019.  Was going for infusions and has started Stelara injections, every 8 weeks.  She reports some weight gain with this regimen being noted and endorses frustration with this.  Reports some reduction in frequency of loose stool.  Denies N&V, SOB, CP, or abdominal pain.  DEPRESSION Continues on Citalopram daily and Trazodone at night for sleep and mood.  Reports she once took Klonopin, but took herself off this as she did not like risks with it. Mood status: stable Satisfied with current treatment?: yes Symptom severity: mild  Duration of current treatment : chronic Side effects: no Medication compliance: good compliance Psychotherapy/counseling: none Previous psychiatric medications: Klonopin, Trazodone, Citalopram Anxious mood: yes Anhedonia: no Significant weight loss or gain: no Insomnia: yes hard to fall asleep Fatigue: no Feelings of worthlessness or guilt: no Impaired concentration/indecisiveness: yes Suicidal ideations: no Hopelessness: no Crying spells: no Depression screen [facility] 2/9 05/04/2019  Decreased Interest 0  Down, Depressed, Hopeless 0  PHQ - 2 Score 0  Altered sleeping 0  Tired, decreased energy 1  Change in appetite 0  Feeling bad or  failure about yourself  0  Trouble concentrating 1  Moving slowly or fidgety/restless 0  Suicidal thoughts 0  PHQ-9 Score 2  Difficult doing work/chores Not difficult at all    GAD 7 : Generalized Anxiety Score 05/04/2019  Nervous, Anxious, on Edge 0  Control/stop worrying 0  Worry too much - different things 0  Trouble relaxing 1  Restless 3  Easily annoyed or irritable 0  Afraid - awful might happen 0  Total GAD 7 Score 4  Anxiety Difficulty Somewhat difficult    HIATAL HERNIA: Takes Omeprazole for this per her report.  This helps with heart burn and she does not take any PRN medications.  Denies frequent belching or bloating being noticed.     Past Medical History:  Diagnosis Date  . GERD (gastroesophageal reflux disease)   . Insomnia   . Restless leg syndrome   . Ulcerative colitis ([place])   . Vertigo     Past Surgical History:  Procedure Laterality Date  . NECK SURGERY    . SHOULDER SURGERY Left     Family History  Problem Relation Age of Onset  . Congestive Heart Failure Mother   . COPD Mother   . Macular degeneration Mother   . Vitiligo Mother   . Arthritis Father   . Diabetes Father     Social History   Socioeconomic History  . Marital status: Single    Spouse name: Not on file  . Number of children: Not on file  . Years of education: Not on file  . Highest education level: Not on file  Occupational History  . Not on file  Social Needs  . Financial resource strain: Not on file  . Food insecurity    Worry: Not on file    Inability: Not on file  . Transportation needs    Medical: Not on file    Non-medical: Not on file  Tobacco Use  . Smoking status: Never Smoker  . Smokeless tobacco: Never Used  Substance and Sexual Activity  . Alcohol use: Never    Frequency: Never  . Drug use: Never  . Sexual activity: Not Currently  Lifestyle  . Physical activity    Days per week: Not on file    Minutes per session: Not on file  . Stress: Not on  file  Relationships  . Social Herbalist on phone: Not on file    Gets together: Not on file    Attends religious service: Not on file    Active member of club or organization: Not on file    Attends meetings of clubs or organizations: Not on file    Relationship status: Not on file  . Intimate partner violence    Fear of current or ex partner: Not on file    Emotionally abused: Not on file    Physically abused: Not on file    Forced sexual activity: Not on file  Other Topics Concern  . Not on file  Social History Narrative  . Not on file    ROS Review of Systems  Constitutional: Negative for activity change, appetite change, diaphoresis, fatigue and fever.  Respiratory: Negative for cough, chest tightness and shortness of breath.   Cardiovascular: Negative for chest pain, palpitations and leg swelling.  Gastrointestinal: Negative for abdominal distention, abdominal pain, constipation, diarrhea, nausea and vomiting.  Neurological: Negative for dizziness, syncope, weakness, light-headedness, numbness and headaches.  Psychiatric/Behavioral: Negative.     Objective:   Today's Vitals: BP 111/75   Pulse (!) 49   Temp 98.5 F (36.9 C) (Oral)   Ht 5' 2.3" (1.582 m)   Wt 159 lb (72.1 kg)   SpO2 97%   BMI 28.80 kg/m   Physical Exam Vitals signs and nursing note reviewed.  Constitutional:      General: She is awake. She is not in acute distress.    Appearance: She is well-developed. She is not ill-appearing.  HENT:     Head: Normocephalic.     Right Ear: Hearing normal.     Left Ear: Hearing normal.     Nose: Nose normal.     Mouth/Throat:     Mouth: Mucous membranes are moist.  Eyes:     General: Lids are normal.        Right eye: No discharge.        Left eye: No discharge.     Conjunctiva/sclera: Conjunctivae normal.     Pupils: Pupils are equal, round, and reactive to light.  Neck:     Musculoskeletal: Normal range of motion and neck supple.      Thyroid: No thyromegaly.     Vascular: No carotid bruit.  Cardiovascular:     Rate and Rhythm: Normal rate and regular rhythm.     Heart sounds: Normal heart sounds. No murmur. No gallop.   Pulmonary:     Effort: Pulmonary effort is normal. No accessory muscle usage or respiratory distress.     Breath sounds: Normal breath sounds.  Abdominal:     General: Bowel sounds are normal.     Palpations: Abdomen is soft. There is no hepatomegaly  or splenomegaly.     Tenderness: There is no abdominal tenderness.  Musculoskeletal:     Right lower leg: No edema.     Left lower leg: No edema.  Lymphadenopathy:     Cervical: No cervical adenopathy.  Skin:    General: Skin is warm and dry.  Neurological:     Mental Status: She is alert and oriented to person, place, and time.  Psychiatric:        Attention and Perception: Attention normal.        Mood and Affect: Mood normal.        Behavior: Behavior normal. Behavior is cooperative.        Thought Content: Thought content normal.        Judgment: Judgment normal.     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  Continue collaboration and review notes when available.          Other   Depression - Primary    Chronic, ongoing with use of Citalopram and Trazodone.  Continue current medication regimen and adjust as needed.  Denies SI/HI.         Relevant Medications   traZODone (DESYREL) 50 MG tablet   Other Relevant Orders   Ambulatory referral to Chronic Care Management Services   Hiatal hernia    Continue on Omeprazole for this.  Followed by [facility] GI team.  Obtain labs next visit.         Outpatient Encounter Medications as of 05/04/2019  Medication Sig  . celecoxib (CELEBREX) 100 MG capsule TAKE 1 CAPSULE(100 MG) BY MOUTH TWICE DAILY  . citalopram (CELEXA) 40 MG tablet TAKE 1 TABLET BY MOUTH ONCE DAILY  .  traZODone (DESYREL) 50 MG tablet Take by mouth.  . Ustekinumab (STELARA [name]) Inject into the skin. Injection every 8 weeks  . omeprazole (PRILOSEC) 20 MG capsule Take by mouth.  . [DISCONTINUED] Black Pepper-Turmeric (TURMERIC COMPLEX/BLACK PEPPER) 3-500 MG CAPS Take by mouth.  . [DISCONTINUED] Budesonide ER 9 MG TB24   . [DISCONTINUED] clonazePAM (KLONOPIN) 0.5 MG tablet Take by mouth.  . [DISCONTINUED] HYDROcodone-acetaminophen (NORCO/VICODIN) 5-325 MG tablet Take 1 tablet by mouth every 8 (eight) hours as needed for moderate pain.  . [DISCONTINUED] Probiotic Product (PROBIOTIC-10) CAPS Take by mouth.  . [DISCONTINUED] triamterene-hydrochlorothiazide (DYAZIDE) 37.5-25 MG capsule Take by mouth.   No facility-administered encounter medications on file as of 05/04/2019.     Follow-up: Return in about 3 weeks (around 05/25/2019) for Follow-up.   [name], NP

## 2019-05-15 ENCOUNTER — Telehealth: Payer: Self-pay

## 2019-05-15 ENCOUNTER — Ambulatory Visit: Payer: Self-pay | Admitting: Pharmacist

## 2019-05-15 NOTE — Unmapped (Signed)
This patient has been disenrolled from the [facility] specialty pharmacy services due to no longer having an active prescription on file here at the [facility].  Notified by Alesia Banda, RN at the IBD clinic that this patient is sourcing their [facility] elsewhere now.Karene Fry Shanya Ferriss  [facility] Specialty Pharmacist

## 2019-05-15 NOTE — Chronic Care Management (AMB) (Signed)
  Chronic Care Management   Note  05/15/2019 Name: Karla [name] MRN: [id number] DOB: 02/13/1956  Karla [name] is a 63 y.o. year old female who is a primary care patient of Cannady, Barbaraann Faster, NP. The CCM team was consulted for assistance with chronic disease management and care coordination needs.    Contacted patient to discuss medication cost concerns. Left HIPAA compliant message for her to return my call at her convenience.   Follow up plan: - If I do not hear back, will outreach patient in the next 2-3 weeks  Catie Darnelle Maffucci, PharmD Clinical Pharmacist [place] [phone]

## 2019-05-22 ENCOUNTER — Ambulatory Visit: Payer: Self-pay | Admitting: Nurse Practitioner

## 2019-05-29 ENCOUNTER — Ambulatory Visit: Payer: Self-pay | Admitting: Pharmacist

## 2019-05-29 ENCOUNTER — Telehealth: Payer: Self-pay

## 2019-05-29 NOTE — Chronic Care Management (AMB) (Signed)
  Chronic Care Management   Note  05/29/2019 Name: Karla [name] MRN: [id number] DOB: [birth year]  [name] is a 63 y.o. year old female who is a primary care patient of Cannady, Barbaraann Faster, NP. The CCM team was consulted for assistance with chronic disease management and care coordination needs.    Contacted patient to discuss CCM services. She noted that she appreciated the services provided by [facility], but due to some other concerns about the clinic, she has decided to pursue care elsewhere. She was appreciative of the [name].   Catie Darnelle Maffucci, PharmD Clinical Pharmacist [place] [phone]

## 2019-06-04 ENCOUNTER — Telehealth: Admit: 2019-06-04 | Discharge: 2019-06-05 | Payer: MEDICARE | Attending: Gastroenterology | Primary: Gastroenterology

## 2019-06-04 DIAGNOSIS — M818 Other osteoporosis without current pathological fracture: Secondary | ICD-10-CM | POA: Diagnosis not present

## 2019-06-04 DIAGNOSIS — K51019 Ulcerative (chronic) pancolitis with unspecified complications: Secondary | ICD-10-CM

## 2019-06-04 DIAGNOSIS — R12 Heartburn: Secondary | ICD-10-CM | POA: Diagnosis not present

## 2019-06-04 DIAGNOSIS — G2581 Restless legs syndrome: Secondary | ICD-10-CM | POA: Diagnosis not present

## 2019-06-08 MED ORDER — OMEPRAZOLE 20 MG CAPSULE,DELAYED RELEASE
ORAL_CAPSULE | 1 refills | 0 days | Status: CP
Start: 2019-06-08 — End: ?

## 2019-06-16 MED ORDER — CELECOXIB 100 MG CAPSULE
ORAL_CAPSULE | Freq: Two times a day (BID) | ORAL | 4 refills | 30.00000 days | Status: CP
Start: 2019-06-16 — End: ?

## 2019-06-22 DIAGNOSIS — K5100 Ulcerative (chronic) pancolitis without complications: Secondary | ICD-10-CM | POA: Diagnosis not present

## 2019-06-22 DIAGNOSIS — R7301 Impaired fasting glucose: Secondary | ICD-10-CM | POA: Diagnosis not present

## 2019-06-22 DIAGNOSIS — F320 Major depressive disorder, single episode, mild: Secondary | ICD-10-CM | POA: Diagnosis not present

## 2019-06-22 DIAGNOSIS — R945 Abnormal results of liver function studies: Secondary | ICD-10-CM | POA: Diagnosis not present

## 2019-07-07 DIAGNOSIS — F5101 Primary insomnia: Secondary | ICD-10-CM | POA: Diagnosis not present

## 2019-07-07 DIAGNOSIS — R42 Dizziness and giddiness: Secondary | ICD-10-CM | POA: Diagnosis not present

## 2019-07-22 DIAGNOSIS — R262 Difficulty in walking, not elsewhere classified: Secondary | ICD-10-CM | POA: Diagnosis not present

## 2019-07-22 DIAGNOSIS — H8112 Benign paroxysmal vertigo, left ear: Secondary | ICD-10-CM | POA: Diagnosis not present

## 2019-10-26 ENCOUNTER — Ambulatory Visit: Admit: 2019-10-26 | Discharge: 2019-10-27 | Payer: MEDICARE | Attending: Family | Primary: Family

## 2019-10-26 DIAGNOSIS — Z20822 Contact with and (suspected) exposure to covid-19: Secondary | ICD-10-CM | POA: Diagnosis not present

## 2019-10-26 DIAGNOSIS — Z20828 Contact with and (suspected) exposure to other viral communicable diseases: Secondary | ICD-10-CM | POA: Diagnosis not present

## 2020-01-12 ENCOUNTER — Telehealth: Payer: Self-pay

## 2020-01-12 NOTE — Telephone Encounter (Signed)
Copied from Karla [name] [id number]. Topic: General - Other >> Jan 12, 2020 11:34 AM Antonieta Iba C wrote: Reason for CRM: current pt Karla [name] discussed with PCP, PCP agreeded to accept pt as a new patient. Pt called in to schedule, pt would like to come in on 5/13 at 10:20 if possible.   Please advise.

## 2020-01-12 NOTE — Telephone Encounter (Signed)
Apt scheduled for 02/04/2020 at 10:20am.  Pt advised.   Thanks,   -Mickel Baas

## 2020-01-12 NOTE — Telephone Encounter (Signed)
Initially assisted pt with scheduling and realized that a CRM needed to be sent first, please advise? If provider will accept pt she would like to be scheduled on the date /time below.    Please advise.

## 2020-01-12 NOTE — Telephone Encounter (Signed)
Patient reports she called and schedule with Sonia Baller for new patient on 02/04/2020 at 10:20am.

## 2020-01-13 MED ORDER — OMEPRAZOLE 20 MG CAPSULE,DELAYED RELEASE
ORAL_CAPSULE | 1 refills | 0 days | Status: CP
Start: 2020-01-13 — End: ?

## 2020-01-25 DIAGNOSIS — H5213 Myopia, bilateral: Secondary | ICD-10-CM | POA: Diagnosis not present

## 2020-02-04 ENCOUNTER — Encounter: Payer: Self-pay | Admitting: Physician Assistant

## 2020-02-04 ENCOUNTER — Other Ambulatory Visit: Payer: Self-pay

## 2020-02-04 ENCOUNTER — Ambulatory Visit (INDEPENDENT_AMBULATORY_CARE_PROVIDER_SITE_OTHER): Payer: Medicare Other | Admitting: Physician Assistant

## 2020-02-04 ENCOUNTER — Ambulatory Visit: Payer: Self-pay | Admitting: Physician Assistant

## 2020-02-04 VITALS — BP 115/75 | HR 58 | Temp 97.5°F | Resp 16 | Ht 62.5 in | Wt 163.8 lb

## 2020-02-04 DIAGNOSIS — R7301 Impaired fasting glucose: Secondary | ICD-10-CM

## 2020-02-04 DIAGNOSIS — M818 Other osteoporosis without current pathological fracture: Secondary | ICD-10-CM

## 2020-02-04 DIAGNOSIS — E042 Nontoxic multinodular goiter: Secondary | ICD-10-CM | POA: Diagnosis not present

## 2020-02-04 DIAGNOSIS — K5100 Ulcerative (chronic) pancolitis without complications: Secondary | ICD-10-CM

## 2020-02-04 DIAGNOSIS — T50905A Adverse effect of unspecified drugs, medicaments and biological substances, initial encounter: Secondary | ICD-10-CM

## 2020-02-04 DIAGNOSIS — M25531 Pain in right wrist: Secondary | ICD-10-CM

## 2020-02-04 DIAGNOSIS — F330 Major depressive disorder, recurrent, mild: Secondary | ICD-10-CM

## 2020-02-04 DIAGNOSIS — W19XXXA Unspecified fall, initial encounter: Secondary | ICD-10-CM

## 2020-02-04 DIAGNOSIS — L932 Other local lupus erythematosus: Secondary | ICD-10-CM

## 2020-02-04 NOTE — Progress Notes (Signed)
New patient visit   Patient: Karla [name]   DOB: 01/11/1956   64 y.o. Female  MRN: [id number] Visit Date: 02/04/2020  Today's healthcare provider: [name], PA-C   Chief Complaint  Patient presents with  . New Patient (Initial Visit)  . Establish Care   Subjective    Karla [name] is a 64 y.o. female who presents today as a new patient to establish care. Previously seen Karla Guarneri, NP at [facility]. Left practice due to not being able to get in contact or get responses from the office. No issues with provider.  HPI  Mammogram and bone density :Last year. [facility] on [employer] Pap: Last year per patient w/[facility] Dr. Gemma Payor  Past Medical History:  Diagnosis Date  . GERD (gastroesophageal reflux disease)   . Insomnia   . Restless leg syndrome   . Ulcerative colitis ([place])   . Vertigo    Past Surgical History:  Procedure Laterality Date  . NECK SURGERY    . SHOULDER SURGERY Left    Family Status  Relation Name Status  . Mother  Alive  . Father  Deceased  . Sister  Alive  . Brother  Alive  . MGM  Deceased  . MGF  Deceased  . PGM  Deceased  . PGF  Deceased   Family History  Problem Relation Age of Onset  . Congestive Heart Failure Mother   . COPD Mother   . Macular degeneration Mother   . Vitiligo Mother   . Arthritis Father   . Diabetes Father    Social History   Socioeconomic History  . Marital status: Single    Spouse name: Not on file  . Number of children: Not on file  . Years of education: Not on file  . Highest education level: Not on file  Occupational History  . Not on file  Tobacco Use  . Smoking status: Never Smoker  . Smokeless tobacco: Never Used  Substance and Sexual Activity  . Alcohol use: Never  . Drug use: Never  . Sexual activity: Not Currently  Other Topics Concern  . Not on file  Social History Narrative  . Not on file   Social Determinants of Health    Financial Resource Strain:   . Difficulty of Paying Living Expenses:   Food Insecurity:   . Worried About Charity fundraiser in the Last Year:   . Arboriculturist in the Last Year:   Transportation Needs:   . Film/video editor (Medical):   Marland Kitchen Lack of Transportation (Non-Medical):   Physical Activity:   . Days of Exercise per Week:   . Minutes of Exercise per Session:   Stress:   . Feeling of Stress :   Social Connections:   . Frequency of Communication with Friends and Family:   . Frequency of Social Gatherings with Friends and Family:   . Attends Religious Services:   . Active Member of Clubs or Organizations:   . Attends Archivist Meetings:   Marland Kitchen Marital Status:    Outpatient Medications Prior to Visit  Medication Sig Note  . citalopram (CELEXA) 40 MG tablet TAKE 1 TABLET BY MOUTH ONCE DAILY   . omeprazole (PRILOSEC) 20 MG capsule Take by mouth.   . Ustekinumab (STELARA [place]) Inject into the skin. Injection every 8 weeks   . [DISCONTINUED] traZODone (DESYREL) 50 MG tablet Take by mouth. 02/04/2020: vertigo  . melatonin  3 MG TABS tablet Take 1 tablet (3 mg total) by mouth at bedtime.   . [DISCONTINUED] celecoxib (CELEBREX) 100 MG capsule TAKE 1 CAPSULE(100 MG) BY MOUTH TWICE DAILY    No facility-administered medications prior to visit.   Allergies  Allergen Reactions  . Mesalamine     Other reaction(s): Other (See Comments), Other (See Comments) pancreatitis Pancreatitis Pancreatitis   . Prednisone Itching and Palpitations    Other reaction(s): Other (See Comments) Hair loss Alopecia   . Amoxicillin Other (See Comments)    Other reaction(s): Unknown unknown unknown unknown   . Metronidazole Nausea And Vomiting    Abdominal pain, dysgeusia Abdominal pain, dysgeusia Abdominal pain, dysgeusia   . Trazodone And Nefazodone Other (See Comments)    Other reaction(s): Other (See Comments) Severe back pack with muscle aches Severe back pack with  muscle aches Severe back pack with muscle aches     Immunization History  Administered Date(s) Administered  . Influenza Split 08/11/2012  . Influenza,inj,Quad PF,6+ Mos 06/06/2018  . PPD Test 05/16/2018  . Pneumococcal Conjugate-13 08/28/2018  . Tdap 05/23/2010    Health Maintenance  Topic Date Due  . Hepatitis C Screening  Never done  . COVID-19 Vaccine (1) Never done  . HIV Screening  Never done  . PAP SMEAR-Modifier  Never done  . PNA vac Low Risk Adult (2 of 2 - PPSV23) 08/29/2019  . INFLUENZA VACCINE  04/24/2020  . TETANUS/TDAP  05/23/2020  . MAMMOGRAM  03/12/2021  . COLONOSCOPY  11/25/2028  . DEXA SCAN  Completed    Patient Care Team: Rubye Beach as PCP - General (Family Medicine)  Review of Systems  Constitutional: Negative.   HENT: Negative.   Eyes: Negative.   Respiratory: Negative.   Cardiovascular: Negative.   Gastrointestinal: Negative.   Endocrine: Negative.   Genitourinary: Negative.   Musculoskeletal: Positive for arthralgias.  Skin: Negative.   Allergic/Immunologic: Negative.   Neurological: Negative.   Hematological: Negative.   Psychiatric/Behavioral: Negative.     Last CBC Lab Results  Component Value Date   WBC 11.4 (H) 05/05/2018   HGB 12.3 05/05/2018   HCT 37.3 05/05/2018   MCV 86.2 05/05/2018   MCH 28.5 05/05/2018   RDW 12.7 05/05/2018   PLT 359 23/30/0762   Last metabolic panel Lab Results  Component Value Date   GLUCOSE 97 05/05/2018   NA 138 05/05/2018   K 3.1 (L) 05/05/2018   CL 101 05/05/2018   CO2 24 05/05/2018   BUN 12 05/05/2018   CREATININE 0.88 05/05/2018   GFRNONAA >60 05/05/2018   GFRAA >60 05/05/2018   CALCIUM 9.3 05/05/2018   PROT 7.7 05/05/2018   ALBUMIN 3.4 (L) 05/05/2018   BILITOT 0.4 05/05/2018   ALKPHOS 74 05/05/2018   AST 13 (L) 05/05/2018   ALT 9 05/05/2018   ANIONGAP 13 05/05/2018      Objective    BP 115/75 (BP Location: Left Arm, Patient Position: Sitting, Cuff Size:  Large)   Pulse (!) 58   Temp (!) 97.5 F (36.4 C) (Temporal)   Resp 16   Ht 5' 2.5" (1.588 m)   Wt 163 lb 12.8 oz (74.3 kg)   BMI 29.48 kg/m  Physical Exam Vitals reviewed.  Constitutional:      General: She is not in acute distress.    Appearance: Normal appearance. She is well-developed. She is not diaphoretic.  HENT:     Head: Normocephalic and atraumatic.     Right Ear: Tympanic membrane,  ear canal and external ear normal.     Left Ear: Tympanic membrane, ear canal and external ear normal.  Eyes:     General: No scleral icterus.       Right eye: No discharge.        Left eye: No discharge.     Extraocular Movements: Extraocular movements intact.     Conjunctiva/sclera: Conjunctivae normal.     Pupils: Pupils are equal, round, and reactive to light.  Neck:     Thyroid: No thyromegaly.     Vascular: No carotid bruit or JVD.     Trachea: No tracheal deviation.  Cardiovascular:     Rate and Rhythm: Normal rate and regular rhythm.     Pulses: Normal pulses.     Heart sounds: Normal heart sounds. No murmur. No friction rub. No gallop.   Pulmonary:     Effort: Pulmonary effort is normal. No respiratory distress.     Breath sounds: Normal breath sounds. No wheezing or rales.  Chest:     Chest wall: No tenderness.  Abdominal:     General: Abdomen is flat. Bowel sounds are normal. There is no distension.     Palpations: Abdomen is soft. There is no mass.     Tenderness: There is no abdominal tenderness. There is no guarding or rebound.  Musculoskeletal:        General: No tenderness. Normal range of motion.     Cervical back: Normal range of motion and neck supple.     Right lower leg: No edema.     Left lower leg: No edema.  Lymphadenopathy:     Cervical: No cervical adenopathy.  Skin:    General: Skin is warm and dry.     Capillary Refill: Capillary refill takes less than 2 seconds.     Findings: No rash.  Neurological:     General: No focal deficit present.      Mental Status: She is alert and oriented to person, place, and time. Mental status is at baseline.  Psychiatric:        Mood and Affect: Mood normal.        Behavior: Behavior normal.        Thought Content: Thought content normal.        Judgment: Judgment normal.      Depression Screen PHQ 2/9 Scores 02/04/2020 05/04/2019  PHQ - 2 Score 0 0  PHQ- 9 Score 0 2   Results for orders placed or performed in visit on 02/04/20  HM MAMMOGRAPHY  Result Value Ref Range   HM Mammogram 0-4 Bi-Rad 0-4 Bi-Rad, Self Reported Normal  HM DEXA SCAN  Result Value Ref Range   HM Dexa Scan osteoporosis   HM COLONOSCOPY  Result Value Ref Range   HM Colonoscopy See Report (in chart) See Report (in chart), Patient Reported    Assessment & Plan      1. 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 Followed by [facility].  2. IFG (impaired fasting glucose) Stable. Diet controlled.  3. Multinodular goiter Stable. Asymptomatic. Noted on C spine MRI from 2011.  4. Other osteoporosis without current pathological fracture Noted from bone density done 2020 with [facility] diagnostic imaging. T score was -2.9 in lumbar spine and -2.7 in left femoral neck. Left total hip was -2.0 and left radius was -1.6.   5. Drug-induced lupus erythematosus Causes arthritic pain. Uses NSAIDS prn.   6. Mild episode of recurrent major depressive disorder (  HCC) Stable on Citalopram 40m.   7. Fall, [place] on out stretched hand. Has had pain in wrist since. Unchanged. No swelling or loss of motion. Will get imaging as below since she has h/o osteopenia/osteoporosis. Will f/u pending results.  - DG Wrist Complete Right; Future  8. Right wrist pain See above medical treatment plan. - DG Wrist Complete Right; Future   No follow-ups on file.     IReynolds Bowl PA-C, have reviewed all documentation for this visit. The documentation on 02/15/20 for  the exam, diagnosis, procedures, and orders are all accurate and complete.   JRubye Beach [phone](phone) [phone](fax)  [place]

## 2020-02-04 NOTE — Patient Instructions (Signed)
Osteostrong for osteoporosis Add calcium + Vit D fortified milk Adding yoga for bone health My hours are M, Th, F 7a-7p Available by [facility] otherwise

## 2020-03-08 ENCOUNTER — Encounter: Payer: Self-pay | Admitting: Physician Assistant

## 2020-03-27 ENCOUNTER — Emergency Department: Payer: Medicare Other

## 2020-03-27 ENCOUNTER — Other Ambulatory Visit: Payer: Self-pay

## 2020-03-27 ENCOUNTER — Emergency Department
Admission: EM | Admit: 2020-03-27 | Discharge: 2020-03-28 | Disposition: A | Discharge status: Home / Self Care | Payer: Medicare Other | Attending: Emergency Medicine | Admitting: Emergency Medicine

## 2020-03-27 DIAGNOSIS — R112 Nausea with vomiting, unspecified: Secondary | ICD-10-CM | POA: Insufficient documentation

## 2020-03-27 DIAGNOSIS — N201 Calculus of ureter: Secondary | ICD-10-CM | POA: Diagnosis not present

## 2020-03-27 DIAGNOSIS — K429 Umbilical hernia without obstruction or gangrene: Secondary | ICD-10-CM | POA: Diagnosis not present

## 2020-03-27 DIAGNOSIS — R109 Unspecified abdominal pain: Secondary | ICD-10-CM | POA: Diagnosis not present

## 2020-03-27 DIAGNOSIS — K449 Diaphragmatic hernia without obstruction or gangrene: Secondary | ICD-10-CM | POA: Diagnosis not present

## 2020-03-27 DIAGNOSIS — M549 Dorsalgia, unspecified: Secondary | ICD-10-CM | POA: Insufficient documentation

## 2020-03-27 DIAGNOSIS — I700 Atherosclerosis of aorta: Secondary | ICD-10-CM | POA: Diagnosis not present

## 2020-03-27 DIAGNOSIS — N132 Hydronephrosis with renal and ureteral calculous obstruction: Secondary | ICD-10-CM | POA: Diagnosis not present

## 2020-03-27 LAB — URINALYSIS, COMPLETE (UACMP) WITH MICROSCOPIC
Bacteria, UA: NONE SEEN
Bilirubin Urine: NEGATIVE
Glucose, UA: NEGATIVE mg/dL
Ketones, ur: 5 mg/dL — AB
Nitrite: NEGATIVE
Protein, ur: NEGATIVE mg/dL
Specific Gravity, Urine: 1.024 (ref 1.005–1.030)
pH: 6 (ref 5.0–8.0)

## 2020-03-27 LAB — BASIC METABOLIC PANEL
Anion gap: 11 (ref 5–15)
BUN: 24 mg/dL — ABNORMAL HIGH (ref 8–23)
CO2: 22 mmol/L (ref 22–32)
Calcium: 8.8 mg/dL — ABNORMAL LOW (ref 8.9–10.3)
Chloride: 106 mmol/L (ref 98–111)
Creatinine, Ser: 1.15 mg/dL — ABNORMAL HIGH (ref 0.44–1.00)
GFR calc Af Amer: 58 mL/min — ABNORMAL LOW (ref >60)
GFR calc non Af Amer: 50 mL/min — ABNORMAL LOW (ref >60)
Glucose, Bld: 107 mg/dL — ABNORMAL HIGH (ref 70–99)
Potassium: 3.5 mmol/L (ref 3.5–5.1)
Sodium: 139 mmol/L (ref 135–145)

## 2020-03-27 LAB — CBC
HCT: 36.3 % (ref 36.0–46.0)
Hemoglobin: 12.1 g/dL (ref 12.0–15.0)
MCH: 26.5 pg (ref 26.0–34.0)
MCHC: 33.3 g/dL (ref 30.0–36.0)
MCV: 79.6 fL — ABNORMAL LOW (ref 80.0–100.0)
Platelets: 311 10*3/uL (ref 150–400)
RBC: 4.56 MIL/uL (ref 3.87–5.11)
RDW: 14.3 % (ref 11.5–15.5)
WBC: 6.9 10*3/uL (ref 4.0–10.5)
nRBC: 0 % (ref 0.0–0.2)

## 2020-03-27 MED ORDER — FENTANYL CITRATE (PF) 100 MCG/2ML IJ SOLN
50.0000 ug | INTRAMUSCULAR | Status: DC | PRN
  Administered 2020-03-27: 50 ug via INTRAVENOUS
  Filled 2020-03-27: qty 2

## 2020-03-27 MED ORDER — HYDROMORPHONE HCL 1 MG/ML IJ SOLN
1.0000 mg | Freq: Once | INTRAMUSCULAR | Status: AC
  Administered 2020-03-27: 1 mg via INTRAVENOUS
  Filled 2020-03-27: qty 1

## 2020-03-27 MED ORDER — OXYCODONE-ACETAMINOPHEN 5-325 MG PO TABS
1.0000 | ORAL_TABLET | ORAL | Status: DC | PRN
  Administered 2020-03-27: 1 via ORAL
  Filled 2020-03-27: qty 1

## 2020-03-27 MED ORDER — KETOROLAC TROMETHAMINE 30 MG/ML IJ SOLN
30.0000 mg | Freq: Once | INTRAMUSCULAR | Status: AC
  Administered 2020-03-27: 30 mg via INTRAVENOUS
  Filled 2020-03-27: qty 1

## 2020-03-27 MED ORDER — ONDANSETRON 4 MG PO TBDP
4.0000 mg | ORAL_TABLET | Freq: Once | ORAL | Status: AC
  Administered 2020-03-27: 4 mg via ORAL
  Filled 2020-03-27: qty 1

## 2020-03-27 NOTE — Discharge Instructions (Addendum)
You have been seen in the Emergency Department (ED) today for pain caused by kidney stones.  As we have discussed, please drink plenty of fluids.  Please make a follow up appointment with the physician(s) listed elsewhere in this documentation.  You may take pain medication as needed but ONLY as prescribed.  Please also take your prescribed Flomax daily.  We also recommend that you take over-the-counter ibuprofen regularly according to label instructions over the next 5 days.  Take it with meals to minimize stomach discomfort.  However, when you talk to the urologist, ask them if you should continue taking the ibuprofen.  Sometimes they will want to stop taking it on Tuesday to prepare for a possible procedure on Thursday.  Please see your doctor as soon as possible as stones may take 1-3 weeks tDo not drink alcohol, drive or participate in any other potentially dangerous activities while taking opiate pain medication as it may make you sleepy. Do not take this medication with any other sedating medications, either prescription or over-the-counter. If you were prescribed Percocet or Vicodin, do not take these with acetaminophen (Tylenol) as it is already contained within these medications.   Take Percocet as needed for severe pain.  This medication is an opiate (or narcotic) pain medication and can be habit forming.  Use it as little as possible to achieve adequate pain control.  Do not use or use it with extreme caution if you have a history of opiate abuse or dependence.  If you are on a pain contract with your primary care doctor or a pain specialist, be sure to let them know you were prescribed this medication today from the [facility] Emergency Department.  This medication is intended for your use only - do not give any to anyone else and keep it in a secure place where nobody else, especially children, have access to it.  It will also cause or worsen constipation, so you may want to consider  taking an over-the-counter stool softener while you are taking this medication.  Return to the Emergency Department (ED) or call your doctor if you have any worsening pain, fever, painful urination, are unable to urinate, or develop other symptoms that concern you.  In addition to the kidney stone, you have a small cyst-type structure on your left ovary. This will need further work-up as an outpatient with an ultrasound. You should discuss this with your primary care doctor and/your gynecologist.

## 2020-03-27 NOTE — ED Triage Notes (Signed)
Pt comes POV with left sided flank and back pain since Thursday. Pt states pain has gotten worse and pt has vomited x2. Pt states urge to urinate has increased.

## 2020-03-27 NOTE — ED Notes (Signed)
This RN notified that patient c/o worsening flank pain at this time, this RN notified by screener and security.

## 2020-03-27 NOTE — ED Notes (Signed)
Pt moaning in pain in waiting room. Explained wait process and pt agreeable. IV placed and meds given per protocol.

## 2020-03-27 NOTE — ED Notes (Signed)
Pt laying in bed complaining of left flank pain. Pt states nausea with some vomiting earlier. Pt states pain started last Thursday, and has been intermittent.

## 2020-03-27 NOTE — ED Provider Notes (Signed)
[facility] Emergency Department Provider Note ____________________________________________   First MD Initiated Contact with Patient 03/27/20 2117     (approximate)  I have reviewed the triage vital signs and the nursing notes.   HISTORY  Chief Complaint Flank Pain    HPI Karla [name] is a 64 y.o. female with PMH as noted below who presents with left flank pain over the last few days, with an initial episode 3 days ago that resolved and then came back today.  She states it is not radiating.  It is associated with nausea and vomiting but no diarrhea or urinary symptoms.  She denies any prior history of similar pain.  She has no fever or chills.  Past Medical History:  Diagnosis Date  . GERD (gastroesophageal reflux disease)   . Insomnia   . Restless leg syndrome   . Ulcerative colitis ([place])   . Vertigo     Patient Active Problem List   Diagnosis Date Noted  . Hiatal hernia 05/04/2019  . 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 Depression 05/14/2018  . 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 History of Clostridium difficile infection 06/03/2012  . IBS (irritable bowel syndrome) 06/03/2012  . IFG (impaired fasting glucose) 06/03/2012  . Memory loss 06/03/2012  . Multinodular goiter 06/03/2012  . Obstructive sleep apnea 06/03/2012  . Stress incontinence in female 06/03/2012  . Other osteoporosis without current pathological fracture 06/03/2012    Past Surgical History:  Procedure Laterality Date  . NECK SURGERY    . SHOULDER SURGERY Left     Prior to Admission medications   Medication Sig Start Date End Date Taking? Authorizing Provider  citalopram (CELEXA) 40 MG tablet TAKE 1 TABLET BY MOUTH ONCE DAILY 08/01/16   [provider]  melatonin 3 MG TABS tablet Take 1 tablet (3 mg total) by mouth at bedtime. 02/04/20   [name], PA-C  omeprazole (PRILOSEC) 20 MG capsule Take by mouth. 11/29/17 02/04/20   [provider]  Ustekinumab (STELARA [place]) Inject into the skin. Injection every 8 weeks    [provider]    Allergies Mesalamine, Prednisone, Amoxicillin, Metronidazole, and Trazodone and nefazodone  Family History  Problem Relation Age of Onset  . Congestive Heart Failure Mother   . COPD Mother   . Macular degeneration Mother   . Vitiligo Mother   . Arthritis Father   . Diabetes Father     Social History Social History   Tobacco Use  . Smoking status: Never Smoker  . Smokeless tobacco: Never Used  Vaping Use  . Vaping Use: Never used  Substance Use Topics  . Alcohol use: Never  . Drug use: Never    Review of Systems  Constitutional: No fever/chills. Eyes: No redness. ENT: No sore throat. Cardiovascular: Denies chest pain. Respiratory: Denies shortness of breath. Gastrointestinal: Positive for nausea and vomiting. Genitourinary: Negative for dysuria or hematuria.  Musculoskeletal: Positive for back pain. Skin: Negative for rash. Neurological: Negative for headache.   ____________________________________________   PHYSICAL EXAM:  VITAL SIGNS: ED Triage Vitals [03/27/20 1826]  Enc Vitals Group     BP (!) 152/79     Pulse Rate (!) 58     Resp 18     Temp 98.1 F (36.7 C)     Temp Source Oral     SpO2 100 %     Weight 160 lb (72.6 kg)     Height 5' 3"  (1.6 m)     Head  Circumference      Peak Flow      Pain Score 10     Pain Loc      Pain Edu?      Excl. in [place]?     Constitutional: Alert and oriented.  Uncomfortable appearing but in no acute distress. Eyes: Conjunctivae are normal.  No scleral icterus. Head: Atraumatic. Nose: No congestion/rhinnorhea. Mouth/Throat: Mucous membranes are moist.   Neck: Normal range of motion.  Cardiovascular: Normal rate, regular rhythm.  Good peripheral circulation. Respiratory: Normal respiratory effort.  No retractions.  Gastrointestinal: Soft and nontender. No distention.  Genitourinary: No  CVA tenderness.  Mild left flank tenderness. Musculoskeletal: Extremities warm and well perfused.  Neurologic:  Normal speech and language. No gross focal neurologic deficits are appreciated.  Skin:  Skin is warm and dry. No rash noted. Psychiatric: Mood and affect are normal. Speech and behavior are normal.  ____________________________________________   LABS (all labs ordered are listed, but only abnormal results are displayed)  Labs Reviewed  URINALYSIS, COMPLETE (UACMP) WITH MICROSCOPIC - Abnormal; Notable for the following components:      Result Value   Color, Urine YELLOW (*)    APPearance CLEAR (*)    Hgb urine dipstick SMALL (*)    Ketones, ur 5 (*)    Leukocytes,Ua TRACE (*)    All other components within normal limits  BASIC METABOLIC PANEL - Abnormal; Notable for the following components:   Glucose, Bld 107 (*)    BUN 24 (*)    Creatinine, Ser 1.15 (*)    Calcium 8.8 (*)    GFR calc non Af Amer 50 (*)    GFR calc Af Amer 58 (*)    All other components within normal limits  CBC - Abnormal; Notable for the following components:   MCV 79.6 (*)    All other components within normal limits   ____________________________________________  EKG   ____________________________________________  RADIOLOGY  CT abdomen: 7 mm distal left ureteral stone with mild hydronephrosis.  2 cm left ovarian cystic structure  ____________________________________________   PROCEDURES  Procedure(s) performed: No  Procedures  Critical Care performed: No ____________________________________________   INITIAL IMPRESSION / ASSESSMENT AND PLAN / ED COURSE  Pertinent labs & imaging results that were available during my care of the patient were reviewed by me and considered in my medical decision making (see chart for details).  64 year old female with PMH as noted above presents with acute onset of left flank pain initially a few days ago which resolved and then returned today.   It is associated with nausea and vomiting.  On exam, the patient is very uncomfortable appearing but in no acute distress.  Her vital signs are normal except for mild hypertension.  The abdomen is soft and nontender although there is mild tenderness in the left flank.  The remainder of the exam is as described above.  Initial lab work-up obtained from triage is unremarkable except for minimally elevated creatinine.  The patient's urinalysis shows 11-20 RBCs and WBCs with no bacteria.  Overall presentation is most consistent with ureteral stone, but differential also includes pyelonephritis.  I have a low suspicion for colitis, diverticulitis, or other intra-abdominal cause.  I also have low suspicion for vascular etiology.  We will obtain a CT noncontrast to evaluate for ureteral stone.  If this is negative for stone, I will consider other etiologies.  ----------------------------------------- 10:59 PM on 03/27/2020 -----------------------------------------  The patient's pain is well controlled after 2 mg of  Dilaudid and 30 mg of Toradol. CT shows a 7 mm stone in the left distal ureter with mild hydronephrosis, which is consistent with the patient's pain.  We will monitor the patient for an additional 1 to 2 hours. If she has significant recurrence of her pain she may need admission. Otherwise, she likely will be able to go home. I can sign her out to the oncoming physician Dr. Karma Greaser.  ____________________________________________   FINAL CLINICAL IMPRESSION(S) / ED DIAGNOSES  Final diagnoses:  None      NEW MEDICATIONS STARTED DURING THIS VISIT:  New Prescriptions   No medications on file     Note:  This document was prepared using Dragon voice recognition software and may include unintentional dictation errors.    Arta Silence, MD 03/27/20 2300

## 2020-03-27 NOTE — ED Provider Notes (Signed)
-----------------------------------------   11:04 PM on 03/27/2020 -----------------------------------------  Assuming care from Dr. Cherylann Banas.  In short, Karla [name] is a 64 y.o. female with a chief complaint of ureteral colic.  Refer to the original H&P for additional details.  The current plan of care is to reassess after pain control to determine if patient's symptoms will be able to be managed at home.    ----------------------------------------- 1:04 AM on 03/28/2020 -----------------------------------------  Patient's pain is well controlled.  I reassessed her and she is reporting her pain is a 0.  She is having some itching particularly of her nose that I suspect is reaction to the narcotics so I gave her Benadryl 25 mg by mouth.  She has some concerns about if the pain comes back but I had my usual and customary ureteral/renal colic management discussion with her and emphasized the importance of close outpatient follow-up with urology.  She understands and agrees with the plan.  I gave my usual and customary return precautions.   Hinda Kehr, MD 03/28/20 [phone]

## 2020-03-28 MED ORDER — ONDANSETRON 4 MG PO TBDP
4.0000 mg | ORAL_TABLET | Freq: Once | ORAL | Status: AC
  Administered 2020-03-28: 4 mg via ORAL

## 2020-03-28 MED ORDER — DIPHENHYDRAMINE HCL 25 MG PO CAPS
25.0000 mg | ORAL_CAPSULE | Freq: Once | ORAL | Status: AC
  Administered 2020-03-28: 25 mg via ORAL
  Filled 2020-03-28: qty 1

## 2020-03-28 MED ORDER — HYDROCODONE-ACETAMINOPHEN 5-325 MG PO TABS: 1.0000 | ORAL_TABLET | Freq: Four times a day (QID) | ORAL | 0 refills | Status: DC | PRN

## 2020-03-28 MED ORDER — DOCUSATE SODIUM 100 MG PO CAPS: ORAL_CAPSULE | ORAL | 0 refills | Status: DC

## 2020-03-28 MED ORDER — TAMSULOSIN HCL 0.4 MG PO CAPS
ORAL_CAPSULE | ORAL | 0 refills | Status: DC
Start: 2020-03-28 — End: 2021-03-23

## 2020-03-28 MED ORDER — ONDANSETRON 4 MG PO TBDP
ORAL_TABLET | ORAL | Status: AC
  Filled 2020-03-28: qty 1

## 2020-03-28 MED ORDER — ONDANSETRON HCL 8 MG PO TABS: 8.0000 mg | ORAL_TABLET | Freq: Three times a day (TID) | ORAL | 0 refills | Status: DC | PRN

## 2020-03-28 NOTE — ED Notes (Signed)
Pt in wheelchair and states she is feeling better. Pt states she is feeling good to go home right now.

## 2020-03-28 NOTE — ED Notes (Signed)
Pt states she feels nauseated and light headed.

## 2020-03-28 NOTE — ED Notes (Signed)
RN got pt into car, of friend that came and picked her up to take her home

## 2020-03-28 NOTE — ED Notes (Signed)
ER provider notified of pts nausea and lightheaded ness. RN got pt back into bed. Provider gave verbal order for zofran and that pt can be given water and can still be discharged. Pt laying in bed with water and cool rag on head. Pt states she is feeling a little better. Ride on way to come pick up pt.

## 2020-03-28 NOTE — ED Notes (Signed)
Pt states she is feeling better .

## 2020-03-29 NOTE — Progress Notes (Signed)
03/30/2020 2:16 PM   Karla [name] 1955/09/30 [id number]  Referring provider: [name], PA-C [place],  [place] [address] Chief Complaint  Patient presents with  . Nephrolithiasis    HPI: Karla [name] is a 64 y.o. female with left flank pain and a 7 mm left distal ureter stone.   The patient was presented to the ED on 03/27/2020 for left flank pain. Pain lasted a few days, with an initial episode 3 days ago that resolved and then came back.  It was associated with nausea and vomiting but no diarrhea or urinary symptoms. There was n radiating pain.   Urinalysis showed 11-20 RBCs and WBCs with no bacteria. CT renal stone showed a 7 mm stone in the left distal ureter with mild hydronephrosis, which is consistent with the patient's pain.  The patient's pain was well controlled after 2 mg of Dilaudid and 30 mg of Toradol.   She had initial pain 1 week prior to ED visit but pain resolved. Her pain came back a few days later and was persistent. Pain radiates to her back.  Denies fevers and chills.   She has some urgency and frequency which are new onset symptoms. She has no dysuria.    PMH: Past Medical History:  Diagnosis Date  . GERD (gastroesophageal reflux disease)   . Insomnia   . Restless leg syndrome   . Ulcerative colitis ([place])   . Vertigo     Surgical History: Past Surgical History:  Procedure Laterality Date  . NECK SURGERY    . SHOULDER SURGERY Left     Home Medications:  Allergies as of 03/30/2020      Reactions   Mesalamine    Other reaction(s): Other (See Comments), Other (See Comments) pancreatitis Pancreatitis Pancreatitis   Prednisone Itching, Palpitations   Other reaction(s): Other (See Comments) Hair loss Alopecia   Amoxicillin Other (See Comments)   Other reaction(s): Unknown unknown unknown unknown   Metronidazole Nausea And Vomiting   Abdominal pain, dysgeusia Abdominal pain, dysgeusia Abdominal  pain, dysgeusia   Trazodone And Nefazodone Other (See Comments)   Other reaction(s): Other (See Comments) Severe back pack with muscle aches Severe back pack with muscle aches Severe back pack with muscle aches      Medication List       Accurate as of March 30, 2020  2:16 PM. If you have any questions, ask your nurse or doctor.        acetaminophen 325 MG tablet Commonly known as: TYLENOL Take by mouth.   celecoxib 100 MG capsule Commonly known as: CELEBREX Take by mouth.   citalopram 40 MG tablet Commonly known as: CELEXA TAKE 1 TABLET BY MOUTH ONCE DAILY   docusate sodium 100 MG capsule Commonly known as: Colace Take 1 tablet once or twice daily as needed for constipation while taking narcotic pain medicine   HYDROcodone-acetaminophen 5-325 MG tablet Commonly known as: NORCO/VICODIN Take 1-2 tablets by mouth every 6 (six) hours as needed for moderate pain or severe pain.   melatonin 3 MG Tabs tablet Take 1 tablet (3 mg total) by mouth at bedtime.   omeprazole 20 MG capsule Commonly known as: PRILOSEC Take by mouth.   ondansetron 8 MG tablet Commonly known as: ZOFRAN Take 1 tablet (8 mg total) by mouth every 8 (eight) hours as needed for nausea or vomiting.   STELARA [place] Inject into the skin. Injection every 8 weeks   Stelara 45 MG/0.5ML Soln Generic drug:  Ustekinumab   tamsulosin 0.4 MG Caps capsule Commonly known as: FLOMAX Take 1 tablet by mouth daily until you pass the kidney stone or no longer have symptoms   traZODone 50 MG tablet Commonly known as: DESYREL Take by mouth.       Allergies:  Allergies  Allergen Reactions  . Mesalamine     Other reaction(s): Other (See Comments), Other (See Comments) pancreatitis Pancreatitis Pancreatitis   . Prednisone Itching and Palpitations    Other reaction(s): Other (See Comments) Hair loss Alopecia   . Amoxicillin Other (See Comments)    Other reaction(s): Unknown unknown unknown unknown   .  Metronidazole Nausea And Vomiting    Abdominal pain, dysgeusia Abdominal pain, dysgeusia Abdominal pain, dysgeusia   . Trazodone And Nefazodone Other (See Comments)    Other reaction(s): Other (See Comments) Severe back pack with muscle aches Severe back pack with muscle aches Severe back pack with muscle aches     Family History: Family History  Problem Relation Age of Onset  . Congestive Heart Failure Mother   . COPD Mother   . Macular degeneration Mother   . Vitiligo Mother   . Arthritis Father   . Diabetes Father     Social History:  reports that she has never smoked. She has never used smokeless tobacco. She reports that she does not drink alcohol and does not use drugs.   Physical Exam: BP 115/72 (BP Location: Left Arm, Patient Position: Sitting, Cuff Size: Normal)   Pulse 60   Ht 5' 3"  (1.6 m)   Wt 163 lb 9.6 oz (74.2 kg)   BMI 28.98 kg/m   Constitutional:  Alert and oriented, No acute distress.  Accompanied by daughter today. HEENT: [place] AT, moist mucus membranes.  Trachea midline, no masses. Cardiovascular: No clubbing, cyanosis, or edema. Respiratory: Normal respiratory effort, no increased work of breathing. Skin: No rashes, bruises or suspicious lesions. Neurologic: Grossly intact, no focal deficits, moving all 4 extremities. Psychiatric: Normal mood and affect.  Laboratory Data:  Lab Results  Component Value Date   CREATININE 1.15 (H) 03/27/2020    Urinalysis Ua today is bland   Pertinent Imaging:  Results for orders placed during the hospital encounter of 03/27/20  CT Renal Stone Study  Narrative CLINICAL DATA:  64 year female with left flank pain. Concern for kidney stone.  EXAM: CT ABDOMEN AND PELVIS WITHOUT CONTRAST  TECHNIQUE: Multidetector CT imaging of the abdomen and pelvis was performed following the standard protocol without IV contrast.  COMPARISON:  CT abdomen pelvis dated 02/25/2008 and abdominal ultrasound dated  05/05/2018.  FINDINGS: Evaluation of this exam is limited in the absence of intravenous contrast.  Lower chest: The visualized lung bases are clear.  No intra-abdominal free air or free fluid.  Hepatobiliary: No focal liver abnormality is seen. No gallstones, gallbladder wall thickening, or biliary dilatation.  Pancreas: Unremarkable. No pancreatic ductal dilatation or surrounding inflammatory changes.  Spleen: Normal in size without focal abnormality.  Adrenals/Urinary Tract: The adrenal glands are unremarkable. There is a 7 mm stone in the distal left ureter close to the ureterovesical junction. There is mild left hydro nephro ureter. There is no hydronephrosis or nephrolithiasis the right. The right ureter and urinary bladder appear unremarkable.  Stomach/Bowel: There is a moderate size hiatal hernia. There is moderate stool throughout the colon. No bowel obstruction or active inflammation. Normal appendix.  Vascular/Lymphatic: Mild aortoiliac atherosclerotic disease. The IVC is unremarkable. No portal venous gas. There is no adenopathy.  Reproductive: The uterus is retroverted or retroflexed. There is a 2 cm left ovarian cystic structure which is not characterized on this CT. Further evaluation with ultrasound on a nonemergent/outpatient basis recommended. Probable small calcified uterine fibroids.  Other: Small fat containing umbilical hernia.  Musculoskeletal: Osteopenia. Lower lumbar facet arthropathy. Grade 1 L4-L5 anterolisthesis. No acute osseous pathology.  IMPRESSION: 1. A 7 mm distal left ureteral stone with mild left hydronephrosis. 2. Moderate size hiatal hernia. 3. A 2 cm left ovarian cystic structure. Further evaluation with ultrasound on a nonemergent/outpatient basis recommended. 4. Aortic Atherosclerosis (ICD10-I70.0).   Electronically Signed By: Anner Crete M.D. On: 03/27/2020 22:22   I have personally reviewed the images and agree with  radiologist interpretation.    Assessment & Plan:    1. Ureteral stone CT renal stone showed a 6- 7 mm stone in the left distal ureter with mild hydronephrosis, which is consistent with the patient's pain.  Based on size and location of the stone, she has ~70% of spontaneous interval stone passage over 30-day period.  Given that the stone seems to be moving in location, she may have a higher chance than the aforementioned.  Medical expulsive therapy was discussed as an option.  Recommended starting Flomax.  We discussed various treatment options including ESWL vs. ureteroscopy, laser lithotripsy, and stent. We discussed the risks and benefits of both including bleeding, infection, damage to surrounding structures, efficacy with need for possible further intervention, and need for temporary ureteral stent.   Patient agreed to shock wave. She understands that if the stone cannot be visualized at the time of shockwave, we will not be able to treat it in that manner.  She understands willing to accept that risk.  2. Renal Cyst Patient concerned about cyst finding on renal CT. Recommended follow up with an [place] today   [facility] [address] [place], [place] [address] [phone]  I, Selena Batten, am acting as a scribe for Dr. Hollice Espy.  I have reviewed the above documentation for accuracy and completeness, and I agree with the above.   Hollice Espy, MD

## 2020-03-29 NOTE — H&P (View-Only) (Signed)
03/30/2020 2:16 PM   Karla [name] 08/11/1956 [id number]  Referring provider: [name], PA-C [place],  [place] [address] Chief Complaint  Patient presents with   Nephrolithiasis    HPI: Karla [name] is a 64 y.o. female with left flank pain and a 7 mm left distal ureter stone.   The patient was presented to the ED on 03/27/2020 for left flank pain. Pain lasted a few days, with an initial episode 3 days ago that resolved and then came back.  It was associated with nausea and vomiting but no diarrhea or urinary symptoms. There was n radiating pain.   Urinalysis showed 11-20 RBCs and WBCs with no bacteria. CT renal stone showed a 7 mm stone in the left distal ureter with mild hydronephrosis, which is consistent with the patient's pain.  The patient's pain was well controlled after 2 mg of Dilaudid and 30 mg of Toradol.   She had initial pain 1 week prior to ED visit but pain resolved. Her pain came back a few days later and was persistent. Pain radiates to her back.  Denies fevers and chills.   She has some urgency and frequency which are new onset symptoms. She has no dysuria.    PMH: Past Medical History:  Diagnosis Date   GERD (gastroesophageal reflux disease)    Insomnia    Restless leg syndrome    Ulcerative colitis ([place])    Vertigo     Surgical History: Past Surgical History:  Procedure Laterality Date   NECK SURGERY     SHOULDER SURGERY Left     Home Medications:  Allergies as of 03/30/2020      Reactions   Mesalamine    Other reaction(s): Other (See Comments), Other (See Comments) pancreatitis Pancreatitis Pancreatitis   Prednisone Itching, Palpitations   Other reaction(s): Other (See Comments) Hair loss Alopecia   Amoxicillin Other (See Comments)   Other reaction(s): Unknown unknown unknown unknown   Metronidazole Nausea And Vomiting   Abdominal pain, dysgeusia Abdominal pain, dysgeusia Abdominal  pain, dysgeusia   Trazodone And Nefazodone Other (See Comments)   Other reaction(s): Other (See Comments) Severe back pack with muscle aches Severe back pack with muscle aches Severe back pack with muscle aches      Medication List       Accurate as of March 30, 2020  2:16 PM. If you have any questions, ask your nurse or doctor.        acetaminophen 325 MG tablet Commonly known as: TYLENOL Take by mouth.   celecoxib 100 MG capsule Commonly known as: CELEBREX Take by mouth.   citalopram 40 MG tablet Commonly known as: CELEXA TAKE 1 TABLET BY MOUTH ONCE DAILY   docusate sodium 100 MG capsule Commonly known as: Colace Take 1 tablet once or twice daily as needed for constipation while taking narcotic pain medicine   HYDROcodone-acetaminophen 5-325 MG tablet Commonly known as: NORCO/VICODIN Take 1-2 tablets by mouth every 6 (six) hours as needed for moderate pain or severe pain.   melatonin 3 MG Tabs tablet Take 1 tablet (3 mg total) by mouth at bedtime.   omeprazole 20 MG capsule Commonly known as: PRILOSEC Take by mouth.   ondansetron 8 MG tablet Commonly known as: ZOFRAN Take 1 tablet (8 mg total) by mouth every 8 (eight) hours as needed for nausea or vomiting.   STELARA [place] Inject into the skin. Injection every 8 weeks   Stelara 45 MG/0.5ML Soln Generic drug:  Ustekinumab   tamsulosin 0.4 MG Caps capsule Commonly known as: FLOMAX Take 1 tablet by mouth daily until you pass the kidney stone or no longer have symptoms   traZODone 50 MG tablet Commonly known as: DESYREL Take by mouth.       Allergies:  Allergies  Allergen Reactions   Mesalamine     Other reaction(s): Other (See Comments), Other (See Comments) pancreatitis Pancreatitis Pancreatitis    Prednisone Itching and Palpitations    Other reaction(s): Other (See Comments) Hair loss Alopecia    Amoxicillin Other (See Comments)    Other reaction(s): Unknown unknown unknown unknown     Metronidazole Nausea And Vomiting    Abdominal pain, dysgeusia Abdominal pain, dysgeusia Abdominal pain, dysgeusia    Trazodone And Nefazodone Other (See Comments)    Other reaction(s): Other (See Comments) Severe back pack with muscle aches Severe back pack with muscle aches Severe back pack with muscle aches     Family History: Family History  Problem Relation Age of Onset   Congestive Heart Failure Mother    COPD Mother    Macular degeneration Mother    Vitiligo Mother    Arthritis Father    Diabetes Father     Social History:  reports that she has never smoked. She has never used smokeless tobacco. She reports that she does not drink alcohol and does not use drugs.   Physical Exam: BP 115/72 (BP Location: Left Arm, Patient Position: Sitting, Cuff Size: Normal)    Pulse 60    Ht 5' 3"  (1.6 m)    Wt 163 lb 9.6 oz (74.2 kg)    BMI 28.98 kg/m   Constitutional:  Alert and oriented, No acute distress.  Accompanied by daughter today. HEENT: [place] AT, moist mucus membranes.  Trachea midline, no masses. Cardiovascular: No clubbing, cyanosis, or edema. Respiratory: Normal respiratory effort, no increased work of breathing. Skin: No rashes, bruises or suspicious lesions. Neurologic: Grossly intact, no focal deficits, moving all 4 extremities. Psychiatric: Normal mood and affect.  Laboratory Data:  Lab Results  Component Value Date   CREATININE 1.15 (H) 03/27/2020    Urinalysis Ua today is bland   Pertinent Imaging:  Results for orders placed during the hospital encounter of 03/27/20  CT Renal Stone Study  Narrative CLINICAL DATA:  64 year female with left flank pain. Concern for kidney stone.  EXAM: CT ABDOMEN AND PELVIS WITHOUT CONTRAST  TECHNIQUE: Multidetector CT imaging of the abdomen and pelvis was performed following the standard protocol without IV contrast.  COMPARISON:  CT abdomen pelvis dated 02/25/2008 and abdominal ultrasound dated  05/05/2018.  FINDINGS: Evaluation of this exam is limited in the absence of intravenous contrast.  Lower chest: The visualized lung bases are clear.  No intra-abdominal free air or free fluid.  Hepatobiliary: No focal liver abnormality is seen. No gallstones, gallbladder wall thickening, or biliary dilatation.  Pancreas: Unremarkable. No pancreatic ductal dilatation or surrounding inflammatory changes.  Spleen: Normal in size without focal abnormality.  Adrenals/Urinary Tract: The adrenal glands are unremarkable. There is a 7 mm stone in the distal left ureter close to the ureterovesical junction. There is mild left hydro nephro ureter. There is no hydronephrosis or nephrolithiasis the right. The right ureter and urinary bladder appear unremarkable.  Stomach/Bowel: There is a moderate size hiatal hernia. There is moderate stool throughout the colon. No bowel obstruction or active inflammation. Normal appendix.  Vascular/Lymphatic: Mild aortoiliac atherosclerotic disease. The IVC is unremarkable. No portal venous gas. There  is no adenopathy.  Reproductive: The uterus is retroverted or retroflexed. There is a 2 cm left ovarian cystic structure which is not characterized on this CT. Further evaluation with ultrasound on a nonemergent/outpatient basis recommended. Probable small calcified uterine fibroids.  Other: Small fat containing umbilical hernia.  Musculoskeletal: Osteopenia. Lower lumbar facet arthropathy. Grade 1 L4-L5 anterolisthesis. No acute osseous pathology.  IMPRESSION: 1. A 7 mm distal left ureteral stone with mild left hydronephrosis. 2. Moderate size hiatal hernia. 3. A 2 cm left ovarian cystic structure. Further evaluation with ultrasound on a nonemergent/outpatient basis recommended. 4. Aortic Atherosclerosis (ICD10-I70.0).   Electronically Signed By: Anner Crete M.D. On: 03/27/2020 22:22   I have personally reviewed the images and agree with  radiologist interpretation.    Assessment & Plan:    1. Ureteral stone CT renal stone showed a 6- 7 mm stone in the left distal ureter with mild hydronephrosis, which is consistent with the patient's pain.  Based on size and location of the stone, she has ~70% of spontaneous interval stone passage over 30-day period.  Given that the stone seems to be moving in location, she may have a higher chance than the aforementioned.  Medical expulsive therapy was discussed as an option.  Recommended starting Flomax.  We discussed various treatment options including ESWL vs. ureteroscopy, laser lithotripsy, and stent. We discussed the risks and benefits of both including bleeding, infection, damage to surrounding structures, efficacy with need for possible further intervention, and need for temporary ureteral stent.   Patient agreed to shock wave. She understands that if the stone cannot be visualized at the time of shockwave, we will not be able to treat it in that manner.  She understands willing to accept that risk.  2. Renal Cyst Patient concerned about cyst finding on renal CT. Recommended follow up with an [place] today   [facility] [address], [place], [place] [address] [phone]  I, Selena Batten, am acting as a scribe for Dr. Hollice Espy.  I have reviewed the above documentation for accuracy and completeness, and I agree with the above.   Hollice Espy, MD

## 2020-03-30 ENCOUNTER — Other Ambulatory Visit: Payer: Self-pay

## 2020-03-30 ENCOUNTER — Other Ambulatory Visit
Admission: RE | Admit: 2020-03-30 | Discharge: 2020-03-30 | Disposition: A | Discharge status: Home / Self Care | Payer: Medicare Other | Source: Ambulatory Visit | Attending: Urology | Admitting: Urology

## 2020-03-30 ENCOUNTER — Encounter: Payer: Self-pay | Admitting: Urology

## 2020-03-30 ENCOUNTER — Other Ambulatory Visit: Payer: Self-pay | Admitting: Urology

## 2020-03-30 ENCOUNTER — Ambulatory Visit: Payer: Medicare Other | Admitting: Urology

## 2020-03-30 ENCOUNTER — Ambulatory Visit
Admission: RE | Admit: 2020-03-30 | Discharge: 2020-03-30 | Disposition: A | Discharge status: Home / Self Care | Payer: Medicare Other | Source: Ambulatory Visit | Attending: Urology | Admitting: Urology

## 2020-03-30 ENCOUNTER — Ambulatory Visit
Admission: RE | Admit: 2020-03-30 | Discharge: 2020-03-30 | Disposition: A | Discharge status: Home / Self Care | Payer: Medicare Other | Attending: Urology | Admitting: Urology

## 2020-03-30 VITALS — BP 115/72 | HR 60 | Ht 63.0 in | Wt 163.6 lb

## 2020-03-30 DIAGNOSIS — N200 Calculus of kidney: Secondary | ICD-10-CM

## 2020-03-30 DIAGNOSIS — Z20822 Contact with and (suspected) exposure to covid-19: Secondary | ICD-10-CM | POA: Diagnosis not present

## 2020-03-30 LAB — SARS CORONAVIRUS 2 (TAT 6-24 HRS): SARS Coronavirus 2: NEGATIVE

## 2020-03-31 ENCOUNTER — Ambulatory Visit: Payer: Medicare Other

## 2020-03-31 ENCOUNTER — Ambulatory Visit
Admission: RE | Admit: 2020-03-31 | Discharge: 2020-03-31 | Disposition: A | Discharge status: Home / Self Care | Payer: Medicare Other | Attending: Urology | Admitting: Urology

## 2020-03-31 ENCOUNTER — Encounter: Payer: Self-pay | Admitting: Urology

## 2020-03-31 ENCOUNTER — Encounter
Admission: RE | Disposition: A | Discharge status: Home / Self Care | Payer: Self-pay | Source: Home / Self Care | Attending: Urology

## 2020-03-31 DIAGNOSIS — N201 Calculus of ureter: Secondary | ICD-10-CM | POA: Diagnosis not present

## 2020-03-31 DIAGNOSIS — Z820 Family history of epilepsy and other diseases of the nervous system: Secondary | ICD-10-CM | POA: Insufficient documentation

## 2020-03-31 DIAGNOSIS — N132 Hydronephrosis with renal and ureteral calculous obstruction: Secondary | ICD-10-CM | POA: Insufficient documentation

## 2020-03-31 DIAGNOSIS — Z888 Allergy status to other drugs, medicaments and biological substances status: Secondary | ICD-10-CM | POA: Diagnosis not present

## 2020-03-31 DIAGNOSIS — Z8249 Family history of ischemic heart disease and other diseases of the circulatory system: Secondary | ICD-10-CM | POA: Diagnosis not present

## 2020-03-31 DIAGNOSIS — K5190 Ulcerative colitis, unspecified, without complications: Secondary | ICD-10-CM | POA: Insufficient documentation

## 2020-03-31 DIAGNOSIS — K219 Gastro-esophageal reflux disease without esophagitis: Secondary | ICD-10-CM | POA: Diagnosis not present

## 2020-03-31 DIAGNOSIS — Z880 Allergy status to penicillin: Secondary | ICD-10-CM | POA: Diagnosis not present

## 2020-03-31 DIAGNOSIS — Z825 Family history of asthma and other chronic lower respiratory diseases: Secondary | ICD-10-CM | POA: Diagnosis not present

## 2020-03-31 DIAGNOSIS — G4700 Insomnia, unspecified: Secondary | ICD-10-CM | POA: Insufficient documentation

## 2020-03-31 DIAGNOSIS — Z833 Family history of diabetes mellitus: Secondary | ICD-10-CM | POA: Insufficient documentation

## 2020-03-31 DIAGNOSIS — G2581 Restless legs syndrome: Secondary | ICD-10-CM | POA: Diagnosis not present

## 2020-03-31 DIAGNOSIS — Z8261 Family history of arthritis: Secondary | ICD-10-CM | POA: Insufficient documentation

## 2020-03-31 DIAGNOSIS — N202 Calculus of kidney with calculus of ureter: Secondary | ICD-10-CM | POA: Diagnosis not present

## 2020-03-31 DIAGNOSIS — N200 Calculus of kidney: Secondary | ICD-10-CM

## 2020-03-31 DIAGNOSIS — R42 Dizziness and giddiness: Secondary | ICD-10-CM | POA: Insufficient documentation

## 2020-03-31 DIAGNOSIS — Z881 Allergy status to other antibiotic agents status: Secondary | ICD-10-CM | POA: Insufficient documentation

## 2020-03-31 HISTORY — PX: EXTRACORPOREAL SHOCK WAVE LITHOTRIPSY: SHX1557

## 2020-03-31 LAB — MICROSCOPIC EXAMINATION: Bacteria, UA: NONE SEEN

## 2020-03-31 LAB — URINALYSIS, COMPLETE
Bilirubin, UA: NEGATIVE
Glucose, UA: NEGATIVE
Ketones, UA: NEGATIVE
Nitrite, UA: NEGATIVE
Protein,UA: NEGATIVE
Specific Gravity, UA: <1.005 — ABNORMAL LOW (ref 1.005–1.030)
Urobilinogen, Ur: 0.2 mg/dL (ref 0.2–1.0)
pH, UA: 5.5 (ref 5.0–7.5)

## 2020-03-31 SURGERY — LITHOTRIPSY, ESWL
Anesthesia: Moderate Sedation | Laterality: Left

## 2020-03-31 MED ORDER — DIAZEPAM 5 MG PO TABS
ORAL_TABLET | ORAL | Status: AC
  Administered 2020-03-31: 10 mg via ORAL
  Filled 2020-03-31: qty 2

## 2020-03-31 MED ORDER — DIPHENHYDRAMINE HCL 25 MG PO CAPS: 25.0000 mg | ORAL_CAPSULE | ORAL | Status: AC

## 2020-03-31 MED ORDER — HYDROCODONE-ACETAMINOPHEN 5-325 MG PO TABS: 1.0000 | ORAL_TABLET | Freq: Four times a day (QID) | ORAL | 0 refills | Status: DC | PRN

## 2020-03-31 MED ORDER — ONDANSETRON HCL 4 MG/2ML IJ SOLN
INTRAMUSCULAR | Status: AC
  Administered 2020-03-31: 4 mg via INTRAVENOUS
  Filled 2020-03-31: qty 2

## 2020-03-31 MED ORDER — CIPROFLOXACIN HCL 500 MG PO TABS
ORAL_TABLET | ORAL | Status: AC
  Administered 2020-03-31: 500 mg via ORAL
  Filled 2020-03-31: qty 1

## 2020-03-31 MED ORDER — SODIUM CHLORIDE 0.9 % IV SOLN: INTRAVENOUS | Status: DC

## 2020-03-31 MED ORDER — KETOROLAC TROMETHAMINE 30 MG/ML IJ SOLN
INTRAMUSCULAR | Status: AC
  Filled 2020-03-31: qty 1

## 2020-03-31 MED ORDER — KETOROLAC TROMETHAMINE 30 MG/ML IJ SOLN
30.0000 mg | Freq: Once | INTRAMUSCULAR | Status: AC
  Administered 2020-03-31: 30 mg via INTRAVENOUS

## 2020-03-31 MED ORDER — DIAZEPAM 5 MG PO TABS: 10.0000 mg | ORAL_TABLET | ORAL | Status: AC

## 2020-03-31 MED ORDER — ONDANSETRON HCL 4 MG/2ML IJ SOLN: 4.0000 mg | Freq: Once | INTRAMUSCULAR | Status: AC

## 2020-03-31 MED ORDER — DIPHENHYDRAMINE HCL 25 MG PO CAPS
ORAL_CAPSULE | ORAL | Status: AC
  Administered 2020-03-31: 25 mg via ORAL
  Filled 2020-03-31: qty 1

## 2020-03-31 MED ORDER — CIPROFLOXACIN HCL 500 MG PO TABS: 500.0000 mg | ORAL_TABLET | Freq: Once | ORAL | Status: AC

## 2020-03-31 NOTE — Interval H&P Note (Signed)
History and Physical Interval Note:  03/31/2020 5:10 PM  Karla [name]  has presented today for surgery, with the diagnosis of Kidney stone.  The various methods of treatment have been discussed with the patient and family. After consideration of risks, benefits and other options for treatment, the patient has consented to  Procedure(s): EXTRACORPOREAL SHOCK WAVE LITHOTRIPSY (ESWL) (Left) as a surgical intervention.  The patient's history has been reviewed, patient examined, no change in status, stable for surgery.  I have reviewed the patient's chart and labs.  Questions were answered to the patient's satisfaction.     Hollice Espy

## 2020-03-31 NOTE — Discharge Instructions (Signed)
See [facility] discharge instructions in chart.  AMBULATORY SURGERY  DISCHARGE INSTRUCTIONS   1) The drugs that you were given will stay in your system until tomorrow so for the next 24 hours you should not:  A) Drive an automobile B) Make any legal decisions C) Drink any alcoholic beverage   2) You may resume regular meals tomorrow.  Today it is better to start with liquids and gradually work up to solid foods.  You may eat anything you prefer, but it is better to start with liquids, then soup and crackers, and gradually work up to solid foods.   3) Please notify your doctor immediately if you have any unusual bleeding, trouble breathing, redness and pain at the surgery site, drainage, fever, or pain not relieved by medication.    4) Additional Instructions:        Please contact your physician with any problems or Same Day Surgery at [phone], Monday through Friday 6 am to 4 pm, or  at [facility] number at [phone].

## 2020-04-01 ENCOUNTER — Encounter: Payer: Self-pay | Admitting: Urology

## 2020-04-04 ENCOUNTER — Telehealth: Payer: Self-pay | Admitting: Urology

## 2020-04-04 NOTE — Telephone Encounter (Signed)
Pt. Called states she is in a lot of pain and has not passed anything. She can not go anywhere or do anything due to the pain. Pt. Does not want to continue pain medicine because it puts her to sleep and she wakes up in pain

## 2020-04-04 NOTE — Telephone Encounter (Signed)
Patient has been taking Flomax, staying hydrated-aware if pain worsens to notify the office or seek medical attention.

## 2020-04-05 ENCOUNTER — Ambulatory Visit (INDEPENDENT_AMBULATORY_CARE_PROVIDER_SITE_OTHER): Payer: Medicare Other | Admitting: Physician Assistant

## 2020-04-05 ENCOUNTER — Other Ambulatory Visit: Payer: Self-pay

## 2020-04-05 ENCOUNTER — Ambulatory Visit
Admission: RE | Admit: 2020-04-05 | Discharge: 2020-04-05 | Disposition: A | Discharge status: Home / Self Care | Payer: Medicare Other | Source: Ambulatory Visit | Attending: Urology | Admitting: Urology

## 2020-04-05 ENCOUNTER — Ambulatory Visit
Admission: RE | Admit: 2020-04-05 | Discharge: 2020-04-05 | Disposition: A | Discharge status: Home / Self Care | Payer: Medicare Other | Attending: Urology | Admitting: Urology

## 2020-04-05 ENCOUNTER — Encounter: Payer: Self-pay | Admitting: Physician Assistant

## 2020-04-05 VITALS — BP 113/74 | HR 62 | Temp 97.5°F | Ht 63.0 in | Wt 163.2 lb

## 2020-04-05 DIAGNOSIS — N200 Calculus of kidney: Secondary | ICD-10-CM | POA: Diagnosis not present

## 2020-04-05 LAB — URINALYSIS, COMPLETE
Bilirubin, UA: NEGATIVE
Glucose, UA: NEGATIVE
Ketones, UA: NEGATIVE
Leukocytes,UA: NEGATIVE
Nitrite, UA: NEGATIVE
Protein,UA: NEGATIVE
Specific Gravity, UA: 1.02 (ref 1.005–1.030)
Urobilinogen, Ur: 0.2 mg/dL (ref 0.2–1.0)
pH, UA: 6 (ref 5.0–7.5)

## 2020-04-05 LAB — MICROSCOPIC EXAMINATION: Bacteria, UA: NONE SEEN

## 2020-04-05 NOTE — Patient Instructions (Signed)
Continue daily Flomax and drinking fluids. Use narcotic pain medication as needed; alternatively, you may take Tylenol. If you develop pain that is uncontrollable with pain medication, fever over 101F, chills, nausea, or vomiting, call our office immediately or go to the Emergency Department.  Call the office if you need a refill on nausea or pain medication.

## 2020-04-05 NOTE — Progress Notes (Signed)
04/05/2020 11:42 AM   Karla [name] [birth year] [id number]  CC: Chief Complaint  Patient presents with  . Nephrolithiasis    HPI: Karla [name] is a 64 y.o. female with PMH UC s/p ESWL 5 days ago for management of a 10m distal left ureteral stone who presents today for evaluation of left flank pain. Operative note significant for configuration change of the stone with treatment.  She has been taking Flomax and pushing fluids as previously recommended.  Today she reports an acute episode of left flank pain 2 days ago that has since resolved. Her pain has been responsive to Norco and Tylenol. She is not having any pain today. Additionally, she has had temperatures at home of 99.0 and 100.4, 97.5 in clinic today.  KUB today reveals persistence of the distal left ureteral stone, though it does appear smaller in size by comparison.  In-office catheterized UA today positive for trace-intact blood; urine microscopy pan-negative.   PMH: Past Medical History:  Diagnosis Date  . GERD (gastroesophageal reflux disease)   . Insomnia   . Restless leg syndrome   . Ulcerative colitis ([place]   . Vertigo     Surgical History: Past Surgical History:  Procedure Laterality Date  . EXTRACORPOREAL SHOCK WAVE LITHOTRIPSY Left 03/31/2020   Procedure: EXTRACORPOREAL SHOCK WAVE LITHOTRIPSY (ESWL);  Surgeon: BHollice Espy MD;  Location: [facility] ORS;  Service: Urology;  Laterality: Left;  . NECK SURGERY    . SHOULDER SURGERY Left     Home Medications:  Allergies as of 04/05/2020      Reactions   Mesalamine    Other reaction(s): Other (See Comments), Other (See Comments) pancreatitis Pancreatitis Pancreatitis   Prednisone Itching, Palpitations   Other reaction(s): Other (See Comments) Hair loss Alopecia   Amoxicillin Other (See Comments)   Other reaction(s): Unknown unknown unknown unknown   Metronidazole Nausea And Vomiting   Abdominal pain, dysgeusia Abdominal pain,  dysgeusia Abdominal pain, dysgeusia   Trazodone And Nefazodone Other (See Comments)   Other reaction(s): Other (See Comments) Severe back pack with muscle aches Severe back pack with muscle aches Severe back pack with muscle aches      Medication List       Accurate as of April 05, 2020 11:42 AM. If you have any questions, ask your nurse or doctor.        acetaminophen 325 MG tablet Commonly known as: TYLENOL Take by mouth.   celecoxib 100 MG capsule Commonly known as: CELEBREX Take by mouth.   citalopram 40 MG tablet Commonly known as: CELEXA TAKE 1 TABLET BY MOUTH ONCE DAILY   docusate sodium 100 MG capsule Commonly known as: Colace Take 1 tablet once or twice daily as needed for constipation while taking narcotic pain medicine   HYDROcodone-acetaminophen 5-325 MG tablet Commonly known as: NORCO/VICODIN Take 1-2 tablets by mouth every 6 (six) hours as needed for moderate pain or severe pain.   melatonin 3 MG Tabs tablet Take 1 tablet (3 mg total) by mouth at bedtime.   omeprazole 20 MG capsule Commonly known as: PRILOSEC Take by mouth.   ondansetron 8 MG tablet Commonly known as: ZOFRAN Take 1 tablet (8 mg total) by mouth every 8 (eight) hours as needed for nausea or vomiting.   STELARA [place] Inject into the skin. Injection every 8 weeks   Stelara 45 MG/0.5ML Soln Generic drug: Ustekinumab   tamsulosin 0.4 MG Caps capsule Commonly known as: FLOMAX Take 1 tablet by mouth daily until you pass the kidney  stone or no longer have symptoms   traZODone 50 MG tablet Commonly known as: DESYREL Take by mouth.       Allergies:  Allergies  Allergen Reactions  . Mesalamine     Other reaction(s): Other (See Comments), Other (See Comments) pancreatitis Pancreatitis Pancreatitis   . Prednisone Itching and Palpitations    Other reaction(s): Other (See Comments) Hair loss Alopecia   . Amoxicillin Other (See Comments)    Other reaction(s):  Unknown unknown unknown unknown   . Metronidazole Nausea And Vomiting    Abdominal pain, dysgeusia Abdominal pain, dysgeusia Abdominal pain, dysgeusia   . Trazodone And Nefazodone Other (See Comments)    Other reaction(s): Other (See Comments) Severe back pack with muscle aches Severe back pack with muscle aches Severe back pack with muscle aches     Family History: Family History  Problem Relation Age of Onset  . Congestive Heart Failure Mother   . COPD Mother   . Macular degeneration Mother   . Vitiligo Mother   . Arthritis Father   . Diabetes Father     Social History:   reports that she has never smoked. She has never used smokeless tobacco. She reports that she does not drink alcohol and does not use drugs.  Physical Exam: BP 113/74 (BP Location: Left Arm, Patient Position: Sitting, Cuff Size: Normal)   Pulse 62   Temp (!) 97.5 F (36.4 C) (Oral)   Ht 5' 3"  (1.6 m)   Wt 163 lb 3.2 oz (74 kg)   BMI 28.91 kg/m   Constitutional:  Alert and oriented, no acute distress, nontoxic appearing HEENT: [place], AT Cardiovascular: No clubbing, cyanosis, or edema Respiratory: Normal respiratory effort, no increased work of breathing Skin: No rashes, bruises or suspicious lesions Neurologic: Grossly intact, no focal deficits, moving all 4 extremities Psychiatric: Normal mood and affect  Laboratory Data: Results for orders placed or performed in visit on 04/05/20  Microscopic Examination   Urine  Result Value Ref Range   WBC, UA 6-10 (A) 0 - 5 /hpf   RBC 0-2 0 - 2 /hpf   Epithelial Cells (non renal) 0-10 0 - 10 /hpf   Bacteria, UA None seen None seen/Few  Urinalysis, Complete  Result Value Ref Range   Specific Gravity, UA 1.020 1.005 - 1.030   pH, UA 6.0 5.0 - 7.5   Color, UA Yellow Yellow   Appearance Ur Clear Clear   Leukocytes,UA Negative Negative   Protein,UA Negative Negative/Trace   Glucose, UA Negative Negative   Ketones, UA Negative Negative   RBC, UA Trace  (A) Negative   Bilirubin, UA Negative Negative   Urobilinogen, Ur 0.2 0.2 - 1.0 mg/dL   Nitrite, UA Negative Negative   Microscopic Examination See below:    Pertinent Imaging: KUB, 04/05/2020: CLINICAL DATA:  Follow-up left nephrolithiasis.  Recent lithotripsy.  EXAM: ABDOMEN - 1 VIEW  COMPARISON:  Abdominal x-ray dated March 31, 2020.  FINDINGS: The bowel gas pattern is normal. Moderate colonic stool burden. Previously seen calcification in the left lower pelvis is no longer identified. No radio-opaque calculi or other significant radiographic abnormality are seen.  IMPRESSION: 1. Previously seen left UVJ calculus is no longer identified.   Electronically Signed   By: Titus Dubin M.D.   On: 04/05/2020 15:14  I personally reviewed the images referenced above and note a stable distal left ureteral stone, apparently changed in configuration.  Assessment & Plan:   1. Nephrolithiasis Urine reassuring for infection, VSS, pain  responsive to medications. Counseled the patient that she is experiencing typical discomfort with residual fragment passage following ESWL. Counseled her to continue Flomax and pushing fluids and to treat pain as needed. Offered her refill of pain and nausea meds as needed. She expressed understanding. - Urinalysis, Complete  Return if symptoms worsen or fail to improve.  Debroah Loop, PA-C  [facility] [address], [place], [place] [address] [phone]

## 2020-04-07 ENCOUNTER — Telehealth: Payer: Self-pay

## 2020-04-07 NOTE — Telephone Encounter (Signed)
Patient advised as directed below. 

## 2020-04-07 NOTE — Telephone Encounter (Signed)
-----   Message from [name], PA-C sent at 04/06/2020  1:46 PM EDT ----- There is moderate stool burden noted consistent with constipation. Previously noted ureteral stone is no longer present.

## 2020-04-08 ENCOUNTER — Other Ambulatory Visit (HOSPITAL_COMMUNITY)
Admission: RE | Admit: 2020-04-08 | Discharge: 2020-04-08 | Disposition: A | Discharge status: Home / Self Care | Payer: Medicare Other | Source: Ambulatory Visit | Attending: Physician Assistant | Admitting: Physician Assistant

## 2020-04-08 ENCOUNTER — Encounter: Payer: Self-pay | Admitting: Physician Assistant

## 2020-04-08 ENCOUNTER — Ambulatory Visit (INDEPENDENT_AMBULATORY_CARE_PROVIDER_SITE_OTHER): Payer: Medicare Other | Admitting: Physician Assistant

## 2020-04-08 ENCOUNTER — Other Ambulatory Visit: Payer: Self-pay

## 2020-04-08 VITALS — BP 107/74 | HR 56 | Temp 97.5°F | Resp 16 | Ht 63.0 in | Wt 163.8 lb

## 2020-04-08 DIAGNOSIS — Z0000 Encounter for general adult medical examination without abnormal findings: Secondary | ICD-10-CM | POA: Diagnosis not present

## 2020-04-08 DIAGNOSIS — Z23 Encounter for immunization: Secondary | ICD-10-CM | POA: Diagnosis not present

## 2020-04-08 DIAGNOSIS — Z6829 Body mass index (BMI) 29.0-29.9, adult: Secondary | ICD-10-CM | POA: Diagnosis not present

## 2020-04-08 DIAGNOSIS — Z1151 Encounter for screening for human papillomavirus (HPV): Secondary | ICD-10-CM | POA: Diagnosis not present

## 2020-04-08 DIAGNOSIS — Z124 Encounter for screening for malignant neoplasm of cervix: Secondary | ICD-10-CM | POA: Diagnosis not present

## 2020-04-08 DIAGNOSIS — Z1231 Encounter for screening mammogram for malignant neoplasm of breast: Secondary | ICD-10-CM | POA: Diagnosis not present

## 2020-04-08 DIAGNOSIS — H9192 Unspecified hearing loss, left ear: Secondary | ICD-10-CM

## 2020-04-08 NOTE — Progress Notes (Signed)
I,Karla [name],acting as a scribe for [employer], PA-C.,have documented all relevant documentation on the behalf of Karla Daring, PA-C,as directed by  Karla Daring, PA-C while in the presence of Karla [name], [place].  Medicare Initial Preventative Physical Exam    Patient: Karla [name], Female    DOB: 08/25/1956, 64 y.o.   MRN: [id number] Visit Date: 04/08/2020  Today's Provider: [name], PA-C   Chief Complaint  Patient presents with  . Medicare Wellness   Subjective    Medicare Initial Preventative Physical Exam Karla [name] is a 64 y.o. female who presents today for her Initial Preventative Physical Exam. Patient has no acute complaints today.  HPI  Social History   Socioeconomic History  . Marital status: Single    Spouse name: Not on file  . Number of children: Not on file  . Years of education: Not on file  . Highest education level: Not on file  Occupational History  . Not on file  Tobacco Use  . Smoking status: Never Smoker  . Smokeless tobacco: Never Used  Vaping Use  . Vaping Use: Never used  Substance and Sexual Activity  . Alcohol use: Never  . Drug use: Never  . Sexual activity: Not Currently  Other Topics Concern  . Not on file  Social History Narrative  . Not on file   Social Determinants of Health   Financial Resource Strain:   . Difficulty of Paying Living Expenses:   Food Insecurity:   . Worried About Charity fundraiser in the Last Year:   . Arboriculturist in the Last Year:   Transportation Needs:   . Film/video editor (Medical):   Marland Kitchen Lack of Transportation (Non-Medical):   Physical Activity:   . Days of Exercise per Week:   . Minutes of Exercise per Session:   Stress:   . Feeling of Stress :   Social Connections:   . Frequency of Communication with Friends and Family:   . Frequency of Social Gatherings with Friends and Family:   . Attends Religious Services:   . Active Member of  Clubs or Organizations:   . Attends Archivist Meetings:   Marland Kitchen Marital Status:   Intimate Partner Violence:   . Fear of Current or Ex-Partner:   . Emotionally Abused:   Marland Kitchen Physically Abused:   . Sexually Abused:     Past Medical History:  Diagnosis Date  . GERD (gastroesophageal reflux disease)   . Insomnia   . Restless leg syndrome   . Ulcerative colitis ([place])   . Vertigo      Patient Active Problem List   Diagnosis Date Noted  . Hiatal hernia 05/04/2019  . 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 Depression 05/14/2018  . 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 History of Clostridium difficile infection 06/03/2012  . IBS (irritable bowel syndrome) 06/03/2012  . IFG (impaired fasting glucose) 06/03/2012  . Memory loss 06/03/2012  . Multinodular goiter 06/03/2012  . Obstructive sleep apnea 06/03/2012  . Stress incontinence in female 06/03/2012  . Other osteoporosis without current pathological fracture 06/03/2012    Past Surgical History:  Procedure Laterality Date  . EXTRACORPOREAL SHOCK WAVE LITHOTRIPSY Left 03/31/2020   Procedure: EXTRACORPOREAL SHOCK WAVE LITHOTRIPSY (ESWL);  Surgeon: Hollice Espy, MD;  Location: [facility] ORS;  Service: Urology;  Laterality: Left;  . NECK SURGERY    . SHOULDER SURGERY Left     Her family history includes  Arthritis in her father; COPD in her mother; Congestive Heart Failure in her mother; Diabetes in her father; Macular degeneration in her mother; Vitiligo in her mother.   Current Outpatient Medications:  .  acetaminophen (TYLENOL) 325 MG tablet, Take by mouth., Disp: , Rfl:  .  citalopram (CELEXA) 40 MG tablet, TAKE 1 TABLET BY MOUTH ONCE DAILY, Disp: , Rfl:  .  docusate sodium (COLACE) 100 MG capsule, Take 1 tablet once or twice daily as needed for constipation while taking narcotic pain medicine, Disp: 30 capsule, Rfl: 0 .  melatonin 3 MG TABS tablet, Take 1 tablet (3 mg total) by mouth at bedtime.,  Disp: , Rfl: 0 .  STELARA 45 MG/0.5ML SOLN, , Disp: , Rfl:  .  tamsulosin (FLOMAX) 0.4 MG CAPS capsule, Take 1 tablet by mouth daily until you pass the kidney stone or no longer have symptoms, Disp: 14 capsule, Rfl: 0 .  omeprazole (PRILOSEC) 20 MG capsule, Take by mouth., Disp: , Rfl:    Patient Care Team: Rubye Beach as PCP - General (Family Medicine)  Review of Systems  Constitutional: Negative.   HENT: Positive for hearing loss and sneezing.   Eyes: Positive for photophobia.  Respiratory: Negative.   Cardiovascular: Negative.   Gastrointestinal: Positive for abdominal distention and constipation.  Endocrine: Negative.   Genitourinary: Positive for flank pain.  Musculoskeletal: Negative for arthralgias, gait problem and myalgias.  Skin: Negative.   Allergic/Immunologic: Negative.   Neurological: Negative.   Hematological: Negative.   Psychiatric/Behavioral: Positive for decreased concentration.      Objective    Vitals: BP 107/74 (BP Location: Left Arm, Patient Position: Sitting, Cuff Size: Normal)   Pulse (!) 56   Temp (!) 97.5 F (36.4 C) (Temporal)   Resp 16   Ht 5' 3"  (1.6 m)   Wt 163 lb 12.8 oz (74.3 kg)   BMI 29.02 kg/m   Hearing Screening   125Hz  250Hz  500Hz  1000Hz  2000Hz  3000Hz  4000Hz  6000Hz  8000Hz   Right ear:   Pass Pass Pass  Pass    Left ear:   40 40 20  20      Visual Acuity Screening   Right eye Left eye Both eyes  Without correction: 20/70 40/40 20/30   With correction:       Hearing Screening   125Hz  250Hz  500Hz  1000Hz  2000Hz  3000Hz  4000Hz  6000Hz  8000Hz   Right ear:   Pass Pass Pass  Pass    Left ear:   40 40 20  20      Visual Acuity Screening   Right eye Left eye Both eyes  Without correction: 20/70 40/40 20/30   With correction:       Physical Exam Vitals reviewed. Exam conducted with a chaperone present.  Constitutional:      General: She is not in acute distress.    Appearance: Normal appearance. She is well-developed,  well-groomed and overweight. She is not ill-appearing or diaphoretic.  HENT:     Head: Normocephalic and atraumatic.     Right Ear: Hearing, tympanic membrane, ear canal and external ear normal.     Left Ear: Hearing, tympanic membrane, ear canal and external ear normal.     Nose: Nose normal.     Mouth/Throat:     Mouth: Mucous membranes are moist.     Pharynx: Oropharynx is clear. Uvula midline. No oropharyngeal exudate.  Eyes:     General: No scleral icterus.       Right eye: No discharge.  Left eye: No discharge.     Extraocular Movements: Extraocular movements intact.     Conjunctiva/sclera: Conjunctivae normal.     Pupils: Pupils are equal, round, and reactive to light.  Neck:     Thyroid: No thyroid mass, thyromegaly or thyroid tenderness.     Vascular: No carotid bruit or JVD.     Trachea: No tracheal deviation.  Cardiovascular:     Rate and Rhythm: Normal rate and regular rhythm.     Pulses: Normal pulses.     Heart sounds: Normal heart sounds. No murmur heard.  No friction rub. No gallop.   Pulmonary:     Effort: Pulmonary effort is normal. No respiratory distress.     Breath sounds: Normal breath sounds. No decreased breath sounds, wheezing, rhonchi or rales.  Chest:     Chest wall: No mass or tenderness.     Breasts: Breasts are symmetrical.        Right: Normal. No inverted nipple, mass, nipple discharge, skin change or tenderness.        Left: Normal. No inverted nipple, mass, nipple discharge, skin change or tenderness.  Abdominal:     General: Abdomen is flat. Bowel sounds are normal. There is no distension.     Palpations: Abdomen is soft. There is no mass.     Tenderness: There is no abdominal tenderness. There is no guarding or rebound.     Hernia: There is no hernia in the left inguinal area.  Genitourinary:    General: Normal vulva.     Exam position: Supine.     Labia:        Right: No rash, tenderness, lesion or injury.        Left: No rash,  tenderness, lesion or injury.      Vagina: Normal. No signs of injury. No vaginal discharge, erythema, tenderness or bleeding.     Cervix: No cervical motion tenderness, discharge or friability.     Adnexa:        Right: No mass, tenderness or fullness.         Left: No mass, tenderness or fullness.       Rectum: Normal.  Musculoskeletal:        General: No tenderness. Normal range of motion.     Cervical back: Normal range of motion and neck supple.  Lymphadenopathy:     Cervical: No cervical adenopathy.  Skin:    General: Skin is warm and dry.     Capillary Refill: Capillary refill takes less than 2 seconds.     Findings: No rash.  Neurological:     General: No focal deficit present.     Mental Status: She is alert and oriented to person, place, and time. Mental status is at baseline.     Cranial Nerves: No cranial nerve deficit.     Coordination: Coordination normal.     Deep Tendon Reflexes: Reflexes are normal and symmetric.  Psychiatric:        Mood and Affect: Mood normal.        Behavior: Behavior normal. Behavior is cooperative.        Thought Content: Thought content normal.        Judgment: Judgment normal.     Activities of Daily Living In your present state of health, do you have any difficulty performing the following activities: 04/08/2020 02/04/2020  Hearing? N N  Vision? N N  Difficulty concentrating or making decisions? N Y  Walking or climbing stairs? N  N  Dressing or bathing? N N  Doing errands, shopping? N N  Some recent data might be hidden    Fall Risk Assessment Fall Risk  04/08/2020 02/04/2020  Falls in the past year? 1 1  Number falls in past yr: 0 0  Injury with Fall? 1 1     Depression Screen PHQ 2/9 Scores 04/08/2020 02/04/2020 05/04/2019  PHQ - 2 Score 2 0 0  PHQ- 9 Score 6 0 2    6CIT Screen 04/08/2020  What Year? 0 points  What month? 0 points  What time? 0 points  Count back from 20 0 points  Months in reverse 0 points  Repeat  phrase 4 points  Total Score 4    No results found for any visits on 04/08/20.  Assessment & Plan      Initial Preventative Physical Exam  Reviewed patient's Family Medical History Reviewed and updated list of patient's medical providers Assessment of cognitive impairment was done Assessed patient's functional ability Established a written schedule for health screening [place] Completed and Reviewed  Exercise Activities and Dietary recommendations Goals   None     Immunization History  Administered Date(s) Administered  . Influenza Split 08/11/2012  . Influenza,inj,Quad PF,6+ Mos 06/06/2018  . PPD Test 05/16/2018  . Pneumococcal Conjugate-13 08/28/2018  . Pneumococcal Polysaccharide-23 04/08/2020  . Tdap 05/23/2010    Health Maintenance  Topic Date Due  . Hepatitis C Screening  Never done  . COVID-19 Vaccine (1) Never done  . HIV Screening  Never done  . PAP SMEAR-Modifier  Never done  . INFLUENZA VACCINE  04/24/2020  . TETANUS/TDAP  05/23/2020  . MAMMOGRAM  03/12/2021  . COLONOSCOPY  11/25/2028  . DEXA SCAN  Completed  . PNA vac Low Risk Adult  Completed     Discussed health benefits of physical activity, and encouraged her to engage in regular exercise appropriate for her age and condition.   1. Welcome to Medicare preventive visit Normal physical exam today. Up to date on screenings and vaccinations. She is to call the office in the meantime if she has any acute issue, questions or concerns.  EKG today shows sinus bradycardia rate of 47 with non-specific T wave abnormality. EKG stable from 2011.  - EKG 12-Lead  2. Need for pneumococcal vaccine Pneumoccocal vaccine given today without complication. Patient sat upright for 15 minutes to check for adverse reaction before being released. - Pneumococcal polysaccharide vaccine 23-valent greater than or equal to 2yo subcutaneous/IM  3. BMI 29.0-29.9,adult Counseled patient on healthy  lifestyle modifications including dieting and exercise.   4. Encounter for screening mammogram for malignant neoplasm of breast Breast exam today was normal. She does not perform regular self breast exams. Mammogram was ordered as below.Patient scheduled for Mammogram.    5. Cervical cancer screening Pap collected today. Will send as below and f/u pending results. - Cytology - PAP  6. Decreased hearing of left ear Patient is noted to have decreased hearing in the left ear on hearing test done today using pure tone audiometer. Gross hearing ok. Discussed referral to audiologist for official hearing test. She prefers to wait at this time and will call if desires in the future.   No follow-ups on file.     Reynolds Bowl, PA-C, have reviewed all documentation for this visit. The documentation on 04/12/20 for the exam, diagnosis, procedures, and orders are all accurate and complete.   Karla Daring, PA-C  [facility] [phone] (phone) [phone] (fax)  [place]

## 2020-04-08 NOTE — Patient Instructions (Signed)

## 2020-04-12 LAB — CYTOLOGY - PAP
Comment: NEGATIVE
Diagnosis: NEGATIVE
High risk HPV: NEGATIVE

## 2020-04-13 ENCOUNTER — Telehealth: Payer: Self-pay

## 2020-04-13 NOTE — Telephone Encounter (Signed)
-----   Message from [name], [place] sent at 04/12/2020  4:15 PM EDT ----- Pap is normal, HPV negative.  No need to repeat unless issues arise.

## 2020-04-13 NOTE — Telephone Encounter (Signed)
Written by [name], PA-C on 04/12/2020 4:15 PM EDT Seen by patient Karla [name] on 04/12/2020 6:00 PM

## 2020-04-15 DIAGNOSIS — Z1231 Encounter for screening mammogram for malignant neoplasm of breast: Secondary | ICD-10-CM | POA: Diagnosis not present

## 2020-04-18 ENCOUNTER — Telehealth: Admit: 2020-04-18 | Discharge: 2020-04-19 | Payer: MEDICARE | Attending: Gastroenterology | Primary: Gastroenterology

## 2020-04-18 DIAGNOSIS — R12 Heartburn: Secondary | ICD-10-CM | POA: Diagnosis not present

## 2020-04-18 DIAGNOSIS — M810 Age-related osteoporosis without current pathological fracture: Secondary | ICD-10-CM | POA: Diagnosis not present

## 2020-04-18 DIAGNOSIS — K51019 Ulcerative (chronic) pancolitis with unspecified complications: Secondary | ICD-10-CM | POA: Diagnosis not present

## 2020-04-18 LAB — HM MAMMOGRAPHY

## 2020-04-19 ENCOUNTER — Encounter: Payer: Self-pay | Admitting: Physician Assistant

## 2020-04-27 ENCOUNTER — Ambulatory Visit: Payer: Medicare Other | Admitting: Urology

## 2020-05-16 ENCOUNTER — Other Ambulatory Visit: Payer: Self-pay

## 2020-05-16 DIAGNOSIS — N200 Calculus of kidney: Secondary | ICD-10-CM

## 2020-05-17 ENCOUNTER — Ambulatory Visit: Payer: Self-pay | Admitting: Physician Assistant

## 2020-05-24 ENCOUNTER — Ambulatory Visit: Payer: Self-pay | Admitting: Physician Assistant

## 2020-06-06 ENCOUNTER — Encounter: Payer: Self-pay | Admitting: Physician Assistant

## 2020-06-06 MED ORDER — MELOXICAM 7.5 MG PO TABS
7.5000 mg | ORAL_TABLET | Freq: Every day | ORAL | 0 refills | Status: DC
Start: 2020-06-06 — End: 2020-08-22

## 2020-06-08 DIAGNOSIS — Z0120 Encounter for dental examination and cleaning without abnormal findings: Secondary | ICD-10-CM | POA: Diagnosis not present

## 2020-06-20 ENCOUNTER — Encounter: Payer: Self-pay | Admitting: Physician Assistant

## 2020-06-20 NOTE — Telephone Encounter (Signed)
Can we schedule Karla [name]'s mother for a new patient appt please?  I have let her know we are most likely out into October or later for a new patient appt.

## 2020-06-21 NOTE — Telephone Encounter (Signed)
Please call to schedule her mother as a new patient here to establish care.

## 2020-06-22 NOTE — Telephone Encounter (Signed)
Called patient to give a new patient appointment for her mother to establish care with Fenton Malling, PA-C If [name] calls back is ok for [facility] admin pool to schedule a new patient appointment for her mother since it was approved by the provider. Thanks.

## 2020-06-23 DIAGNOSIS — K51019 Ulcerative (chronic) pancolitis with unspecified complications: Principal | ICD-10-CM

## 2020-06-23 MED ORDER — USTEKINUMAB 90 MG/ML SUBCUTANEOUS SYRINGE
5 refills | 0 days | Status: CP
Start: 2020-06-23 — End: ?

## 2020-06-27 NOTE — Telephone Encounter (Signed)
Do I have any other new patient appt sooner than 08/01/20?

## 2020-06-27 NOTE — Telephone Encounter (Signed)
Karla [name] front desk information to call Karla [name] to schedule her mother for a new patient appointment.

## 2020-07-19 DIAGNOSIS — K5190 Ulcerative colitis, unspecified, without complications: Secondary | ICD-10-CM | POA: Diagnosis not present

## 2020-07-24 MED ORDER — OMEPRAZOLE 20 MG CAPSULE,DELAYED RELEASE
ORAL_CAPSULE | 1 refills | 0 days
Start: 2020-07-24 — End: ?

## 2020-07-26 ENCOUNTER — Encounter: Payer: Self-pay | Admitting: Physician Assistant

## 2020-08-22 ENCOUNTER — Encounter: Payer: Self-pay | Admitting: Physician Assistant

## 2020-08-22 DIAGNOSIS — M79641 Pain in right hand: Secondary | ICD-10-CM

## 2020-08-22 DIAGNOSIS — M25561 Pain in right knee: Secondary | ICD-10-CM

## 2020-08-22 DIAGNOSIS — M25559 Pain in unspecified hip: Secondary | ICD-10-CM

## 2020-08-22 DIAGNOSIS — M542 Cervicalgia: Secondary | ICD-10-CM

## 2020-08-22 DIAGNOSIS — G479 Sleep disorder, unspecified: Secondary | ICD-10-CM

## 2020-08-22 MED ORDER — MELOXICAM 15 MG PO TABS
15.0000 mg | ORAL_TABLET | Freq: Every day | ORAL | 0 refills | Status: DC
Start: 2020-08-22 — End: 2020-08-23

## 2020-08-22 MED ORDER — TEMAZEPAM 15 MG PO CAPS: 15.0000 mg | ORAL_CAPSULE | Freq: Every evening | ORAL | 0 refills | Status: DC | PRN

## 2020-08-22 NOTE — Addendum Note (Signed)
Addended by: [name] on: 08/22/2020 05:37 PM   Modules accepted: Orders

## 2020-08-23 ENCOUNTER — Telehealth: Payer: Self-pay | Admitting: Physician Assistant

## 2020-08-23 MED ORDER — MELOXICAM 15 MG PO TABS: 15.0000 mg | ORAL_TABLET | Freq: Every day | ORAL | 0 refills | Status: DC

## 2020-08-23 NOTE — Telephone Encounter (Signed)
[place] faxed refill request for the following medications:  90 day supply: meloxicam (MOBIC) 15 MG tablet  Please advise. Thanks, [employer]

## 2020-08-24 NOTE — Addendum Note (Signed)
Addended by: [name] on: 08/24/2020 12:37 PM   Modules accepted: Orders

## 2020-08-28 ENCOUNTER — Encounter: Payer: Self-pay | Admitting: Physician Assistant

## 2020-08-29 ENCOUNTER — Encounter: Payer: Self-pay | Admitting: Physician Assistant

## 2020-09-01 ENCOUNTER — Ambulatory Visit
Admission: RE | Admit: 2020-09-01 | Discharge: 2020-09-01 | Disposition: A | Discharge status: Home / Self Care | Payer: Medicare Other | Attending: Physician Assistant | Admitting: Physician Assistant

## 2020-09-01 ENCOUNTER — Ambulatory Visit
Admission: RE | Admit: 2020-09-01 | Discharge: 2020-09-01 | Disposition: A | Discharge status: Home / Self Care | Payer: Medicare Other | Source: Ambulatory Visit | Attending: Physician Assistant | Admitting: Physician Assistant

## 2020-09-01 DIAGNOSIS — M25561 Pain in right knee: Secondary | ICD-10-CM | POA: Diagnosis not present

## 2020-09-01 DIAGNOSIS — M25559 Pain in unspecified hip: Secondary | ICD-10-CM

## 2020-09-01 DIAGNOSIS — M79641 Pain in right hand: Secondary | ICD-10-CM | POA: Insufficient documentation

## 2020-09-01 DIAGNOSIS — M542 Cervicalgia: Secondary | ICD-10-CM | POA: Insufficient documentation

## 2020-09-21 ENCOUNTER — Other Ambulatory Visit: Payer: Self-pay | Admitting: Physician Assistant

## 2020-09-21 NOTE — Telephone Encounter (Signed)
Dorothe Pea - Daughter calling for patient needing a refill asap on citalopram (CELEXA) 40 MG tablet. Apolonio Schneiders states the Education officer, museum and the pharmacy has tried to call office to get this medication filled for patient.  No call back.  Will be leaving out in a day needing this filled for patient before then.  Please call Apolonio Schneiders back at [phone].  Thanks. [place]

## 2020-09-22 MED ORDER — CITALOPRAM HYDROBROMIDE 40 MG PO TABS: 40.0000 mg | ORAL_TABLET | Freq: Every day | ORAL | 1 refills | Status: DC

## 2020-09-22 NOTE — Telephone Encounter (Addendum)
Patients daughter called back requesting an update on this prescription. Patient is completely out of medication and needs it sent in ASAP. Please review and advise. This medication is listed as historical.

## 2020-09-22 NOTE — Telephone Encounter (Signed)
Patients daughter requesting callback once prescription has been to pharmacy today. Please advise

## 2020-09-22 NOTE — Telephone Encounter (Signed)
Patient's daughter advised.

## 2020-10-27 MED ORDER — OMEPRAZOLE 20 MG CAPSULE,DELAYED RELEASE
ORAL_CAPSULE | 1 refills | 0 days
Start: 2020-10-27 — End: ?

## 2020-10-28 ENCOUNTER — Other Ambulatory Visit: Payer: Self-pay | Admitting: Physician Assistant

## 2020-10-28 MED ORDER — OMEPRAZOLE 20 MG PO CPDR: 20.0000 mg | DELAYED_RELEASE_CAPSULE | Freq: Every day | ORAL | 3 refills | Status: DC

## 2020-10-28 NOTE — Telephone Encounter (Signed)
Refilled to [facility]

## 2020-10-28 NOTE — Telephone Encounter (Signed)
Patient is requesting omeprazole to be called in for GERD symptoms. Please advise. Thanks!

## 2020-10-28 NOTE — Telephone Encounter (Signed)
Requested medication (s) are due for refill today:   Ordered 2 yrs ago during a visit to [facility] urgent care in [facility] 05/05/2018  Requested medication (s) are on the active medication list:   Yes   Future visit scheduled:   No saw Fenton Malling 6 mo. ago   Last ordered: 05/05/2018 during urgent care visit  Clinic note:  Returned because pt has called in requested this to be filled today because she is going out of town today.   I don't see where she has been taking this.  Forwarded for Anderson Malta to consider this request.      Requested Prescriptions  Pending Prescriptions Disp Refills   omeprazole (PRILOSEC) 20 MG capsule      Sig: Take by mouth.      Gastroenterology: Proton Pump Inhibitors Passed - 10/28/2020  9:48 AM      Passed - Valid encounter within last 12 months    Recent Outpatient Visits           6 months ago Welcome to [employer] preventive visit   [place], [place]   8 months ago Chronic universal ulcerative colitis [facility])   [facility] [place], [name], [place]   1 year ago Chronic universal ulcerative colitis [facility])   [facility] [name], NP

## 2020-10-28 NOTE — Telephone Encounter (Signed)
Medication Refill - Medication: Omeprazole   Has the patient contacted their pharmacy? Yes.   Pt called stating that she contacted pharmacy, but that they told her to contact PCP due to her having to go out of town tomorrow. Pt is requesting to have this sent in today. Please advise.  (Agent: If no, request that the patient contact the pharmacy for the refill.) (Agent: If yes, when and what did the pharmacy advise?)  Preferred Pharmacy (with phone number or street name):  [facility] [place], [address]  [place] [address]  Phone: [phone] Fax: [phone]  Hours: Not open 24 hours     Agent: Please be advised that RX refills may take up to 3 business days. We ask that you follow-up with your pharmacy.

## 2020-11-10 ENCOUNTER — Telehealth: Admit: 2020-11-10 | Discharge: 2020-11-11 | Payer: MEDICARE | Attending: Gastroenterology | Primary: Gastroenterology

## 2020-11-10 DIAGNOSIS — K51019 Ulcerative (chronic) pancolitis with unspecified complications: Secondary | ICD-10-CM | POA: Diagnosis not present

## 2020-11-10 MED ORDER — OMEPRAZOLE 20 MG CAPSULE,DELAYED RELEASE
ORAL_CAPSULE | 3 refills | 0 days | Status: CP
Start: 2020-11-10 — End: ?

## 2020-11-11 DIAGNOSIS — K5190 Ulcerative colitis, unspecified, without complications: Secondary | ICD-10-CM | POA: Diagnosis not present

## 2020-11-15 ENCOUNTER — Telehealth: Payer: Self-pay

## 2020-11-15 NOTE — Telephone Encounter (Signed)
Will need to schedule appt for referrals.

## 2020-11-15 NOTE — Telephone Encounter (Signed)
Apt 11/17/2020 at 10am  Thanks,   -Mickel Baas

## 2020-11-15 NOTE — Telephone Encounter (Signed)
Copied from [place] [phone]. Topic: General - Other >> Nov 15, 2020 11:51 AM Leward Quan A wrote: Reason for CRM: Patient called in to inquire of Fenton Malling that she is now starting to have pain in her left hip and it sometimes make it difficult to walk. Need to know what the next step say that she dont want to keep masking the pain with Tylenols but may need some therapy and to see a specialist. Please advise Ph# [phone]

## 2020-11-17 ENCOUNTER — Other Ambulatory Visit: Payer: Self-pay

## 2020-11-17 ENCOUNTER — Encounter: Payer: Self-pay | Admitting: Physician Assistant

## 2020-11-17 ENCOUNTER — Telehealth (INDEPENDENT_AMBULATORY_CARE_PROVIDER_SITE_OTHER): Payer: Medicare Other | Admitting: Physician Assistant

## 2020-11-17 DIAGNOSIS — M160 Bilateral primary osteoarthritis of hip: Secondary | ICD-10-CM | POA: Diagnosis not present

## 2020-11-17 DIAGNOSIS — M7062 Trochanteric bursitis, left hip: Secondary | ICD-10-CM

## 2020-11-17 DIAGNOSIS — M7061 Trochanteric bursitis, right hip: Secondary | ICD-10-CM | POA: Diagnosis not present

## 2020-11-17 NOTE — Progress Notes (Signed)
MyChart Video Visit    Virtual Visit via Video Note   This visit type was conducted due to national recommendations for restrictions regarding the COVID-19 Pandemic (e.g. social distancing) in an effort to limit this patient's exposure and mitigate transmission in our community. This patient is at least at moderate risk for complications without adequate follow up. This format is felt to be most appropriate for this patient at this time. Physical exam was limited by quality of the video and audio technology used for the visit.   Patient location: Home Provider location: [facility]  I discussed the limitations of evaluation and management by telemedicine and the availability of in person appointments. The patient expressed understanding and agreed to proceed.  Patient: Karla [name]   DOB: 1955/12/03   65 y.o. Female  MRN: [id number] Visit Date: 11/17/2020  Today's healthcare provider: [name], PA-C   Chief Complaint  Patient presents with   Hip Pain   Subjective    Hip Pain  Incident onset: 4 months ago; started in right hip then progressed into left hip. There was no injury mechanism. The pain is present in the left hip and right hip (radiates down legs). Quality: varies from sharp to tender. The pain has been intermittent since onset. Associated symptoms include an inability to bear weight and muscle weakness. Pertinent negatives include no numbness or tingling. Associated symptoms comments: Joint pain in hands. She has tried acetaminophen and heat for the symptoms. The treatment provided moderate relief.    Pain worsens at night. Difficulty laying on hips.  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   History of Clostridium difficile infection 06/03/2012   IBS (irritable bowel syndrome) 06/03/2012    IFG (impaired fasting glucose) 06/03/2012   Memory loss 06/03/2012   Multinodular goiter 06/03/2012   Obstructive sleep apnea 06/03/2012   Stress incontinence in female 06/03/2012   Other osteoporosis without current pathological fracture 06/03/2012   Past Medical History:  Diagnosis Date   GERD (gastroesophageal reflux disease)    Insomnia    Restless leg syndrome    Ulcerative colitis ([place])    Vertigo       Medications: Outpatient Medications Prior to Visit  Medication Sig   acetaminophen (TYLENOL) 325 MG tablet Take by mouth.   citalopram (CELEXA) 40 MG tablet Take 1 tablet (40 mg total) by mouth daily.   melatonin 3 MG TABS tablet Take 1 tablet (3 mg total) by mouth at bedtime.   omeprazole (PRILOSEC) 20 MG capsule Take 1 capsule (20 mg total) by mouth daily.   tamsulosin (FLOMAX) 0.4 MG CAPS capsule Take 1 tablet by mouth daily until you pass the kidney stone or no longer have symptoms   meloxicam (MOBIC) 15 MG tablet Take 1 tablet (15 mg total) by mouth daily. (Patient not taking: Reported on 11/17/2020)   STELARA 45 MG/0.5ML SOLN  (Patient not taking: Reported on 11/17/2020)   temazepam (RESTORIL) 15 MG capsule Take 1 capsule (15 mg total) by mouth at bedtime as needed for sleep. (Patient not taking: Reported on 11/17/2020)   [DISCONTINUED] docusate sodium (COLACE) 100 MG capsule Take 1 tablet once or twice daily as needed for constipation while taking narcotic pain medicine (Patient not taking: Reported on 11/17/2020)   No facility-administered medications prior to visit.    Review of Systems  Constitutional: Negative  for appetite change, chills, fatigue and fever.  Respiratory: Negative for chest tightness and shortness of breath.   Cardiovascular: Negative for chest pain and palpitations.  Gastrointestinal: Negative for abdominal pain, nausea and vomiting.  Musculoskeletal: Positive for arthralgias, gait problem (stiff) and myalgias.  Neurological:  Negative for dizziness, tingling, weakness and numbness.    Last CBC Lab Results  Component Value Date   WBC 6.9 03/27/2020   HGB 12.1 03/27/2020   HCT 36.3 03/27/2020   MCV 79.6 (L) 03/27/2020   MCH 26.5 03/27/2020   RDW 14.3 03/27/2020   PLT 311 26/94/8546   Last metabolic panel Lab Results  Component Value Date   GLUCOSE 107 (H) 03/27/2020   NA 139 03/27/2020   K 3.5 03/27/2020   CL 106 03/27/2020   CO2 22 03/27/2020   BUN 24 (H) 03/27/2020   CREATININE 1.15 (H) 03/27/2020   GFRNONAA 50 (L) 03/27/2020   GFRAA 58 (L) 03/27/2020   CALCIUM 8.8 (L) 03/27/2020   PROT 7.7 05/05/2018   ALBUMIN 3.4 (L) 05/05/2018   BILITOT 0.4 05/05/2018   ALKPHOS 74 05/05/2018   AST 13 (L) 05/05/2018   ALT 9 05/05/2018   ANIONGAP 11 03/27/2020   Last lipids No results found for: CHOL, HDL, LDLCALC, LDLDIRECT, TRIG, CHOLHDL    Objective    There were no vitals taken for this visit. BP Readings from Last 3 Encounters:  04/08/20 107/74  04/05/20 113/74  03/31/20 127/66   Wt Readings from Last 3 Encounters:  04/08/20 163 lb 12.8 oz (74.3 kg)  04/05/20 163 lb 3.2 oz (74 kg)  03/30/20 163 lb 9.6 oz (74.2 kg)      Physical Exam Vitals reviewed.  Constitutional:      General: She is not in acute distress.    Appearance: Normal appearance. She is well-developed and well-nourished. She is not ill-appearing.  HENT:     Head: Normocephalic and atraumatic.  Eyes:     Extraocular Movements: EOM normal.  Pulmonary:     Effort: Pulmonary effort is normal. No respiratory distress.  Musculoskeletal:     Cervical back: Normal range of motion and neck supple.  Neurological:     Mental Status: She is alert.  Psychiatric:        Mood and Affect: Mood and affect and mood normal.        Behavior: Behavior normal.        Thought Content: Thought content normal.        Judgment: Judgment normal.       Assessment & Plan     1. Trochanteric bursitis of both hips Patient has had pain  and stiffness in the right hip for over 3 months. Now having pain in the left hip. Patient is unable to lie on either hip while sleeping. Suspect trochanteric bursitis. Possible underlying mild OA. Imaging has been obtained of the right. Will get of the left. Referral placed as below to Dr. Harlow Mares, [facility]. Consult appreciated.  - DG Hip Unilat W OR W/O Pelvis 2-3 Views Left; Future - Ambulatory referral to Orthopedic Surgery  2. Primary osteoarthritis of both hips See above medical treatment plan. - DG Hip Unilat W OR W/O Pelvis 2-3 Views Left; Future - Ambulatory referral to Orthopedic Surgery   No follow-ups on file.     I discussed the assessment and treatment plan with the patient. The patient was provided an opportunity to ask questions and all were answered. The patient agreed with the plan and demonstrated  an understanding of the instructions.   The patient was advised to call back or seek an in-person evaluation if the symptoms worsen or if the condition fails to improve as anticipated.  I provided 14 minutes of face-to-face time during this encounter via MyChart Video enabled encounter.  Reynolds Bowl, PA-C, have reviewed all documentation for this visit. The documentation on 11/22/20 for the exam, diagnosis, procedures, and orders are all accurate and complete.  [place] [facility] [phone] (phone) [phone] (fax)  [place]

## 2020-11-22 ENCOUNTER — Encounter: Payer: Self-pay | Admitting: Physician Assistant

## 2020-11-22 NOTE — Patient Instructions (Signed)
Hip Bursitis  Hip bursitis is swelling of one or more fluid-filled sacs (bursae) in your hip joint. This condition can cause pain, and your symptoms may come and go over time. What are the causes?  Repeated use of your hip muscles.  Injury to the hip.  Weak butt muscles.  Bone spurs.  Infection. In some cases, the cause may not be known. What increases the risk? You are more likely to develop this condition if:  You had a past hip injury or hip surgery.  You have a condition, such as arthritis, gout, diabetes, or thyroid disease.  You have spine problems.  You have one leg that is shorter than the other.  You run a lot or do long-distance running.  You play sports where there is a risk of injury or falling, such as football, martial arts, or skiing. What are the signs or symptoms? Symptoms may come and go, and they often include:  Pain in the hip or groin area. Pain may get worse when you move your hip.  Tenderness and swelling of the hip. In rare cases, the bursa may become infected. If this happens, you may get a fever, as well as have warmth and redness in the hip area. How is this treated? This condition is treated by:  Resting your hip.  Icing your hip.  Wrapping the hip area with an elastic bandage (compression wrap).  Keeping the hip raised. Other treatments may include medicine, draining fluid out of the bursa, or using crutches, a cane, or a walker. Surgery may be needed, but this is rare. Long-term treatment may include doing exercises to help your strength and flexibility. It may also include lifestyle changes like losing weight to lessen the strain on your hip. Follow these instructions at home: Managing pain, stiffness, and swelling  If told, put ice on the painful area. ? Put ice in a plastic bag. ? Place a towel between your skin and the bag. ? Leave the ice on for 20 minutes, 2-3 times a day.  Raise your hip by putting a pillow under your hips  while you lie down. Stop if you feel pain.  If told, put heat on the affected area. Do this as often as told by your doctor. Use a moist heat pack or a heating pad as told by your doctor. ? Place a towel between your skin and the heat source. ? Leave the heat on for 20-30 minutes. ? Take off the heat if your skin turns bright red. This is very important if you are unable to feel pain, heat, or cold. You may have a greater risk of getting burned.      Activity  Do not use your hip to support your body weight until your doctor says that you can.  Use crutches, a cane, or a walker as told by your doctor.  If the affected leg is one that you use to drive, ask your doctor if it is safe to drive.  Rest and protect your hip as much as you can until you feel better.  Return to your normal activities as told by your doctor. Ask your doctor what activities are safe for you.  Do exercises as told by your doctor. General instructions  Take over-the-counter and prescription medicines only as told by your doctor.  Gently rub and stretch your injured area as often as is comfortable.  Wear elastic bandages only as told by your doctor.  If one of your legs is  shorter than the other, get fitted for a shoe insert or orthotic.  Keep a healthy weight. Follow instructions from your doctor.  Keep all follow-up visits as told by your doctor. This is important. How is this prevented?  Exercise regularly, as told by your doctor.  Wear the right shoes for the sport you play.  Warm up and stretch before being active. Cool down and stretch after being active.  Take breaks often from repeated activity.  Avoid activities that bother your hip or cause pain.  Avoid sitting down for a long time. Where to find more information  American Academy of Orthopaedic Surgeons: orthoinfo.aaos.org Contact a doctor if:  You have a fever.  You have new symptoms.  You have trouble walking or doing everyday  activities.  You have pain that gets worse or does not get better with medicine.  Your skin around your hip is red.  You get a feeling of warmth in your hip area. Get help right away if:  You cannot move your hip.  You have very bad pain.  You cannot control the muscles in your feet. Summary  Hip bursitis is swelling of one or more fluid-filled sacs (bursae) in your hip joint.  Symptoms often come and go over time.  This condition is often treated by resting and icing the hip. It also may help to keep the area raised and wrapped in an elastic bandage. Other treatments may be needed. This information is not intended to replace advice given to you by your health care provider. Make sure you discuss any questions you have with your health care provider. Document Revised: 07/13/2019 Document Reviewed: 05/19/2018 Elsevier Patient Education  2021 [place].   Hip Bursitis Rehab Ask your health care provider which exercises are safe for you. Do exercises exactly as told by your health care provider and adjust them as directed. It is normal to feel mild stretching, pulling, tightness, or discomfort as you do these exercises. Stop right away if you feel sudden pain or your pain gets worse. Do not begin these exercises until told by your health care provider. Stretching exercise This exercise warms up your muscles and joints and improves the movement and flexibility of your hip. This exercise also helps to relieve pain and stiffness. Iliotibial band stretch An iliotibial band is a strong band of muscle tissue that runs from the outer side of your hip to the outer side of your thigh and knee. 1. Lie on your side with your left / right leg in the top position. 2. Bend your left / right knee and grab your ankle. Stretch out your bottom arm to help you balance. 3. Slowly bring your knee back so your thigh is behind your body. 4. Slowly lower your knee toward the floor until you feel a gentle  stretch on the outside of your left / right thigh. If you do not feel a stretch and your knee will not fall farther, place the heel of your other foot on top of your knee and pull your knee down toward the floor with your foot. 5. Hold this position for __________ seconds. 6. Slowly return to the starting position. Repeat __________ times. Complete this exercise __________ times a day.   Strengthening exercises These exercises build strength and endurance in your hip and pelvis. Endurance is the ability to use your muscles for a long time, even after they get tired. Bridge This exercise strengthens the muscles that move your thigh backward (hip extensors).  1. Lie on your back on a firm surface with your knees bent and your feet flat on the floor. 2. Tighten your buttocks muscles and lift your buttocks off the floor until your trunk is level with your thighs. ? Do not arch your back. ? You should feel the muscles working in your buttocks and the back of your thighs. If you do not feel these muscles, slide your feet 1-2 inches (2.5-5 cm) farther away from your buttocks. ? If this exercise is too easy, try doing it with your arms crossed over your chest. 3. Hold this position for __________ seconds. 4. Slowly lower your hips to the starting position. 5. Let your muscles relax completely after each repetition. Repeat __________ times. Complete this exercise __________ times a day.   Squats This exercise strengthens the muscles in front of your thigh and knee (quadriceps). 1. Stand in front of a table, with your feet and knees pointing straight ahead. You may rest your hands on the table for balance but not for support. 2. Slowly bend your knees and lower your hips like you are going to sit in a chair. ? Keep your weight over your heels, not over your toes. ? Keep your lower legs upright so they are parallel with the table legs. ? Do not let your hips go lower than your knees. ? Do not bend lower  than told by your health care provider. ? If your hip pain increases, do not bend as low. 3. Hold the squat position for __________ seconds. 4. Slowly push with your legs to return to standing. Do not use your hands to pull yourself to standing. Repeat __________ times. Complete this exercise __________ times a day. Hip hike 1. Stand sideways on a bottom step. Stand on your left / right leg with your other foot unsupported next to the step. You can hold on to the railing or wall for balance if needed. 2. Keep your knees straight and your torso square. Then lift your left / right hip up toward the ceiling. 3. Hold this position for __________ seconds. 4. Slowly let your left / right hip lower toward the floor, past the starting position. Your foot should get closer to the floor. Do not lean or bend your knees. Repeat __________ times. Complete this exercise __________ times a day. Single leg stand 1. Without shoes, stand near a railing or in a doorway. You may hold on to the railing or door frame as needed for balance. 2. Squeeze your left / right buttock muscles, then lift up your other foot. ? Do not let your left / right hip push out to the side. ? It is helpful to stand in front of a mirror for this exercise so you can watch your hip. 3. Hold this position for __________ seconds. Repeat __________ times. Complete this exercise __________ times a day. This information is not intended to replace advice given to you by your health care provider. Make sure you discuss any questions you have with your health care provider. Document Revised: 01/05/2019 Document Reviewed: 01/05/2019 Elsevier Patient Education  [place].

## 2020-12-12 DIAGNOSIS — G8929 Other chronic pain: Secondary | ICD-10-CM | POA: Diagnosis not present

## 2020-12-12 DIAGNOSIS — M545 Low back pain, unspecified: Secondary | ICD-10-CM | POA: Diagnosis not present

## 2020-12-12 DIAGNOSIS — M7061 Trochanteric bursitis, right hip: Secondary | ICD-10-CM | POA: Diagnosis not present

## 2020-12-27 DIAGNOSIS — M5030 Other cervical disc degeneration, unspecified cervical region: Secondary | ICD-10-CM | POA: Diagnosis not present

## 2020-12-27 DIAGNOSIS — M542 Cervicalgia: Secondary | ICD-10-CM | POA: Diagnosis not present

## 2020-12-29 DIAGNOSIS — K5190 Ulcerative colitis, unspecified, without complications: Secondary | ICD-10-CM | POA: Diagnosis not present

## 2021-02-22 DIAGNOSIS — U071 COVID-19: Secondary | ICD-10-CM | POA: Diagnosis not present

## 2021-03-05 ENCOUNTER — Other Ambulatory Visit: Payer: Self-pay | Admitting: Physician Assistant

## 2021-03-09 ENCOUNTER — Other Ambulatory Visit: Payer: Self-pay | Admitting: Physician Assistant

## 2021-03-09 NOTE — Telephone Encounter (Unsigned)
Copied from [place] [id number]. Topic: Quick Communication - Rx Refill/Question >> Mar 09, 2021  9:30 AM Yvette Rack wrote: Medication: citalopram (CELEXA) 40 MG tablet  Has the patient contacted their pharmacy? Yes.   (Agent: If no, request that the patient contact the pharmacy for the refill.) (Agent: If yes, when and what did the pharmacy advise?)  Preferred Pharmacy (with phone number or street name): [facility] [place], [place] [address]  Phone:  [phone] Fax:  [phone]  Agent: Please be advised that RX refills may take up to 3 business days. We ask that you follow-up with your pharmacy.

## 2021-03-23 ENCOUNTER — Encounter: Payer: Self-pay | Admitting: Family Medicine

## 2021-03-23 ENCOUNTER — Other Ambulatory Visit: Payer: Self-pay

## 2021-03-23 ENCOUNTER — Ambulatory Visit (INDEPENDENT_AMBULATORY_CARE_PROVIDER_SITE_OTHER): Payer: Medicare Other | Admitting: Family Medicine

## 2021-03-23 VITALS — BP 110/80 | HR 66 | Temp 97.5°F | Resp 16 | Wt 164.0 lb

## 2021-03-23 DIAGNOSIS — K5100 Ulcerative (chronic) pancolitis without complications: Secondary | ICD-10-CM

## 2021-03-23 DIAGNOSIS — F330 Major depressive disorder, recurrent, mild: Secondary | ICD-10-CM | POA: Diagnosis not present

## 2021-03-23 DIAGNOSIS — R0609 Other forms of dyspnea: Secondary | ICD-10-CM | POA: Insufficient documentation

## 2021-03-23 DIAGNOSIS — R7301 Impaired fasting glucose: Secondary | ICD-10-CM

## 2021-03-23 DIAGNOSIS — E663 Overweight: Secondary | ICD-10-CM | POA: Insufficient documentation

## 2021-03-23 DIAGNOSIS — Z1159 Encounter for screening for other viral diseases: Secondary | ICD-10-CM | POA: Diagnosis not present

## 2021-03-23 DIAGNOSIS — Z1231 Encounter for screening mammogram for malignant neoplasm of breast: Secondary | ICD-10-CM | POA: Diagnosis not present

## 2021-03-23 DIAGNOSIS — Z79899 Other long term (current) drug therapy: Secondary | ICD-10-CM | POA: Diagnosis not present

## 2021-03-23 DIAGNOSIS — R002 Palpitations: Secondary | ICD-10-CM | POA: Diagnosis not present

## 2021-03-23 DIAGNOSIS — R0600 Dyspnea, unspecified: Secondary | ICD-10-CM | POA: Diagnosis not present

## 2021-03-23 MED ORDER — CITALOPRAM HYDROBROMIDE 40 MG PO TABS: 40.0000 mg | ORAL_TABLET | Freq: Every day | ORAL | 1 refills | Status: DC

## 2021-03-23 NOTE — Assessment & Plan Note (Signed)
Followed by [facility] [place]

## 2021-03-23 NOTE — Patient Instructions (Addendum)
The CDC recommends two doses of Shingrix (the shingles vaccine) separated by 2 to 6 months for adults age 67 years and older. I recommend checking with your insurance plan regarding coverage for this vaccine.   Palpitations Palpitations are feelings that your heartbeat is not normal. Your heartbeat may feel like it is: Uneven. Faster than normal. Fluttering. Skipping a beat. This is usually not a serious problem. In some cases, you may need tests torule out any serious problems. Follow these instructions at home: Pay attention to any changes in your condition. Take these actions to helpmanage your symptoms: Eating and drinking Avoid: Coffee, tea, soft drinks, and energy drinks. Chocolate. Alcohol. Diet pills. Lifestyle  Try to lower your stress. These things can help you relax: Yoga. Deep breathing and meditation. Exercise. Using words and images to create positive thoughts (guided imagery). Using your mind to control things in your body (biofeedback). Do not use drugs. Get plenty of rest and sleep. Keep a regular bed time.  General instructions  Take over-the-counter and prescription medicines only as told by your doctor. Do not use any products that contain nicotine or tobacco, such as cigarettes and e-cigarettes. If you need help quitting, ask your doctor. Keep all follow-up visits as told by your doctor. This is important. You may need more tests if palpitations do not go away or get worse.  Contact a doctor if: Your symptoms last more than 24 hours. Your symptoms occur more often. Get help right away if you: Have chest pain. Feel short of breath. Have a very bad headache. Feel dizzy. Pass out (faint). Summary Palpitations are feelings that your heartbeat is uneven or faster than normal. It may feel like your heart is fluttering or skipping a beat. Avoid food and drinks that may cause palpitations. These include caffeine, chocolate, and alcohol. Try to lower your  stress. Do not smoke or use drugs. Get help right away if you faint or have chest pain, shortness of breath, a severe headache, or dizziness. This information is not intended to replace advice given to you by your health care provider. Make sure you discuss any questions you have with your healthcare provider. Document Revised: 10/23/2017 Document Reviewed: 10/23/2017 Elsevier Patient Education  2022 [employer].

## 2021-03-23 NOTE — Progress Notes (Signed)
Established patient visit   Patient: Karla [name]   DOB: 1956-03-31   65 y.o. Female  MRN: [id number] Visit Date: 03/23/2021  Today's healthcare provider: Lavon Paganini, MD   Chief Complaint  Patient presents with   Follow-up   Depression   Subjective    Depression        Associated symptoms include no fatigue, no appetite change, no myalgias and no headaches.   Depression, Follow-up  She  was last seen for this 10 months ago. Changes made at last visit include; on 40 mg citalopram. She reports she had a home visit yesterday. She is requesting for a refill on her citalopram.    She reports fair compliance with treatment. She is not having side effects.   She reports good tolerance of treatment. Current symptoms include: fatigue, feelings of worthlessness/guilt, hopelessness, and insomnia She feels she is Worse since last visit. There are times she forgets to take her medication and her mood changes.   Depression screen [facility] 2/9 03/23/2021 04/08/2020 02/04/2020  Decreased Interest 1 1 0  Down, Depressed, Hopeless 0 1 0  PHQ - 2 Score 1 2 0  Altered sleeping 2 1 0  Tired, decreased energy 2 1 0  Change in appetite 2 1 0  Feeling bad or failure about yourself  1 0 0  Trouble concentrating 0 1 0  Moving slowly or fidgety/restless 0 0 0  Suicidal thoughts 0 0 0  PHQ-9 Score 8 6 0  Difficult doing work/chores Somewhat difficult Somewhat difficult Not difficult at all   Ulcerative Colitis  She receives her 0.5 mL stelara shots every 6 weeks with Dr. [facility] for her ulcerative colitis.    Palpitations She noticed in the last 2 nights she has had palpitations. In the past the palpitations would began when walking up steps. She also reports experiencing SOB. She is concerned because her mother has congestive heart failure and her son was diagnosed at 1 with Wolff-Parkinson White  syndrome. ----------------------------------------------------------------------------     Medications: Outpatient Medications Prior to Visit  Medication Sig   acetaminophen (TYLENOL) 325 MG tablet Take by mouth.   omeprazole (PRILOSEC) 20 MG capsule Take 1 capsule (20 mg total) by mouth daily.   ustekinumab (STELARA) 90 MG/ML SOSY injection 40m subcutaneous every 8weeks   [DISCONTINUED] citalopram (CELEXA) 40 MG tablet Take 1 tablet (40 mg total) by mouth daily.   [DISCONTINUED] melatonin 3 MG TABS tablet Take 1 tablet (3 mg total) by mouth at bedtime. (Patient not taking: Reported on 03/23/2021)   [DISCONTINUED] meloxicam (MOBIC) 15 MG tablet Take 1 tablet (15 mg total) by mouth daily. (Patient not taking: No sig reported)   [DISCONTINUED] STELARA 45 MG/0.5ML SOLN  (Patient not taking: No sig reported)   [DISCONTINUED] tamsulosin (FLOMAX) 0.4 MG CAPS capsule Take 1 tablet by mouth daily until you pass the kidney stone or no longer have symptoms (Patient not taking: Reported on 03/23/2021)   [DISCONTINUED] temazepam (RESTORIL) 15 MG capsule Take 1 capsule (15 mg total) by mouth at bedtime as needed for sleep. (Patient not taking: No sig reported)   No facility-administered medications prior to visit.   Review of Systems  Constitutional:  Negative for appetite change, chills, fatigue and fever.  HENT:  Negative for ear pain, sinus pressure, sinus pain and sore throat.   Eyes:  Negative for pain and visual disturbance.  Respiratory:  Negative for cough, chest tightness, shortness of breath and wheezing.   Cardiovascular:  Negative for chest pain, palpitations and leg swelling.  Gastrointestinal:  Negative for abdominal pain, blood in stool, diarrhea, nausea and vomiting.  Genitourinary:  Negative for flank pain, frequency, pelvic pain and urgency.  Musculoskeletal:  Negative for back pain, myalgias and neck pain.  Neurological:  Negative for dizziness, weakness, light-headedness, numbness  and headaches.  Psychiatric/Behavioral:  Positive for depression.       Objective    BP 110/80 (BP Location: Right Arm, Patient Position: Sitting, Cuff Size: Normal)   Pulse 66   Temp (!) 97.5 F (36.4 C) (Temporal)   Resp 16   Wt 164 lb (74.4 kg)   SpO2 96%   BMI 29.05 kg/m     Physical Exam Vitals reviewed.  Constitutional:      General: She is not in acute distress.    Appearance: Normal appearance. She is well-developed. She is not diaphoretic.  HENT:     Head: Normocephalic and atraumatic.  Eyes:     General: No scleral icterus.    Conjunctiva/sclera: Conjunctivae normal.  Neck:     Thyroid: No thyromegaly.  Cardiovascular:     Rate and Rhythm: Normal rate and regular rhythm.     Pulses: Normal pulses.     Heart sounds: Normal heart sounds. No murmur heard. Pulmonary:     Effort: Pulmonary effort is normal. No respiratory distress.     Breath sounds: Normal breath sounds. No wheezing, rhonchi or rales.  Musculoskeletal:     Cervical back: Neck supple.     Right lower leg: No edema.     Left lower leg: No edema.  Lymphadenopathy:     Cervical: No cervical adenopathy.  Skin:    General: Skin is warm and dry.     Findings: No rash.  Neurological:     Mental Status: She is alert and oriented to person, place, and time. Mental status is at baseline.  Psychiatric:        Mood and Affect: Mood normal.        Behavior: Behavior normal.    Results for orders placed or performed in visit on 03/23/21  CBC w/Diff/Platelet  Result Value Ref Range   WBC 6.0 3.4 - 10.8 x10E3/uL   RBC 4.67 3.77 - 5.28 x10E6/uL   Hemoglobin 12.7 11.1 - 15.9 g/dL   Hematocrit 39.9 34.0 - 46.6 %   MCV 85 79 - 97 fL   MCH 27.2 26.6 - 33.0 pg   MCHC 31.8 31.5 - 35.7 g/dL   RDW 13.8 11.7 - 15.4 %   Platelets 304 150 - 450 x10E3/uL   Neutrophils 60 Not Estab. %   Lymphs 28 Not Estab. %   Monocytes 9 Not Estab. %   Eos 2 Not Estab. %   Basos 1 Not Estab. %   Neutrophils Absolute 3.6  1.4 - 7.0 x10E3/uL   Lymphocytes Absolute 1.7 0.7 - 3.1 x10E3/uL   Monocytes Absolute 0.5 0.1 - 0.9 x10E3/uL   EOS (ABSOLUTE) 0.1 0.0 - 0.4 x10E3/uL   Basophils Absolute 0.1 0.0 - 0.2 x10E3/uL   Immature Granulocytes 0 Not Estab. %   Immature Grans (Abs) 0.0 0.0 - 0.1 x10E3/uL  Lipid panel  Result Value Ref Range   Cholesterol, Total 219 (H) 100 - 199 mg/dL   Triglycerides 297 (H) 0 - 149 mg/dL   HDL 34 (L) >39 mg/dL   VLDL Cholesterol Cal 53 (H) 5 - 40 mg/dL   LDL Chol Calc (NIH) 132 (H) 0 - 99 mg/dL  Chol/HDL Ratio 6.4 (H) 0.0 - 4.4 ratio  Comprehensive metabolic panel  Result Value Ref Range   Glucose 96 65 - 99 mg/dL   BUN 23 8 - 27 mg/dL   Creatinine, Ser 0.96 0.57 - 1.00 mg/dL   eGFR 65 >59 mL/min/1.73   BUN/Creatinine Ratio 24 12 - 28   Sodium 140 134 - 144 mmol/L   Potassium 4.3 3.5 - 5.2 mmol/L   Chloride 102 96 - 106 mmol/L   CO2 22 20 - 29 mmol/L   Calcium 9.6 8.7 - 10.3 mg/dL   Total Protein 7.2 6.0 - 8.5 g/dL   Albumin 4.2 3.8 - 4.8 g/dL   Globulin, Total 3.0 1.5 - 4.5 g/dL   Albumin/Globulin Ratio 1.4 1.2 - 2.2   Bilirubin Total 0.3 0.0 - 1.2 mg/dL   Alkaline Phosphatase 92 44 - 121 IU/L   AST 17 0 - 40 IU/L   ALT 11 0 - 32 IU/L  Hepatitis C Antibody  Result Value Ref Range   Hep C Virus Ab <0.1 0.0 - 0.9 s/co ratio  Hemoglobin A1c  Result Value Ref Range   Hgb A1c MFr Bld 6.3 (H) 4.8 - 5.6 %   Est. average glucose Bld gHb Est-mCnc 134 mg/dL  TSH  Result Value Ref Range   TSH 2.420 0.450 - 4.500 uIU/mL    Assessment & Plan     Problem List Items Addressed This Visit       Digestive   Chronic universal ulcerative colitis (HCC)    Followed by [facility] On Stelara       Relevant Orders   CBC w/Diff/Platelet (Completed)   Comprehensive metabolic panel (Completed)     Endocrine   IFG (impaired fasting glucose)    Recommend low carb diet Recheck A1c        Relevant Orders   Hemoglobin A1c     Other   Depression - Primary    Chronic and  fairly well controlled Continue SSRI at current dose Encourage therapy Denies SI/HI       Relevant Medications   citalopram (CELEXA) 40 MG tablet   Overweight    Discussed importance of healthy weight management Discussed diet and exercise        Relevant Orders   CBC w/Diff/Platelet (Completed)   Lipid panel (Completed)   Comprehensive metabolic panel (Completed)   Palpitations    New problem Somewhat longstanding Fairly frequent palpitations Has significant family history for heart disease and arrhythmias Zio patch applied today x14 days Treatment and further evaluation pending results       Relevant Orders   TSH   LONG TERM MONITOR (3-14 DAYS)   EKG 12-Lead (Completed)   DOE (dyspnea on exertion)    New problem with worsening Unable to climb her stairs without shortness of breath Discussed possibility of this being an anginal equivalent She denies any chest pain Referral to cardiology for restratification with cardiac CTA or stress test as indicated       Relevant Orders   Ambulatory referral to Cardiology   EKG 12-Lead (Completed)   Other Visit Diagnoses     Need for hepatitis C screening test       Relevant Orders   Hepatitis C Antibody (Completed)   Encounter for screening mammogram for malignant neoplasm of breast       Relevant Orders   MM 3D SCREEN BREAST BILATERAL   Long-term use of high-risk medication       Relevant Orders  CBC w/Diff/Platelet (Completed)   Comprehensive metabolic panel (Completed)       Return in about 6 months (around 09/22/2021) for AWV, CPE, call back in 1 month to schedule with new PCP.      Total time spent on today's visit was greater than 40 minutes, including both face-to-face time and nonface-to-face time personally spent on review of chart (labs and imaging), discussing labs and goals, discussing further work-up, treatment options, referrals to specialist if needed, reviewing outside records of pertinent,  answering patient's questions, and coordinating care.    I,Essence Turner,acting as a Education administrator for Lavon Paganini, MD.,have documented all relevant documentation on the behalf of Lavon Paganini, MD,as directed by  Lavon Paganini, MD while in the presence of Lavon Paganini, MD.  I, Lavon Paganini, MD, have reviewed all documentation for this visit. The documentation on 03/24/21 for the exam, diagnosis, procedures, and orders are all accurate and complete.   Faryn Sieg, [name], MD, MPH [facility]

## 2021-03-24 LAB — CBC WITH DIFFERENTIAL/PLATELET
Basophils Absolute: 0.1 10*3/uL (ref 0.0–0.2)
Basos: 1 %
EOS (ABSOLUTE): 0.1 10*3/uL (ref 0.0–0.4)
Eos: 2 %
Hematocrit: 39.9 % (ref 34.0–46.6)
Hemoglobin: 12.7 g/dL (ref 11.1–15.9)
Immature Grans (Abs): 0 10*3/uL (ref 0.0–0.1)
Immature Granulocytes: 0 %
Lymphocytes Absolute: 1.7 10*3/uL (ref 0.7–3.1)
Lymphs: 28 %
MCH: 27.2 pg (ref 26.6–33.0)
MCHC: 31.8 g/dL (ref 31.5–35.7)
MCV: 85 fL (ref 79–97)
Monocytes Absolute: 0.5 10*3/uL (ref 0.1–0.9)
Monocytes: 9 %
Neutrophils Absolute: 3.6 10*3/uL (ref 1.4–7.0)
Neutrophils: 60 %
Platelets: 304 10*3/uL (ref 150–450)
RBC: 4.67 x10E6/uL (ref 3.77–5.28)
RDW: 13.8 % (ref 11.7–15.4)
WBC: 6 10*3/uL (ref 3.4–10.8)

## 2021-03-24 LAB — COMPREHENSIVE METABOLIC PANEL
ALT: 11 IU/L (ref 0–32)
AST: 17 IU/L (ref 0–40)
Albumin/Globulin Ratio: 1.4 (ref 1.2–2.2)
Albumin: 4.2 g/dL (ref 3.8–4.8)
Alkaline Phosphatase: 92 IU/L (ref 44–121)
BUN/Creatinine Ratio: 24 (ref 12–28)
BUN: 23 mg/dL (ref 8–27)
Bilirubin Total: 0.3 mg/dL (ref 0.0–1.2)
CO2: 22 mmol/L (ref 20–29)
Calcium: 9.6 mg/dL (ref 8.7–10.3)
Chloride: 102 mmol/L (ref 96–106)
Creatinine, Ser: 0.96 mg/dL (ref 0.57–1.00)
Globulin, Total: 3 g/dL (ref 1.5–4.5)
Glucose: 96 mg/dL (ref 65–99)
Potassium: 4.3 mmol/L (ref 3.5–5.2)
Sodium: 140 mmol/L (ref 134–144)
Total Protein: 7.2 g/dL (ref 6.0–8.5)
eGFR: 65 mL/min/{1.73_m2} (ref >59)

## 2021-03-24 LAB — HEMOGLOBIN A1C
Est. average glucose Bld gHb Est-mCnc: 134 mg/dL
Hgb A1c MFr Bld: 6.3 % — ABNORMAL HIGH (ref 4.8–5.6)

## 2021-03-24 LAB — LIPID PANEL
Chol/HDL Ratio: 6.4 ratio — ABNORMAL HIGH (ref 0.0–4.4)
Cholesterol, Total: 219 mg/dL — ABNORMAL HIGH (ref 100–199)
HDL: 34 mg/dL — ABNORMAL LOW (ref >39)
LDL Chol Calc (NIH): 132 mg/dL — ABNORMAL HIGH (ref 0–99)
Triglycerides: 297 mg/dL — ABNORMAL HIGH (ref 0–149)
VLDL Cholesterol Cal: 53 mg/dL — ABNORMAL HIGH (ref 5–40)

## 2021-03-24 LAB — TSH: TSH: 2.42 u[IU]/mL (ref 0.450–4.500)

## 2021-03-24 LAB — HEPATITIS C ANTIBODY: Hep C Virus Ab: <0.1 s/co ratio (ref 0.0–0.9)

## 2021-03-24 NOTE — Assessment & Plan Note (Signed)
New problem Somewhat longstanding Fairly frequent palpitations Has significant family history for heart disease and arrhythmias Zio patch applied today x14 days Treatment and further evaluation pending results

## 2021-03-24 NOTE — Assessment & Plan Note (Signed)
Chronic and fairly well controlled Continue SSRI at current dose Encourage therapy Denies SI/HI

## 2021-03-24 NOTE — Assessment & Plan Note (Signed)
Recommend low carb diet °Recheck A1c  °

## 2021-03-24 NOTE — Assessment & Plan Note (Signed)
Discussed importance of healthy weight management Discussed diet and exercise  

## 2021-03-24 NOTE — Assessment & Plan Note (Signed)
New problem with worsening Unable to climb her stairs without shortness of breath Discussed possibility of this being an anginal equivalent She denies any chest pain Referral to cardiology for restratification with cardiac CTA or stress test as indicated

## 2021-04-12 DIAGNOSIS — R002 Palpitations: Secondary | ICD-10-CM | POA: Diagnosis not present

## 2021-04-14 ENCOUNTER — Ambulatory Visit: Payer: Medicare Other | Admitting: Internal Medicine

## 2021-04-18 ENCOUNTER — Telehealth (INDEPENDENT_AMBULATORY_CARE_PROVIDER_SITE_OTHER): Payer: Medicare Other | Admitting: Family Medicine

## 2021-04-18 DIAGNOSIS — I471 Supraventricular tachycardia: Secondary | ICD-10-CM | POA: Diagnosis not present

## 2021-04-18 NOTE — Telephone Encounter (Signed)
Zio patch results received for patient.  Indication was palpitations.  She wore it for 13 days and 8 hours from 6/30 to 04/06/2021  Findings are that patient had a minimum heart rate of 44 bpm, max heart rate of 193 bpm, and average heart rate of 63 bpm.  Predominant underlying rhythm was sinus rhythm.  1 run of ventricular tachycardia occurred lasting 4 beats with a max rate of 138 bpm, average 116 bpm.  11 supraventricular tachycardia runs occurred, the run with the fastest interval lasting 6 beats with a max rate of 193 bpm, the longest lasting 12 beats with an average rate of 103 bpm.  Supraventricular tachycardia was detected within 45 seconds of symptomatic patient events.  Isolated SVE's were rare less than 1%, SVE couplets were rare less than 1%, and SVE triplets were rare less than 1%.  Isolated VE's were rare less than 1% and no VE couplets or VE triplets were present.  Overall, this shows supraventricular tachycardia that is intermittent in nature.  This correlates with her symptomatic palpitations.  If she is still symptomatic, we can consider a low-dose of metoprolol to keep her heart rate low and minimize this.  She should also avoid caffeine, smoking, alcohol and get regular exercise and sleep.  Jonnette Nuon, [name], MD, MPH [facility]

## 2021-04-19 NOTE — Telephone Encounter (Signed)
Patient has been advised. KW 

## 2021-04-25 ENCOUNTER — Ambulatory Visit
Admission: RE | Admit: 2021-04-25 | Discharge: 2021-04-25 | Disposition: A | Discharge status: Home / Self Care | Payer: Medicare Other | Source: Ambulatory Visit | Attending: Family Medicine | Admitting: Family Medicine

## 2021-04-25 ENCOUNTER — Other Ambulatory Visit: Payer: Self-pay

## 2021-04-25 DIAGNOSIS — Z1231 Encounter for screening mammogram for malignant neoplasm of breast: Secondary | ICD-10-CM | POA: Insufficient documentation

## 2021-04-27 ENCOUNTER — Inpatient Hospital Stay
Admission: RE | Admit: 2021-04-27 | Discharge: 2021-04-27 | Disposition: A | Discharge status: Home / Self Care | Payer: Self-pay | Source: Ambulatory Visit | Attending: *Deleted | Admitting: *Deleted

## 2021-04-27 ENCOUNTER — Other Ambulatory Visit: Payer: Self-pay | Admitting: *Deleted

## 2021-04-27 DIAGNOSIS — Z1231 Encounter for screening mammogram for malignant neoplasm of breast: Secondary | ICD-10-CM

## 2021-05-11 ENCOUNTER — Ambulatory Visit: Payer: Medicare Other | Admitting: Family Medicine

## 2021-06-11 IMAGING — CT CT RENAL STONE PROTOCOL
2 of 4 series · 15 of 46 positions shown, 17 images · non-contrast
Comparison: CT abdomen pelvis dated 02/25/2008 and abdominal
ultrasound dated 05/05/2018.

CLINICAL DATA: [age] year female with left flank pain. Concern
for kidney stone.

EXAM:
CT ABDOMEN AND PELVIS WITHOUT CONTRAST
TECHNIQUE: Multidetector CT imaging of the abdomen and pelvis was performed
following the standard protocol without IV contrast.

[Series 2: stone full standard · axial · 0.78mm/px · z∈[-866,-446]mm · 12 of 94 slices shown, 14 images]
[im 5/94  soft-tissue]
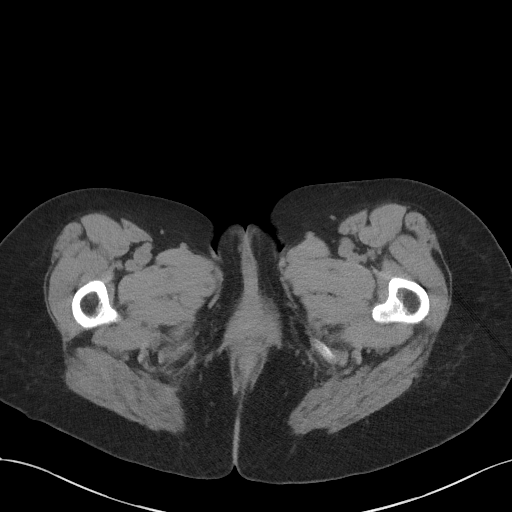
[im 5/94  bone]
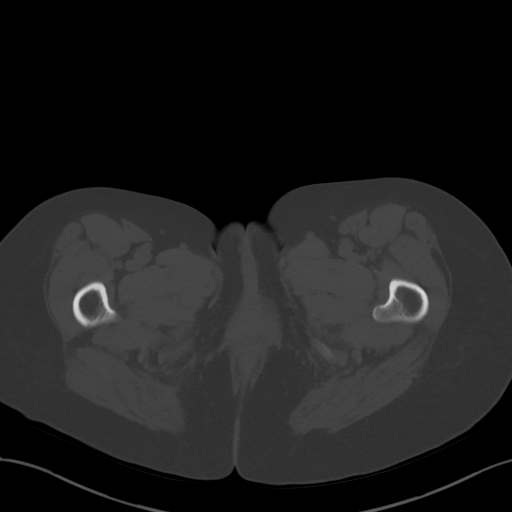
[im 13/94  soft-tissue]
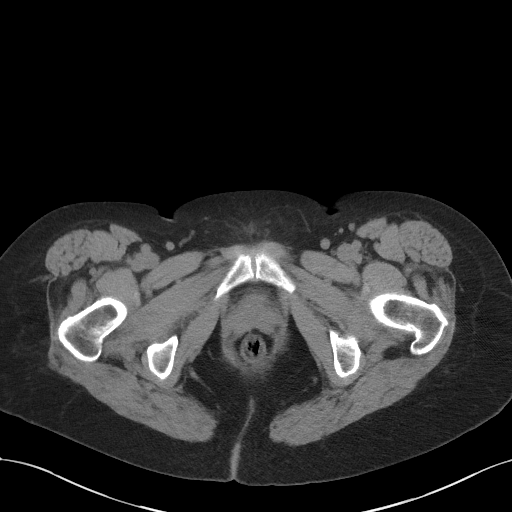
[im 21/94  soft-tissue]
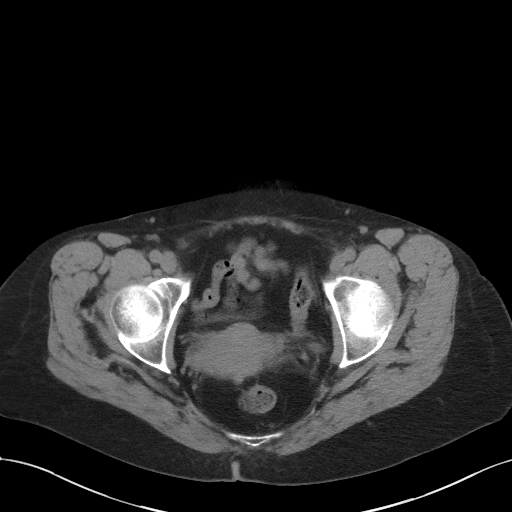
[im 29/94  soft-tissue]
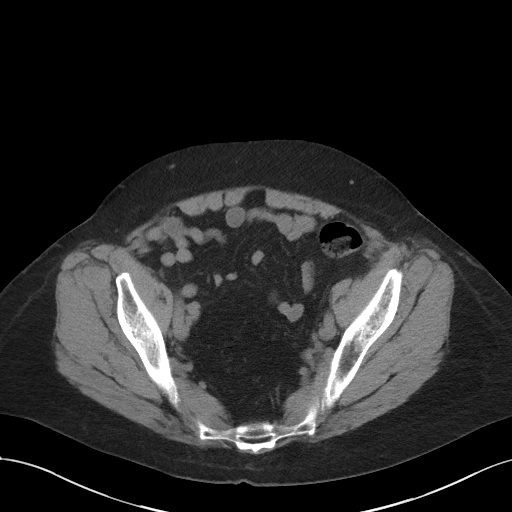
[im 37/94  soft-tissue]
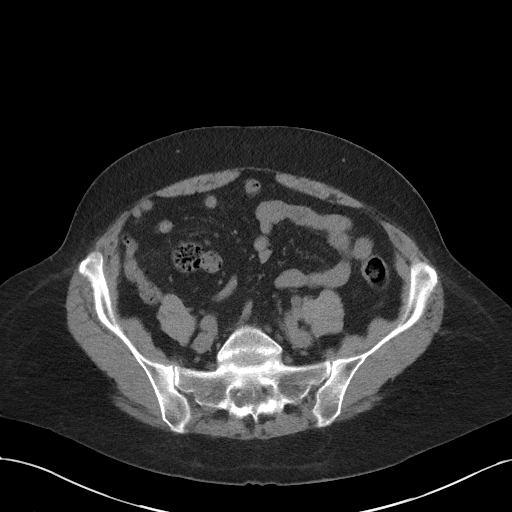
[im 45/94  soft-tissue]
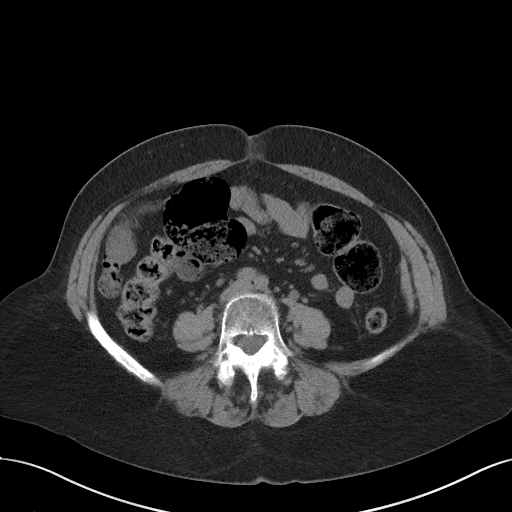
[im 49/94  soft-tissue]
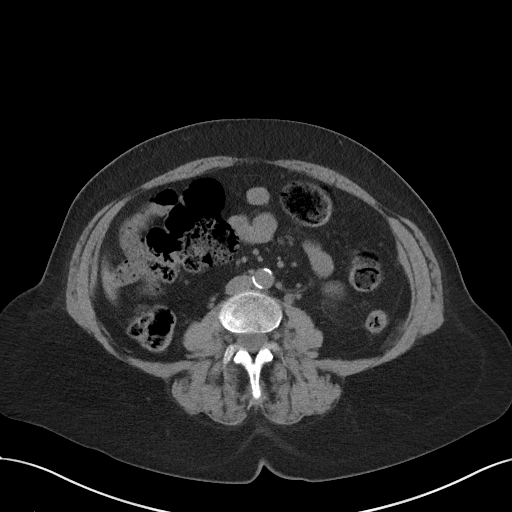
[im 57/94  soft-tissue]
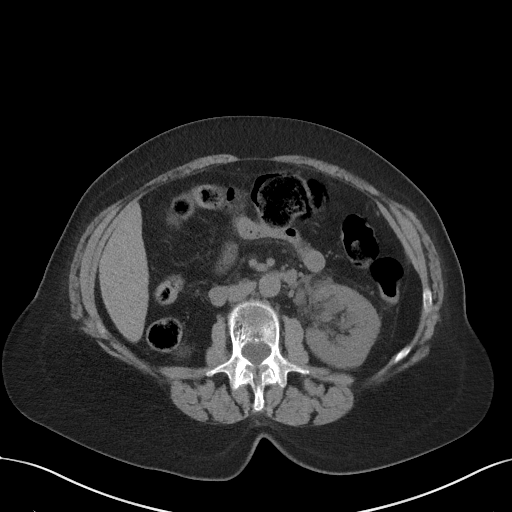
[im 65/94  soft-tissue]
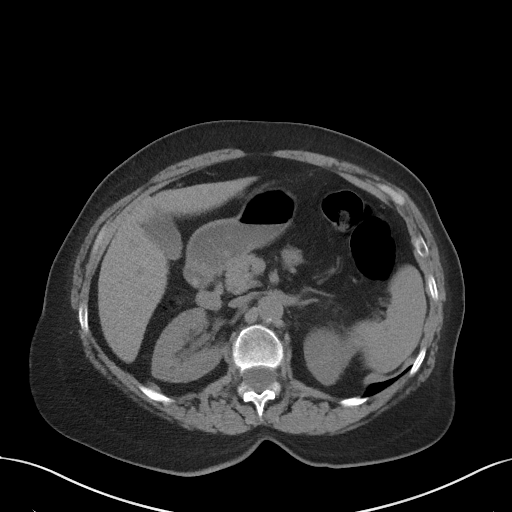
[im 65/94  bone]
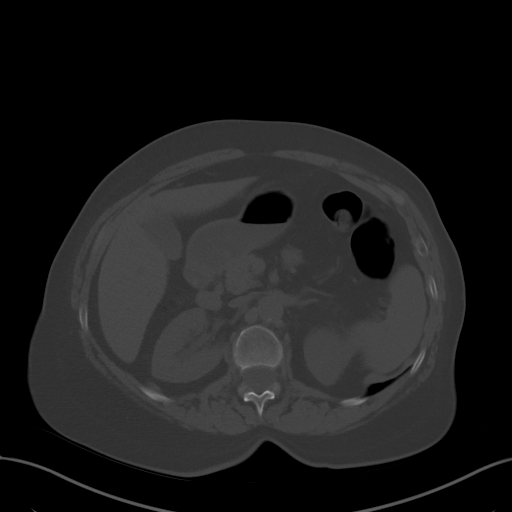
[im 73/94  soft-tissue]
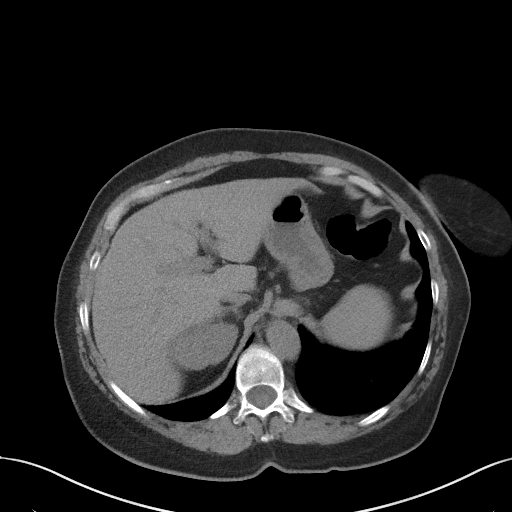
[im 81/94  soft-tissue]
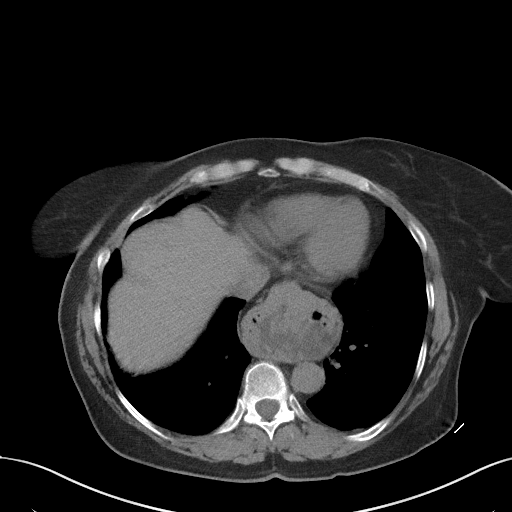
[im 89/94  soft-tissue]
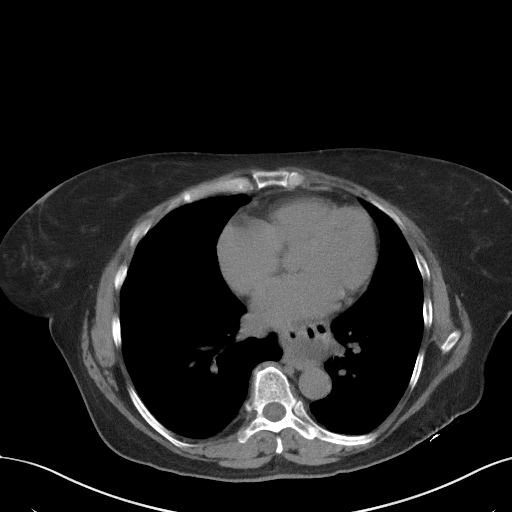

[Series 5: coronal · coronal · 0.68mm/px · 3 of 134 slices shown]
[im 45/134  soft-tissue]
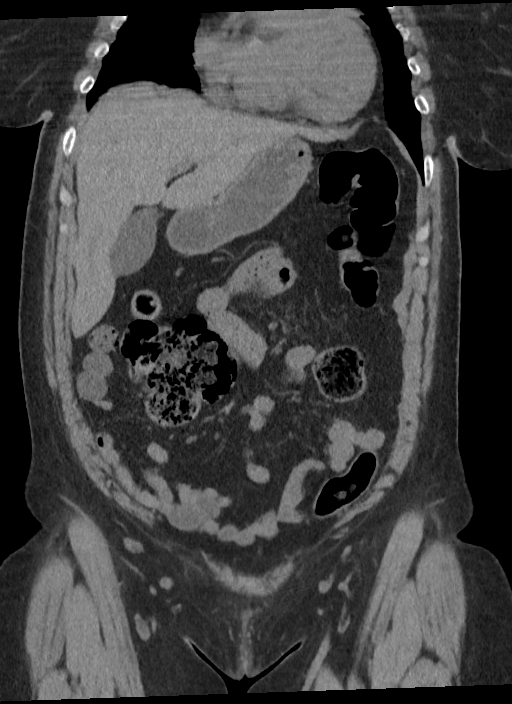
[im 60/134  soft-tissue]
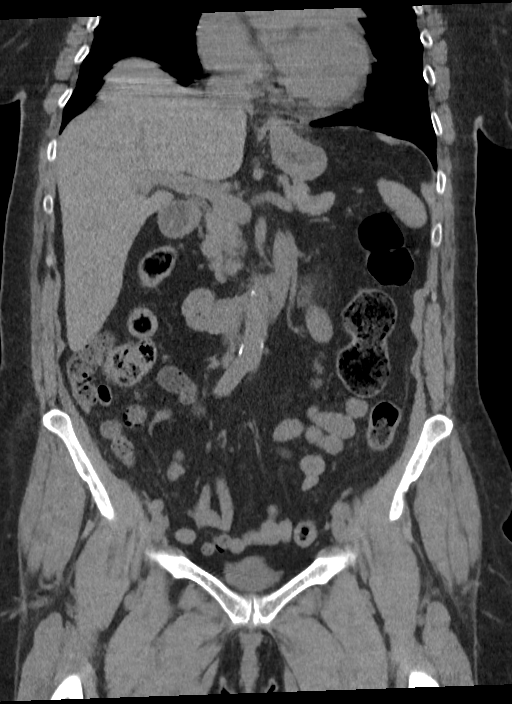
[im 74/134  soft-tissue]
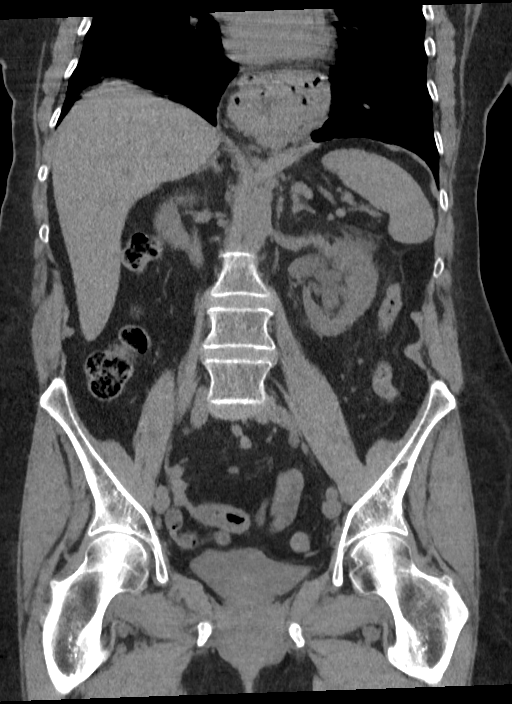

[15 of 46 positions shown; findings below may reference images not displayed]

FINDINGS: Evaluation of this exam is limited in the absence of intravenous
contrast.

Lower chest: The visualized lung bases are clear.

No intra-abdominal free air or free fluid.

Hepatobiliary: No focal liver abnormality is seen. No gallstones,
gallbladder wall thickening, or biliary dilatation.

Pancreas: Unremarkable. No pancreatic ductal dilatation or
surrounding inflammatory changes.

Spleen: Normal in size without focal abnormality.

Adrenals/Urinary Tract: The adrenal glands are unremarkable. There
is a 7 mm stone in the distal left ureter close to the
ureterovesical junction. There is mild left hydro nephro ureter.
There is no hydronephrosis or nephrolithiasis the right. The right
ureter and urinary bladder appear unremarkable.

Stomach/Bowel: There is a moderate size hiatal hernia. There is
moderate stool throughout the colon. No bowel obstruction or active
inflammation. Normal appendix.

Vascular/Lymphatic: Mild aortoiliac atherosclerotic disease. The IVC
is unremarkable. No portal venous gas. There is no adenopathy.

Reproductive: The uterus is retroverted or retroflexed. There is a 2
cm left ovarian cystic structure which is not characterized on this
CT. Further evaluation with ultrasound on a nonemergent/outpatient
basis recommended. Probable small calcified uterine fibroids.

Other: Small fat containing umbilical hernia.

Musculoskeletal: Osteopenia. Lower lumbar facet arthropathy. Grade 1
L4-L5 anterolisthesis. No acute osseous pathology.
IMPRESSION: 1. A 7 mm distal left ureteral stone with mild left hydronephrosis.
2. Moderate size hiatal hernia.
3. A 2 cm left ovarian cystic structure. Further evaluation with
ultrasound on a nonemergent/outpatient basis recommended.
4. Aortic Atherosclerosis ([id number]-INX.X).

## 2021-06-14 IMAGING — CR DG ABDOMEN 1V
2 series · 2 of 2 positions shown · non-contrast
Comparison: June 17, 2013.

CLINICAL DATA: Nephrolithiasis.

EXAM:
ABDOMEN - 1 VIEW

[abdomen kub (1 of 2)]
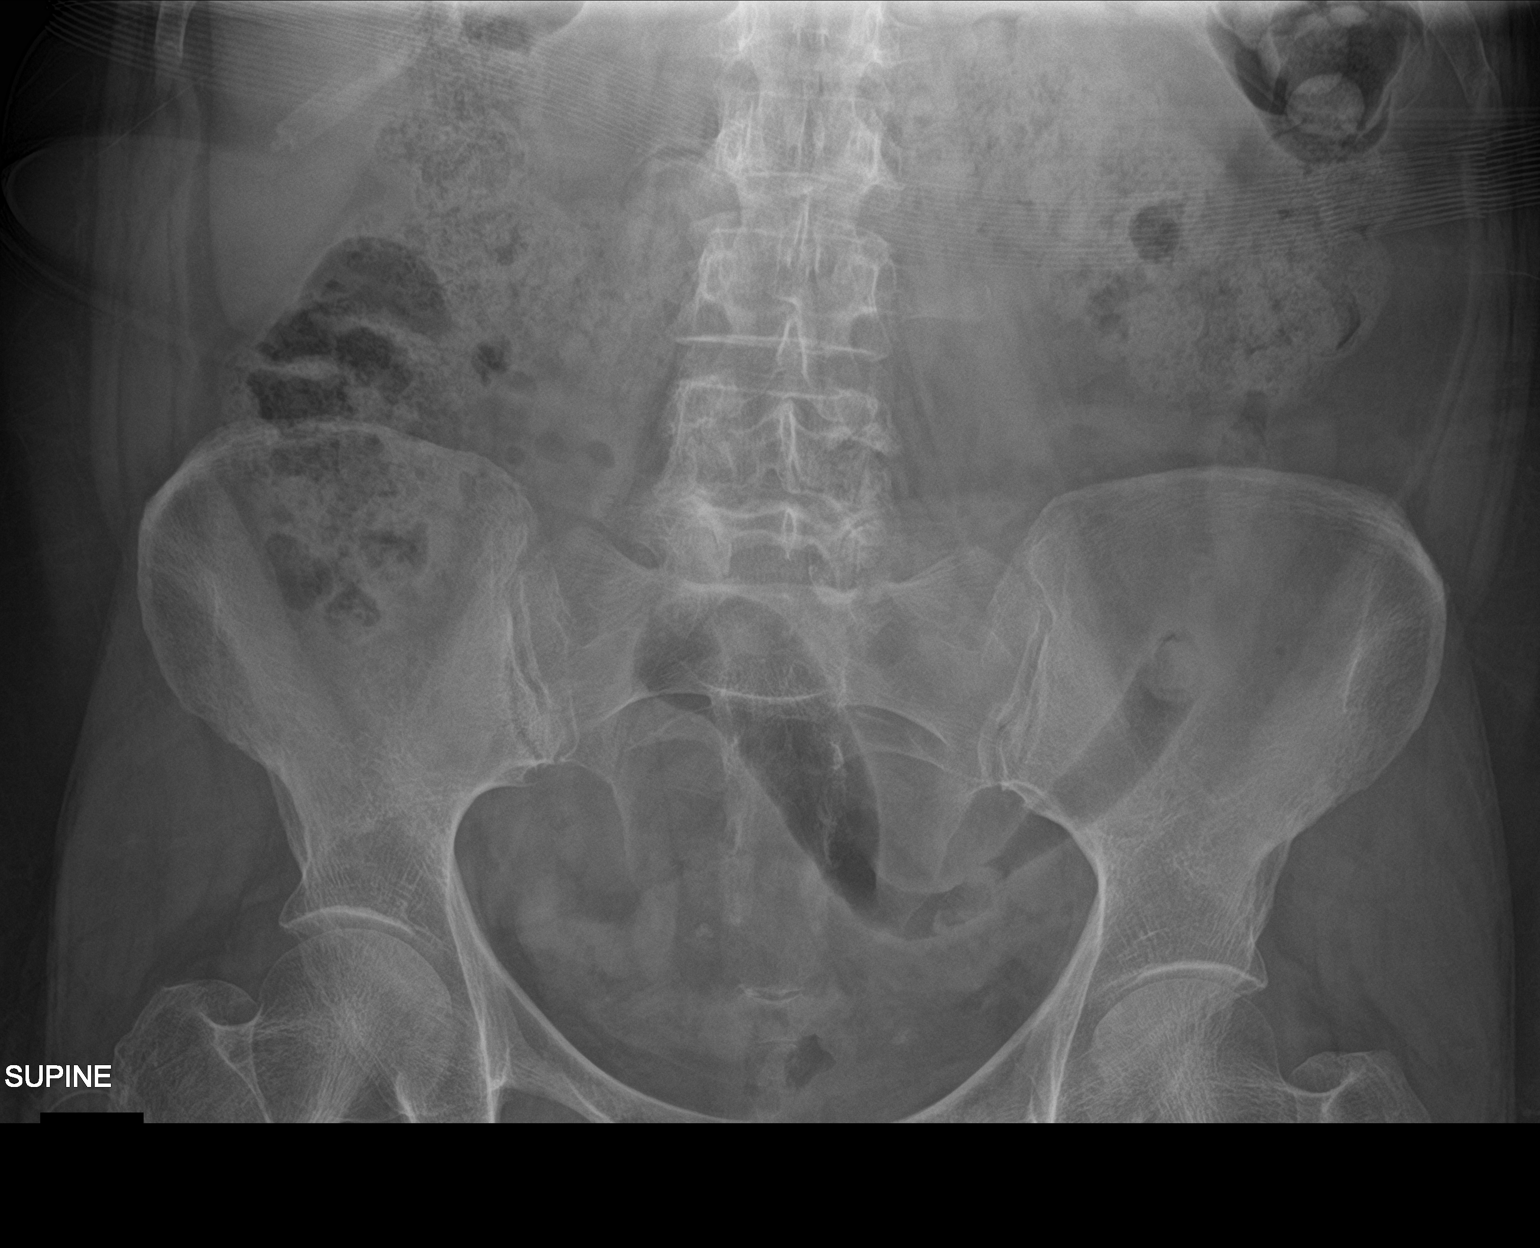

[abdomen kub (2 of 2)]
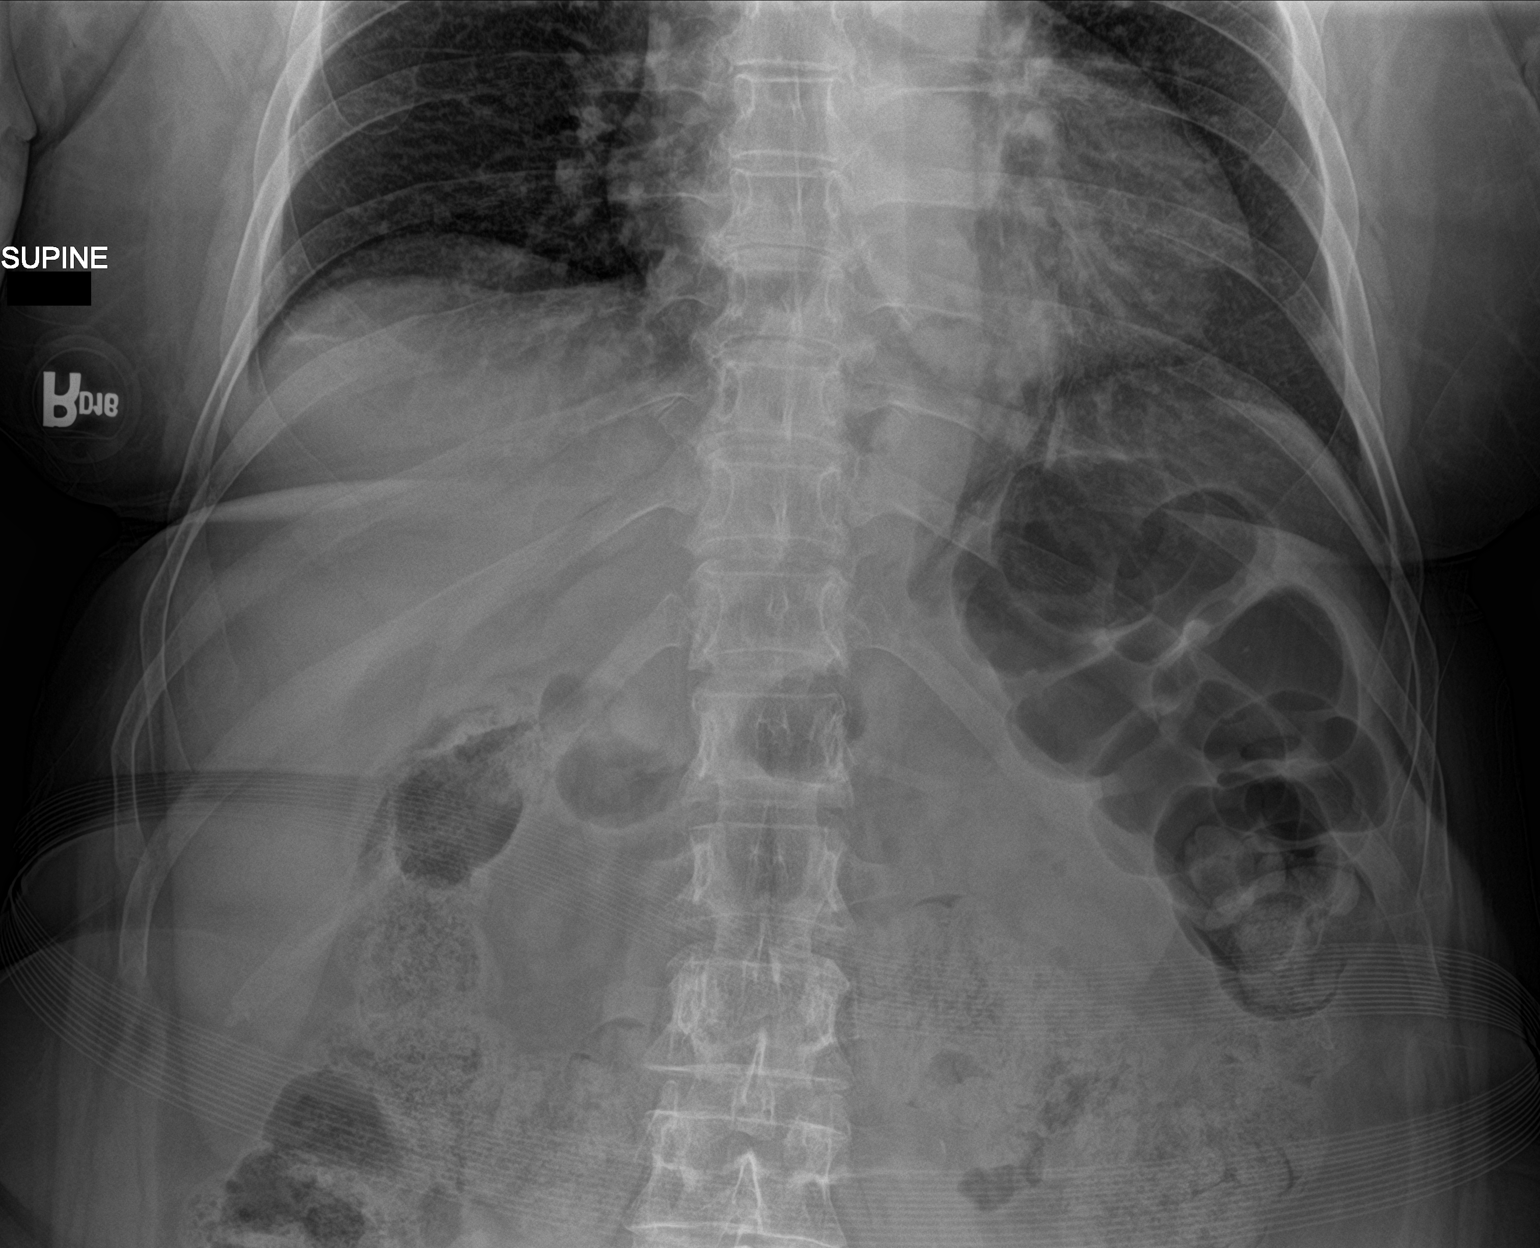

[2 of 2 positions shown; findings below may reference images not displayed]

FINDINGS: The bowel gas pattern is normal. No radio-opaque calculi or other
significant radiographic abnormality are seen.
IMPRESSION: No definite evidence of nephrolithiasis.

## 2021-06-20 IMAGING — CR DG ABDOMEN 1V
1 series · 2 of 2 positions shown · non-contrast
Comparison: Abdominal x-ray dated March 31, 2020.

CLINICAL DATA: Follow-up left nephrolithiasis.  Recent lithotripsy.

EXAM:
ABDOMEN - 1 VIEW

[Series 1: dg abd 1 view · 0.14mm/px · 2 of 2 slices shown]
[im 1/2]
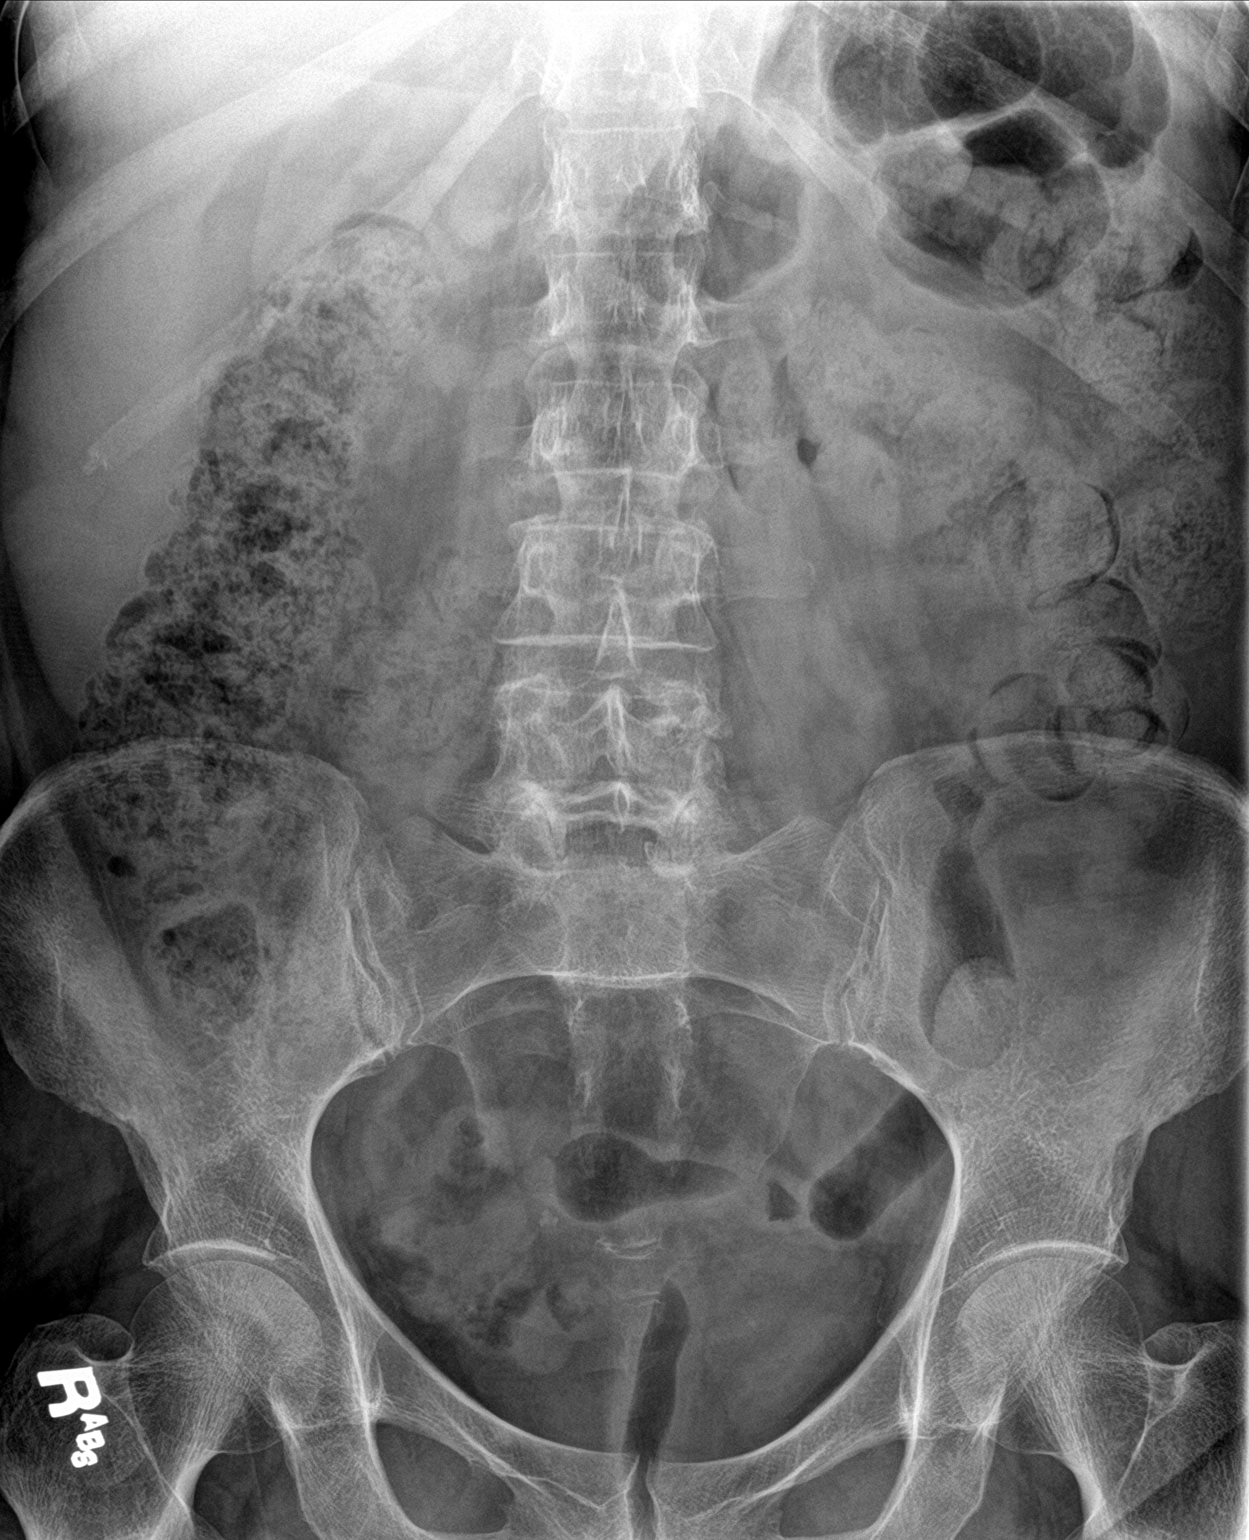
[im 2/2]
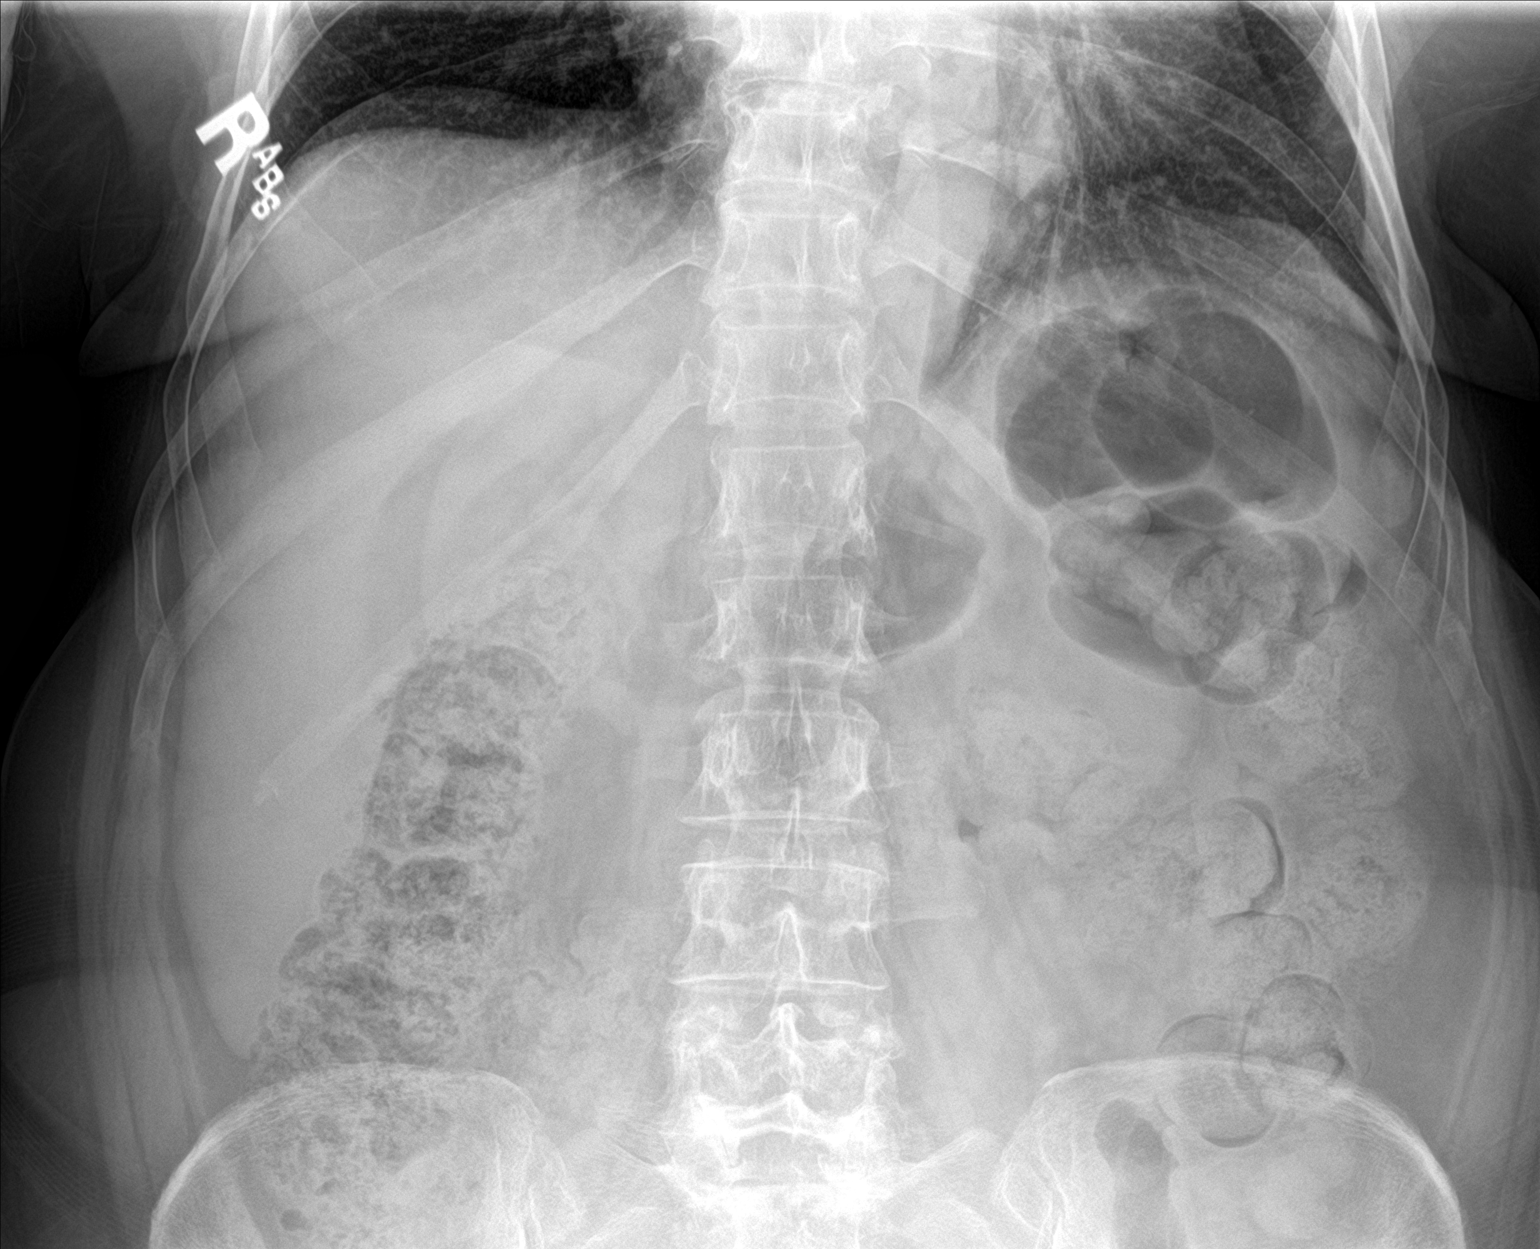

[2 of 2 positions shown; findings below may reference images not displayed]

FINDINGS: The bowel gas pattern is normal. Moderate colonic stool burden.
Previously seen calcification in the left lower pelvis is no longer
identified. No radio-opaque calculi or other significant
radiographic abnormality are seen.
IMPRESSION: 1. Previously seen left UVJ calculus is no longer identified.

## 2021-07-11 ENCOUNTER — Encounter: Payer: Self-pay | Admitting: Family Medicine

## 2021-07-14 ENCOUNTER — Other Ambulatory Visit: Payer: Self-pay | Admitting: Family Medicine

## 2021-07-14 NOTE — Telephone Encounter (Signed)
Requested Prescriptions  Pending Prescriptions Disp Refills  . citalopram (CELEXA) 40 MG tablet [Pharmacy Med Name: CITALOPRAM 40MG TABLETS] 90 tablet 0    Sig: TAKE 1 TABLET(40 MG) BY MOUTH DAILY     Psychiatry:  Antidepressants - SSRI Passed - 07/14/2021  7:09 AM      Passed - Completed PHQ-2 or PHQ-9 in the last 360 days      Passed - Valid encounter within last 6 months    Recent Outpatient Visits          3 months ago Mild episode of recurrent major depressive disorder [facility])   [facility] [place], [name], MD   7 months ago Trochanteric bursitis of both hips   [facility] [name], [name], [place]   1 year ago [facility] [employer] preventive visit   [name], [place]   1 year ago Chronic universal ulcerative colitis [facility])   [place], [name], [place]   2 years ago Chronic universal ulcerative colitis [facility])   [name] [name], NP      Future Appointments            In 1 week [name], [name], PA-C [facility] [place], [name]

## 2021-07-25 ENCOUNTER — Encounter: Payer: Self-pay | Admitting: Physician Assistant

## 2021-07-25 ENCOUNTER — Other Ambulatory Visit: Payer: Self-pay

## 2021-07-25 ENCOUNTER — Ambulatory Visit (INDEPENDENT_AMBULATORY_CARE_PROVIDER_SITE_OTHER): Payer: Medicare Other | Admitting: Physician Assistant

## 2021-07-25 VITALS — BP 109/70 | HR 55 | Ht 63.0 in | Wt 163.5 lb

## 2021-07-25 DIAGNOSIS — E782 Mixed hyperlipidemia: Secondary | ICD-10-CM

## 2021-07-25 DIAGNOSIS — F330 Major depressive disorder, recurrent, mild: Secondary | ICD-10-CM | POA: Diagnosis not present

## 2021-07-25 DIAGNOSIS — Z0000 Encounter for general adult medical examination without abnormal findings: Secondary | ICD-10-CM

## 2021-07-25 DIAGNOSIS — K5100 Ulcerative (chronic) pancolitis without complications: Secondary | ICD-10-CM | POA: Diagnosis not present

## 2021-07-25 DIAGNOSIS — R7303 Prediabetes: Secondary | ICD-10-CM | POA: Diagnosis not present

## 2021-07-25 NOTE — Progress Notes (Signed)
Complete physical exam   Patient: Karla [name]   DOB: 1956/01/01   65 y.o. Female  MRN: [id number] Visit Date: 07/25/2021  Today's healthcare provider: Mikey Kirschner, PA-C   Chief Complaint  Patient presents with   Annual Exam   Subjective    Karla [name] is a 65 y.o. female w/ a history of UC on stelara who presents today for a complete physical exam.  She reports consuming a general diet. She reports walking 5xs weekly for 20 minutes She generally feels well. She reports sleeping fairly well, takes benadryl or melatonin to sleep. She does not have additional problems to discuss today.  She currently lives with her mother who has dementia, and is her full time caregiver.   Past Medical History:  Diagnosis Date   GERD (gastroesophageal reflux disease)    Insomnia    Restless leg syndrome    Ulcerative colitis ([place])    Vertigo    Past Surgical History:  Procedure Laterality Date   EXTRACORPOREAL SHOCK WAVE LITHOTRIPSY Left 03/31/2020   Procedure: EXTRACORPOREAL SHOCK WAVE LITHOTRIPSY (ESWL);  Surgeon: Hollice Espy, MD;  Location: [facility] ORS;  Service: Urology;  Laterality: Left;   NECK SURGERY     SHOULDER SURGERY Left    Social History   Socioeconomic History   Marital status: Single    Spouse name: Not on file   Number of children: Not on file   Years of education: Not on file   Highest education level: Not on file  Occupational History   Not on file  Tobacco Use   Smoking status: Never   Smokeless tobacco: Never  Vaping Use   Vaping Use: Never used  Substance and Sexual Activity   Alcohol use: Never   Drug use: Never   Sexual activity: Not Currently  Other Topics Concern   Not on file  Social History Narrative   Not on file   Social Determinants of Health   Financial Resource Strain: Not on file  Food Insecurity: Not on file  Transportation Needs: Not on file  Physical Activity: Not on file  Stress: Not on file  Social Connections: Not on  file  Intimate Partner Violence: Not on file   Family Status  Relation Name Status   Mother  Alive   Father  Deceased   Sister  Alive   MGM  Deceased   MGF  Deceased   PGM  Deceased   PGF  Deceased   Brother  Alive   Family History  Problem Relation Age of Onset   Congestive Heart Failure Mother    COPD Mother    Macular degeneration Mother    Vitiligo Mother    Arthritis Father    Diabetes Father    Breast cancer Paternal Grandmother    Allergies  Allergen Reactions   Mesalamine     Other reaction(s): Other (See Comments), Other (See Comments) pancreatitis Pancreatitis Pancreatitis    Prednisone Itching and Palpitations    Other reaction(s): Other (See Comments) Hair loss Alopecia    Amoxicillin Other (See Comments)    Other reaction(s): Unknown unknown unknown unknown    Metronidazole Nausea And Vomiting    Abdominal pain, dysgeusia Abdominal pain, dysgeusia Abdominal pain, dysgeusia    Trazodone And Nefazodone Other (See Comments)    Other reaction(s): Other (See Comments) Severe back pack with muscle aches Severe back pack with muscle aches Severe back pack with muscle aches     Patient Care Team: Virginia Crews,  MD as PCP - General (Family Medicine)   Medications: Outpatient Medications Prior to Visit  Medication Sig   acetaminophen (TYLENOL) 325 MG tablet Take by mouth.   citalopram (CELEXA) 40 MG tablet TAKE 1 TABLET(40 MG) BY MOUTH DAILY   diphenhydrAMINE (BENADRYL) 25 MG tablet Take 25 mg by mouth every 6 (six) hours as needed.   Melatonin 5 MG SUBL Place 5 mg under the tongue at bedtime as needed.   omeprazole (PRILOSEC) 20 MG capsule Take 1 capsule (20 mg total) by mouth daily.   ustekinumab (STELARA) 90 MG/ML SOSY injection 25m subcutaneous every 8weeks   No facility-administered medications prior to visit.    Review of Systems  Constitutional:  Positive for fatigue.  HENT:  Positive for hearing loss.   Eyes:  Positive for  photophobia.  Cardiovascular:  Positive for palpitations.  Gastrointestinal:  Positive for abdominal pain and constipation.  Endocrine: Negative.   Genitourinary: Negative.   Musculoskeletal:  Positive for arthralgias and neck stiffness.  Skin: Negative.   Allergic/Immunologic: Negative.   Neurological: Negative.   Hematological: Negative.   Psychiatric/Behavioral:  Positive for decreased concentration.   All other systems reviewed and are negative.  Last CBC Lab Results  Component Value Date   WBC 6.0 03/23/2021   HGB 12.7 03/23/2021   HCT 39.9 03/23/2021   MCV 85 03/23/2021   MCH 27.2 03/23/2021   RDW 13.8 03/23/2021   PLT 304 070/26/3785  Last metabolic panel Lab Results  Component Value Date   GLUCOSE 96 03/23/2021   NA 140 03/23/2021   K 4.3 03/23/2021   CL 102 03/23/2021   CO2 22 03/23/2021   BUN 23 03/23/2021   CREATININE 0.96 03/23/2021   EGFR 65 03/23/2021   CALCIUM 9.6 03/23/2021   PROT 7.2 03/23/2021   ALBUMIN 4.2 03/23/2021   LABGLOB 3.0 03/23/2021   AGRATIO 1.4 03/23/2021   BILITOT 0.3 03/23/2021   ALKPHOS 92 03/23/2021   AST 17 03/23/2021   ALT 11 03/23/2021   ANIONGAP 11 03/27/2020   Last lipids Lab Results  Component Value Date   CHOL 219 (H) 03/23/2021   HDL 34 (L) 03/23/2021   LDLCALC 132 (H) 03/23/2021   TRIG 297 (H) 03/23/2021   CHOLHDL 6.4 (H) 03/23/2021   Last hemoglobin A1c Lab Results  Component Value Date   HGBA1C 6.3 (H) 03/23/2021   Last thyroid functions Lab Results  Component Value Date   TSH 2.420 03/23/2021   Last vitamin D No results found for: 25OHVITD2, 25OHVITD3, VD25OH Last vitamin B12 and Folate No results found for: VITAMINB12, FOLATE    Objective    BP 109/70 (BP Location: Right Arm, Patient Position: Sitting, Cuff Size: Normal)   Pulse (!) 55   Ht _0  (1.6 m)   Wt 163 lb 8 oz (74.2 kg)   SpO2 97%   BMI 28.96 kg/m  BP Readings from Last 3 Encounters:  07/25/21 109/70  03/23/21 110/80  04/08/20  107/74   Wt Readings from Last 3 Encounters:  07/25/21 163 lb 8 oz (74.2 kg)  03/23/21 164 lb (74.4 kg)  04/08/20 163 lb 12.8 oz (74.3 kg)      Physical Exam Constitutional:      General: She is awake.     Appearance: She is well-developed.  HENT:     Head: Normocephalic.     Right Ear: Tympanic membrane normal.     Left Ear: Tympanic membrane normal.  Eyes:     Conjunctiva/sclera: Conjunctivae normal.  Pupils: Pupils are equal, round, and reactive to light.  Neck:     Thyroid: No thyroid mass or thyromegaly.  Cardiovascular:     Rate and Rhythm: Normal rate and regular rhythm.     Heart sounds: Normal heart sounds.  Pulmonary:     Effort: Pulmonary effort is normal.     Breath sounds: Normal breath sounds.  Abdominal:     Palpations: Abdomen is soft.     Tenderness: There is no abdominal tenderness.  Musculoskeletal:     Right lower leg: No swelling.     Left lower leg: No swelling.  Lymphadenopathy:     Cervical: No cervical adenopathy.  Skin:    General: Skin is warm.  Neurological:     Mental Status: She is alert and oriented to person, place, and time.  Psychiatric:        Attention and Perception: Attention normal.        Mood and Affect: Mood normal.        Speech: Speech normal.        Behavior: Behavior is cooperative.     Last depression screening scores PHQ 2/9 Scores 07/25/2021 03/23/2021 04/08/2020  PHQ - 2 Score _0 PHQ- 9 Score _1 Last fall risk screening Fall Risk  07/25/2021  Falls in the past year? 1  Number falls in past yr: 0  Injury with Fall? 1  Risk for fall due to : History of fall(s)  Follow up -   Last Audit-C alcohol use screening Alcohol Use Disorder Test (AUDIT) 07/25/2021  1. How often do you have a drink containing alcohol? 0  2. How many drinks containing alcohol do you have on a typical day when you are drinking? 0  3. How often do you have six or more drinks on one occasion? 0  AUDIT-C Score 0  Alcohol Brief  Interventions/Follow-up -   A score of 3 or more in women, and 4 or more in men indicates increased risk for alcohol abuse, EXCEPT if all of the points are from question 1   No results found for any visits on 07/25/21.  Assessment & Plan    Routine Health Maintenance and Physical Exam  Exercise Activities and Dietary recommendations  Goals   None     Immunization History  Administered Date(s) Administered   Influenza Split 08/11/2012   Influenza,inj,Quad PF,6+ Mos 06/06/2018   PPD Test 05/16/2018   Pneumococcal Conjugate-13 08/28/2018   Pneumococcal Polysaccharide-23 04/08/2020   Tdap 05/23/2010    Health Maintenance  Topic Date Due   COVID-19 Vaccine (1) Never done   Zoster Vaccines- Shingrix (1 of 2) 10/25/2021 (Originally 05/13/1973)   INFLUENZA VACCINE  12/22/2021 (Originally 04/24/2021)   TETANUS/TDAP  07/25/2022 (Originally 05/23/2020)   MAMMOGRAM  04/25/2022   COLONOSCOPY (Pts 45-74yr Insurance coverage will need to be confirmed)  11/25/2028   Pneumonia Vaccine 67 Years old  Completed   DEXA SCAN  Completed   Hepatitis C Screening  Completed   HPV VACCINES  Aged Out    Discussed health benefits of physical activity, and encouraged her to engage in regular exercise appropriate for her age and condition.  Encouraged influenza, shingles, and tdap vaccines. Problem List Items Addressed This Visit       Digestive   Chronic universal ulcerative colitis ([place]     Other   Depression   Other Visit Diagnoses     Physical exam    -  Primary  Relevant Orders   HgB A1c   DG Bone Density   Mixed hyperlipidemia       Relevant Orders   Lipid panel   Comprehensive Metabolic Panel (CMET)   Prediabetes       Relevant Orders   HgB A1c       Return in about 6 months (around 01/22/2022).     I, Mikey Kirschner, PA-C have reviewed all documentation for this visit. The documentation on 07/25/2021 for the exam, diagnosis, procedures, and orders are all accurate and  complete.    Mikey Kirschner, PA-C  [facility] [phone] (phone) [phone] (fax)  [place]

## 2021-07-25 NOTE — Patient Instructions (Signed)
Preventive Care 67 Years and Older, Female Preventive care refers to lifestyle choices and visits with your health care provider that can promote health and wellness. This includes: A yearly physical exam. This is also called an annual wellness visit. Regular dental and eye exams. Immunizations. Screening for certain conditions. Healthy lifestyle choices, such as: Eating a healthy diet. Getting regular exercise. Not using drugs or products that contain nicotine and tobacco. Limiting alcohol use. What can I expect for my preventive care visit? Physical exam Your health care provider will check your: Height and weight. These may be used to calculate your BMI (body mass index). BMI is a measurement that tells if you are at a healthy weight. Heart rate and blood pressure. Body temperature. Skin for abnormal spots. Counseling Your health care provider may ask you questions about your: Past medical problems. Family's medical history. Alcohol, tobacco, and drug use. Emotional well-being. Home life and relationship well-being. Sexual activity. Diet, exercise, and sleep habits. History of falls. Memory and ability to understand (cognition). Work and work Statistician. Pregnancy and menstrual history. Access to firearms. What immunizations do I need? Vaccines are usually given at various ages, according to a schedule. Your health care provider will recommend vaccines for you based on your age, medical history, and lifestyle or other factors, such as travel or where you work. What tests do I need? Blood tests Lipid and cholesterol levels. These may be checked every 5 years, or more often depending on your overall health. Hepatitis C test. Hepatitis B test. Screening Lung cancer screening. You may have this screening every year starting at age 67 if you have a 30-pack-year history of smoking and currently smoke or have quit within the past 15 years. Colorectal cancer screening. All  adults should have this screening starting at age 67 and continuing until age 9. Your health care provider may recommend screening at age 67 if you are at increased risk. You will have tests every 1-10 years, depending on your results and the type of screening test. Diabetes screening. This is done by checking your blood sugar (glucose) after you have not eaten for a while (fasting). You may have this done every 1-3 years. Mammogram. This may be done every 1-2 years. Talk with your health care provider about how often you should have regular mammograms. Abdominal aortic aneurysm (AAA) screening. You may need this if you are a current or former smoker. BRCA-related cancer screening. This may be done if you have a family history of breast, ovarian, tubal, or peritoneal cancers. Other tests STD (sexually transmitted disease) testing, if you are at risk. Bone density scan. This is done to screen for osteoporosis. You may have this done starting at age 67. Talk with your health care provider about your test results, treatment options, and if necessary, the need for more tests. Follow these instructions at home: Eating and drinking  Eat a diet that includes fresh fruits and vegetables, whole grains, lean protein, and low-fat dairy products. Limit your intake of foods with high amounts of sugar, saturated fats, and salt. Take vitamin and mineral supplements as recommended by your health care provider. Do not drink alcohol if your health care provider tells you not to drink. If you drink alcohol: Limit how much you have to 0-1 drink a day. Be aware of how much alcohol is in your drink. In the [place] one drink equals one 12 oz bottle of beer (355 mL), one 5 oz glass of wine (148 mL), or one  1 oz glass of hard liquor (44 mL). Lifestyle Take daily care of your teeth and gums. Brush your teeth every morning and night with fluoride toothpaste. Floss one time each day. Stay active. Exercise for at least  30 minutes 5 or more days each week. Do not use any products that contain nicotine or tobacco, such as cigarettes, e-cigarettes, and chewing tobacco. If you need help quitting, ask your health care provider. Do not use drugs. If you are sexually active, practice safe sex. Use a condom or other form of protection in order to prevent STIs (sexually transmitted infections). Talk with your health care provider about taking a low-dose aspirin or statin. Find healthy ways to cope with stress, such as: Meditation, yoga, or listening to music. Journaling. Talking to a trusted person. Spending time with friends and family. Safety Always wear your seat belt while driving or riding in a vehicle. Do not drive: If you have been drinking alcohol. Do not ride with someone who has been drinking. When you are tired or distracted. While texting. Wear a helmet and other protective equipment during sports activities. If you have firearms in your house, make sure you follow all gun safety procedures. What's next? Visit your health care provider once a year for an annual wellness visit. Ask your health care provider how often you should have your eyes and teeth checked. Stay up to date on all vaccines. This information is not intended to replace advice given to you by your health care provider. Make sure you discuss any questions you have with your health care provider. Document Revised: 11/18/2020 Document Reviewed: 09/04/2018 Elsevier Patient Education  2022 [employer].

## 2021-07-28 ENCOUNTER — Ambulatory Visit: Payer: Self-pay | Admitting: *Deleted

## 2021-07-28 DIAGNOSIS — E782 Mixed hyperlipidemia: Secondary | ICD-10-CM | POA: Diagnosis not present

## 2021-07-28 DIAGNOSIS — R7303 Prediabetes: Secondary | ICD-10-CM | POA: Diagnosis not present

## 2021-07-28 DIAGNOSIS — Z0000 Encounter for general adult medical examination without abnormal findings: Secondary | ICD-10-CM | POA: Diagnosis not present

## 2021-07-28 NOTE — Telephone Encounter (Signed)
Called received to assist with locating information regarding patient lab work from today . No documentation noted regarding labs from 07/28/21. Agent reports patient returned a call to go over lab results.

## 2021-07-29 LAB — COMPREHENSIVE METABOLIC PANEL
ALT: 10 IU/L (ref 0–32)
AST: 12 IU/L (ref 0–40)
Albumin/Globulin Ratio: 1.5 (ref 1.2–2.2)
Albumin: 4.2 g/dL (ref 3.8–4.8)
Alkaline Phosphatase: 92 IU/L (ref 44–121)
BUN/Creatinine Ratio: 20 (ref 12–28)
BUN: 17 mg/dL (ref 8–27)
Bilirubin Total: 0.3 mg/dL (ref 0.0–1.2)
CO2: 23 mmol/L (ref 20–29)
Calcium: 9.4 mg/dL (ref 8.7–10.3)
Chloride: 105 mmol/L (ref 96–106)
Creatinine, Ser: 0.84 mg/dL (ref 0.57–1.00)
Globulin, Total: 2.8 g/dL (ref 1.5–4.5)
Glucose: 109 mg/dL — ABNORMAL HIGH (ref 70–99)
Potassium: 4.4 mmol/L (ref 3.5–5.2)
Sodium: 140 mmol/L (ref 134–144)
Total Protein: 7 g/dL (ref 6.0–8.5)
eGFR: 76 mL/min/{1.73_m2} (ref >59)

## 2021-07-29 LAB — HEMOGLOBIN A1C
Est. average glucose Bld gHb Est-mCnc: 128 mg/dL
Hgb A1c MFr Bld: 6.1 % — ABNORMAL HIGH (ref 4.8–5.6)

## 2021-07-29 LAB — LIPID PANEL
Chol/HDL Ratio: 6.4 ratio — ABNORMAL HIGH (ref 0.0–4.4)
Cholesterol, Total: 238 mg/dL — ABNORMAL HIGH (ref 100–199)
HDL: 37 mg/dL — ABNORMAL LOW (ref >39)
LDL Chol Calc (NIH): 173 mg/dL — ABNORMAL HIGH (ref 0–99)
Triglycerides: 153 mg/dL — ABNORMAL HIGH (ref 0–149)
VLDL Cholesterol Cal: 28 mg/dL (ref 5–40)

## 2021-07-31 NOTE — Telephone Encounter (Signed)
Copied from [place]. Topic: General - Call Back - No Documentation >> Jul 28, 2021  6:54 PM Erick Blinks wrote: Reason for CRM: Pt returned call to Parke Poisson, please advise

## 2021-08-02 ENCOUNTER — Other Ambulatory Visit: Payer: Self-pay | Admitting: Physician Assistant

## 2021-08-02 DIAGNOSIS — E782 Mixed hyperlipidemia: Secondary | ICD-10-CM

## 2021-08-02 MED ORDER — ATORVASTATIN CALCIUM 10 MG PO TABS: 10.0000 mg | ORAL_TABLET | Freq: Every day | ORAL | 3 refills | Status: DC

## 2021-10-19 ENCOUNTER — Other Ambulatory Visit: Payer: Self-pay | Admitting: Family Medicine

## 2021-10-19 NOTE — Telephone Encounter (Signed)
Requested Prescriptions  Pending Prescriptions Disp Refills   citalopram (CELEXA) 40 MG tablet [Pharmacy Med Name: CITALOPRAM 40MG TABLETS] 90 tablet 1    Sig: TAKE 1 TABLET(40 MG) BY MOUTH DAILY     Psychiatry:  Antidepressants - SSRI Passed - 10/19/2021  4:38 PM      Passed - Completed PHQ-2 or PHQ-9 in the last 360 days      Passed - Valid encounter within last 6 months    Recent Outpatient Visits          2 months ago Physical exam   [facility] Mikey Kirschner, PA-C   7 months ago Mild episode of recurrent major depressive disorder [facility])   [facility] [place], [name], MD   11 months ago Trochanteric bursitis of both hips   [facility] [place], [name], [place]   1 year ago [facility] [employer] preventive visit   [place], [place]   1 year ago Chronic universal ulcerative colitis [facility])   [facility] [place], [place], [place]

## 2021-10-24 ENCOUNTER — Encounter: Payer: Self-pay | Admitting: Physician Assistant

## 2021-12-05 MED ORDER — BISACODYL 5 MG TABLET,DELAYED RELEASE
ORAL_TABLET | Freq: Once | ORAL | 0 refills | 1 days | Status: CP
Start: 2021-12-05 — End: 2021-12-05

## 2021-12-05 MED ORDER — POLYETHYLENE GLYCOL 3350 17 GRAM/DOSE ORAL POWDER
Freq: Once | ORAL | 0 refills | 1.00000 days | Status: CP
Start: 2021-12-05 — End: 2021-12-05
  Filled 2021-12-13: qty 119, 1d supply, fill #0
  Filled 2021-12-13: qty 238, 1d supply, fill #0

## 2021-12-05 MED ORDER — SIMETHICONE 125 MG CHEWABLE TABLET
ORAL_TABLET | 0 refills | 0 days | Status: CP
Start: 2021-12-05 — End: ?
  Filled 2021-12-13: qty 4, 2d supply, fill #0

## 2021-12-13 MED FILL — BISACODYL 5 MG TABLET,DELAYED RELEASE: ORAL | 1 days supply | Qty: 2 | Fill #0

## 2021-12-18 ENCOUNTER — Other Ambulatory Visit: Payer: Self-pay | Admitting: Physician Assistant

## 2022-01-03 ENCOUNTER — Ambulatory Visit: Admit: 2022-01-03 | Discharge: 2022-01-03 | Payer: MEDICARE

## 2022-01-03 ENCOUNTER — Encounter: Admit: 2022-01-03 | Discharge: 2022-01-03 | Payer: MEDICARE | Attending: Anesthesiology | Primary: Anesthesiology

## 2022-01-03 DIAGNOSIS — K5150 Left sided colitis without complications: Secondary | ICD-10-CM | POA: Diagnosis not present

## 2022-01-03 DIAGNOSIS — K51019 Ulcerative (chronic) pancolitis with unspecified complications: Secondary | ICD-10-CM | POA: Diagnosis not present

## 2022-01-04 DIAGNOSIS — K51019 Ulcerative (chronic) pancolitis with unspecified complications: Principal | ICD-10-CM

## 2022-01-04 MED ORDER — USTEKINUMAB 90 MG/ML SUBCUTANEOUS SYRINGE
5 refills | 0 days | Status: CP
Start: 2022-01-04 — End: ?

## 2022-01-08 DIAGNOSIS — K5190 Ulcerative colitis, unspecified, without complications: Secondary | ICD-10-CM | POA: Diagnosis not present

## 2022-01-17 ENCOUNTER — Encounter: Payer: Self-pay | Admitting: Family Medicine

## 2022-01-19 ENCOUNTER — Ambulatory Visit: Payer: Self-pay

## 2022-01-19 NOTE — Telephone Encounter (Signed)
Summary: numbness in leg  ? Patient called in and stated that she have a lump on back of her left leg for a while now and it started her leg feeling numb and she is concerned. Say that it was fine as long as it has been on her but now she think it is fooling with her nerves that why the numbness came in. Please call patient at Ph# [phone]   ?  ?Called pt - LMOM - Unable to contact pt. ? ?

## 2022-01-19 NOTE — Telephone Encounter (Signed)
Summary: numbness in leg  ? Patient called in and stated that she have a lump on back of her left leg for a while now and it started her leg feeling numb and she is concerned. Say that it was fine as long as it has been on her but now she think it is fooling with her nerves that why the numbness came in. Please call patient at Ph# [phone]   ?  ? ?Called pt - lmom to return call ?

## 2022-01-19 NOTE — Telephone Encounter (Signed)
Summary: numbness in leg  ? Patient called in and stated that she have a lump on back of her left leg for a while now and it started her leg feeling numb and she is concerned. Say that it was fine as long as it has been on her but now she think it is fooling with her nerves that why the numbness came in. Please call patient at Ph# [phone]   ?  ?Called Pt - LMOM to return call. ?

## 2022-01-22 NOTE — Telephone Encounter (Signed)
Noted  

## 2022-01-22 NOTE — Telephone Encounter (Signed)
?  Chief Complaint: Area of numbness to left leg at back of thigh. Has a quarter size soft lump there. ?Symptoms: Above ?Frequency: Last week ?Pertinent Negatives: Patient denies weakness ?Disposition: [] ED /[] Urgent Care (no appt availability in office) / [x] Appointment(In office/virtual)/ []  [facility]/ [] Home Care/ [] Refused Recommended Disposition /[] [facility]/ []  Follow-up with PCP ?Additional Notes: Appointment made for tomorrow.  ?Reason for Disposition ?? [1] Tingling (e.g., pins and needles) of the face, arm / hand, or leg / foot on one side of the body AND [2] present now (Exceptions: chronic/recurrent symptom lasting > 4 weeks or tingling from known cause, such as: bumped elbow, carpal tunnel syndrome, pinched nerve, frostbite) ? ?Answer Assessment - Initial Assessment Questions ?1. SYMPTOM: "What is the main symptom you are concerned about?" (e.g., weakness, numbness) ?    Numbness to left leg ?2. ONSET: "When did this start?" (minutes, hours, days; while sleeping) ?    1 week ago ?3. LAST NORMAL: "When was the last time you (the patient) were normal (no symptoms)?" ?    1 week ago ?4. PATTERN "Does this come and go, or has it been constant since it started?"  "Is it present now?" ?    Comes and goes ?5. CARDIAC SYMPTOMS: "Have you had any of the following symptoms: chest pain, difficulty breathing, palpitations?" ?    No ?6. NEUROLOGIC SYMPTOMS: "Have you had any of the following symptoms: headache, dizziness, vision loss, double vision, changes in speech, unsteady on your feet?" ?    No ?7. OTHER SYMPTOMS: "Do you have any other symptoms?" ?    Just numbness ?8. PREGNANCY: "Is there any chance you are pregnant?" "When was your last menstrual period?" ?    No ? ?Protocols used: Neurologic Deficit-A-AH ? ?

## 2022-01-22 NOTE — Telephone Encounter (Signed)
FYI-patient is scheduled for tomorrow with Dr. Caryn Section in the pm. ?

## 2022-01-23 ENCOUNTER — Ambulatory Visit (INDEPENDENT_AMBULATORY_CARE_PROVIDER_SITE_OTHER): Payer: Medicare Other | Admitting: Family Medicine

## 2022-01-23 ENCOUNTER — Encounter: Payer: Self-pay | Admitting: Family Medicine

## 2022-01-23 VITALS — BP 109/69 | HR 83 | Temp 98.4°F | Resp 16 | Wt 161.6 lb

## 2022-01-23 DIAGNOSIS — R2241 Localized swelling, mass and lump, right lower limb: Secondary | ICD-10-CM | POA: Diagnosis not present

## 2022-01-23 NOTE — Progress Notes (Signed)
?  ? ? ?  I,Jana Robinson,acting as a scribe for Lelon Huh, MD.,have documented all relevant documentation on the behalf of Lelon Huh, MD,as directed by  Lelon Huh, MD while in the presence of Lelon Huh, MD. ? ?Established patient visit ? ? ?Patient: Karla [name]   DOB: 1956/05/22   66 y.o. Female  MRN: [id number] ?Visit Date: 01/23/2022 ? ?Today's healthcare provider: Lelon Huh, MD  ? ?No chief complaint on file. ? ?Subjective  ?  ?HPI  ?Patient presents for area of numbness and tingling of left leg at back of thigh that comes and goes. Numbness has been off and on for several weeks, but is located around lump in the back of her right thigh that has been present for 2-3 years and getting larger. 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  ? ?Medications: ?Outpatient Medications Prior to Visit  ?Medication Sig  ? acetaminophen (TYLENOL) 325 MG tablet Take by mouth.  ? citalopram (CELEXA) 40 MG tablet TAKE 1 TABLET(40 MG) BY MOUTH DAILY  ? diphenhydrAMINE (BENADRYL) 25 MG tablet Take 25 mg by mouth every 6 (six) hours as needed.  ? Melatonin 5 MG SUBL Place 5 mg under the tongue at bedtime as needed.  ? omeprazole (PRILOSEC) 20 MG capsule TAKE 1 CAPSULE(20 MG) BY MOUTH DAILY  ? ustekinumab (STELARA) 90 MG/ML SOSY injection 36m subcutaneous every 8weeks  ? atorvastatin (LIPITOR) 10 MG tablet Take 1 tablet (10 mg total) by mouth daily. (Patient not taking: Reported on 01/23/2022)  ? ?No facility-administered medications prior to visit.  ? ? ?Review of Systems ? ? ?  Objective  ?  ?BP 109/69 (BP Location: Left Arm, Patient Position: Sitting, Cuff Size: Normal)   Pulse 83   Temp 98.4 ?F (36.9 ?C) (Oral)   Resp 16   Wt 161 lb 9.6 oz (73.3 kg)   SpO2 100%   BMI 28.63 kg/m?  ? ? ?Physical Exam  ? ?Golf ball sized firm irregular subcutaneous mass posterior right thigh thigh. No erythema.  ? ? Assessment & Plan  ?  ? ?1. Mass of right thigh ? ?-  Ambulatory referral to General Surgery  ?   ? ?The entirety of the information documented in the History of Present Illness, Review of Systems and Physical Exam were personally obtained by me. Portions of this information were initially documented by the CMA and reviewed by me for thoroughness and accuracy.   ? ? ?DLelon Huh MD  ?[facility]?[phone](phone) ?[phone](fax) ? ?[facility] ?

## 2022-01-30 ENCOUNTER — Encounter: Payer: Self-pay | Admitting: Surgery

## 2022-01-30 ENCOUNTER — Ambulatory Visit (INDEPENDENT_AMBULATORY_CARE_PROVIDER_SITE_OTHER): Payer: Medicare Other | Admitting: Surgery

## 2022-01-30 VITALS — BP 109/72 | HR 60 | Temp 98.0°F | Ht 63.0 in | Wt 160.6 lb

## 2022-01-30 DIAGNOSIS — D1724 Benign lipomatous neoplasm of skin and subcutaneous tissue of left leg: Secondary | ICD-10-CM

## 2022-01-30 NOTE — Patient Instructions (Signed)
If you have any concerns or questions, please feel free to call our office. Follow up as needed.  ? ?Lipoma ?A lipoma is a noncancerous (benign) tumor that is made up of fat cells. This is a very common type of soft-tissue growth. Lipomas are usually found under the skin (subcutaneous). They may occur in any tissue of the body that contains fat. Common areas for lipomas to appear include the back, arms, shoulders, buttocks, and thighs. ?Lipomas grow slowly, and they are usually painless. Most lipomas do not cause problems and do not require treatment. ?What are the causes? ?The cause of this condition is not known. ?What increases the risk? ?You are more likely to develop this condition if: ?You are 91-55 years old. ?You have a family history of lipomas. ?What are the signs or symptoms? ?A lipoma usually appears as a small, round bump under the skin. In most cases, the lump will: ?Feel soft or rubbery. ?Not cause pain or other symptoms. ?However, if a lipoma is located in an area where it pushes on nerves, it can become painful or cause other symptoms. ?How is this diagnosed? ?A lipoma can usually be diagnosed with a physical exam. You may also have tests to confirm the diagnosis and to rule out other conditions. Tests may include: ?Imaging tests, such as a CT scan or an MRI. ?Removal of a tissue sample to be looked at under a microscope (biopsy). ?How is this treated? ?Treatment for this condition depends on the size of the lipoma and whether it is causing any symptoms. ?For small lipomas that are not causing problems, no treatment is needed. ?If a lipoma is bigger or it causes problems, surgery may be done to remove the lipoma. Lipomas can also be removed to improve appearance. Most often, the procedure is done after applying a medicine that numbs the area (local anesthetic). ?Liposuction may be done to reduce the size of the lipoma before it is removed through surgery, or it may be done to remove the lipoma.  Lipomas are removed with this method to limit incision size and scarring. A liposuction tube is inserted through a small incision into the lipoma, and the contents of the lipoma are removed through the tube with suction. ?Follow these instructions at home: ?Watch your lipoma for any changes. ?Keep all follow-up visits. This is important. ?Where to find more information ?OrthoInfo: orthoinfo.aaos.org ?Contact a health care provider if: ?Your lipoma becomes larger or hard. ?Your lipoma becomes painful, red, or increasingly swollen. These could be signs of infection or a more serious condition. ?Get help right away if: ?You develop tingling or numbness in an area near the lipoma. This could indicate that the lipoma is causing nerve damage. ?Summary ?A lipoma is a noncancerous tumor that is made up of fat cells. ?Most lipomas do not cause problems and do not require treatment. ?If a lipoma is bigger or it causes problems, surgery may be done to remove the lipoma. ?Contact a health care provider if your lipoma becomes larger or hard, or if it becomes painful, red, or increasingly swollen. These could be signs of infection or a more serious condition. ?This information is not intended to replace advice given to you by your health care provider. Make sure you discuss any questions you have with your health care provider. ?Document Revised: 09/29/2021 Document Reviewed: 09/29/2021 ?Elsevier Patient Education ? [place]. ? ?

## 2022-01-30 NOTE — Progress Notes (Signed)
Patient ID: Cornell Gaber, female   DOB: 05/18/1956, 66 y.o.   MRN: [id number] ? ?Chief Complaint: Left posterior thigh mass ? ?History of Present Illness ?Linzy Laury is a 66 y.o. female with a known bump of the left posterior thigh, present for about 5 years.  Reports its the same size its always been.  Denies pain, denies drainage.  She has some concerns of recent where there is been a little tingling distally from the lesion, and also proximally from the lesion.  Not alarming, just wanted to have it checked out. ? ?Past Medical History ?Past Medical History:  ?Diagnosis Date  ? GERD (gastroesophageal reflux disease)   ? Insomnia   ? Restless leg syndrome   ? Ulcerative colitis ([place])   ? Vertigo   ?  ? ? ?Past Surgical History:  ?Procedure Laterality Date  ? EXTRACORPOREAL SHOCK WAVE LITHOTRIPSY Left 03/31/2020  ? Procedure: EXTRACORPOREAL SHOCK WAVE LITHOTRIPSY (ESWL);  Surgeon: Hollice Espy, MD;  Location: [facility] ORS;  Service: Urology;  Laterality: Left;  ? NECK SURGERY    ? SHOULDER SURGERY Left   ? ? ?Allergies  ?Allergen Reactions  ? Mesalamine   ?  Other reaction(s): Other (See Comments), Other (See Comments) ?pancreatitis ?Pancreatitis ?Pancreatitis ?  ? Prednisone Itching and Palpitations  ?  Other reaction(s): Other (See Comments) ?Hair loss ?Alopecia ?  ? Amoxicillin Other (See Comments)  ?  Other reaction(s): Unknown ?unknown ?unknown ?unknown ?  ? Metronidazole Nausea And Vomiting  ?  Abdominal pain, dysgeusia ?Abdominal pain, dysgeusia ?Abdominal pain, dysgeusia ?  ? Trazodone And Nefazodone Other (See Comments)  ?  Other reaction(s): Other (See Comments) ?Severe back pack with muscle aches ?Severe back pack with muscle aches ?Severe back pack with muscle aches ?  ? ? ?Current Outpatient Medications  ?Medication Sig Dispense Refill  ? acetaminophen (TYLENOL) 325 MG tablet Take by mouth.    ? citalopram (CELEXA) 40 MG tablet TAKE 1 TABLET(40 MG) BY MOUTH DAILY 90 tablet 1  ? diphenhydrAMINE (BENADRYL)  25 MG tablet Take 25 mg by mouth every 6 (six) hours as needed.    ? Melatonin 5 MG SUBL Place 5 mg under the tongue at bedtime as needed.    ? omeprazole (PRILOSEC) 20 MG capsule TAKE 1 CAPSULE(20 MG) BY MOUTH DAILY 90 capsule 3  ? ustekinumab (STELARA) 90 MG/ML SOSY injection 60m subcutaneous every 8weeks    ? ?No current facility-administered medications for this visit.  ? ? ?Family History ?Family History  ?Problem Relation Age of Onset  ? Congestive Heart Failure Mother   ? COPD Mother   ? Macular degeneration Mother   ? Vitiligo Mother   ? Arthritis Father   ? Diabetes Father   ? Breast cancer Paternal Grandmother   ?  ? ? ?Social History ?Social History  ? ?Tobacco Use  ? Smoking status: Never  ? Smokeless tobacco: Never  ?Vaping Use  ? Vaping Use: Never used  ?Substance Use Topics  ? Alcohol use: Never  ? Drug use: Never  ?  ?  ? ? ?Review of Systems  ?Constitutional: Negative.   ?HENT: Negative.    ?Eyes: Negative.   ?Respiratory: Negative.    ?Cardiovascular: Negative.   ?Gastrointestinal:  Positive for constipation.  ?Genitourinary: Negative.   ?Skin: Negative.   ?Neurological:  Positive for tingling.  ?Psychiatric/Behavioral: Negative.    ?  ? ?Physical Exam ?Blood pressure 109/72, pulse 60, temperature 98 ?F (36.7 ?C), temperature source Oral, height 5' 3"  (1.6  m), weight 160 lb 9.6 oz (72.8 kg), SpO2 97 %. ?Last Weight  Most recent update: 01/30/2022 10:32 AM  ? ? Weight  ?72.8 kg (160 lb 9.6 oz)  ?      ? ?  ? ? ?CONSTITUTIONAL: Well developed, and nourished, appropriately responsive and aware without distress.   ?EYES: Sclera non-icteric.   ?EARS, NOSE, MOUTH AND THROAT:  The oropharynx is clear. Oral mucosa is pink and moist.  Hearing is intact to voice.  ?NECK: Trachea is midline, and there is no jugular venous distension.  ?LYMPH NODES:  Lymph nodes in the neck are not enlarged. ?RESPIRATORY:  Lungs are clear, and breath sounds are equal bilaterally. Normal respiratory effort without pathologic use  of accessory muscles. ?CARDIOVASCULAR: Heart is regular in rate and rhythm. ?GI: The abdomen is soft, nontender, and nondistended.  ?MUSCULOSKELETAL:  Symmetrical muscle tone appreciated in all four extremities.    ?Posterior mid thigh, there is a horizontally based elliptically shaped lesion approximately 2-1/2 to 3 cm in length, approximately 1 to 2 cm in cephalocaudad diameter.  Well-defined, discrete, immediately subdermal area consistent with subcu cutaneous adipose i.e. lipoma. ?SKIN: Skin turgor is normal. No pathologic skin lesions appreciated.  ?NEUROLOGIC:  Motor and sensation appear grossly normal.  Cranial nerves are grossly without defect. ?PSYCH:  Alert and oriented to person, place and time. Affect is appropriate for situation. ? ?Data Reviewed ?I have personally reviewed what is currently available of the patient's imaging, recent labs and medical records.   ?Labs:  ? ?  Latest Ref Rng & Units 03/23/2021  ?  4:34 PM 03/27/2020  ?  6:28 PM 05/05/2018  ?  1:22 PM  ?CBC  ?WBC 3.4 - 10.8 x10E3/uL 6.0   6.9   11.4    ?Hemoglobin 11.1 - 15.9 g/dL 12.7   12.1   12.3    ?Hematocrit 34.0 - 46.6 % 39.9   36.3   37.3    ?Platelets 150 - 450 x10E3/uL 304   311   359    ? ? ?  Latest Ref Rng & Units 07/28/2021  ?  9:10 AM 03/23/2021  ?  4:34 PM 03/27/2020  ?  6:28 PM  ?CMP  ?Glucose 70 - 99 mg/dL 109   96   107    ?BUN 8 - 27 mg/dL 17   23   24     ?Creatinine 0.57 - 1.00 mg/dL 0.84   0.96   1.15    ?Sodium 134 - 144 mmol/L 140   140   139    ?Potassium 3.5 - 5.2 mmol/L 4.4   4.3   3.5    ?Chloride 96 - 106 mmol/L 105   102   106    ?CO2 20 - 29 mmol/L 23   22   22     ?Calcium 8.7 - 10.3 mg/dL 9.4   9.6   8.8    ?Total Protein 6.0 - 8.5 g/dL 7.0   7.2     ?Total Bilirubin 0.0 - 1.2 mg/dL 0.3   0.3     ?Alkaline Phos 44 - 121 IU/L 92   92     ?AST 0 - 40 IU/L 12   17     ?ALT 0 - 32 IU/L 10   11     ? ? ? ? ?Imaging: ? ?Within last 24 hrs: No results found. ? ?Assessment ?   ?Lipoma left posterior thigh.  Minimally  symptomatic. ?Patient Active Problem List  ?  Diagnosis Date Noted  ? Overweight 03/23/2021  ? Palpitations 03/23/2021  ? DOE (dyspnea on exertion) 03/23/2021  ? Hiatal hernia 05/04/2019  ? 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 Depression 05/14/2018  ? 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 History of Clostridium difficile infection 06/03/2012  ? IBS (irritable bowel syndrome) 06/03/2012  ? IFG (impaired fasting glucose) 06/03/2012  ? Memory loss 06/03/2012  ? Multinodular goiter 06/03/2012  ? Obstructive sleep apnea 06/03/2012  ? Stress incontinence in female 06/03/2012  ? Other osteoporosis without current pathological fracture 06/03/2012  ? ? ?Plan ?   ?We discussed the options of proceeding with excision, and unable to completely guarantee this will improve any issues she has, she felt prudent to leave it alone for now.  Quite convinced this is benign in its etiology, glad to see this pleasant patient back again should she have reconsideration or looming issues of increasing discomfort or annoyance. ? ? ?Face-to-face time spent with the patient and accompanying care providers(if present) was 20 minutes, with more than 50% of the time spent counseling, educating, and coordinating care of the patient.   ? ?These notes generated with voice recognition software. I apologize for typographical errors. ? ?Ronny Bacon M.D., FACS ?01/30/2022, 10:58 AM ? ? ? ? ?

## 2022-03-05 DIAGNOSIS — K5190 Ulcerative colitis, unspecified, without complications: Secondary | ICD-10-CM | POA: Diagnosis not present

## 2022-03-28 ENCOUNTER — Other Ambulatory Visit: Payer: Self-pay | Admitting: Family Medicine

## 2022-03-28 DIAGNOSIS — Z1231 Encounter for screening mammogram for malignant neoplasm of breast: Secondary | ICD-10-CM

## 2022-04-23 ENCOUNTER — Telehealth: Payer: Self-pay | Admitting: Family Medicine

## 2022-04-23 NOTE — Telephone Encounter (Signed)
[facility] faxed refill request for the following medications:  citalopram (CELEXA) 40 MG tablet   Please advise.

## 2022-04-23 NOTE — Telephone Encounter (Signed)
Pt has appt with Letitia Libra Tuesday to discuss medications change.  Hold off on refill for now.

## 2022-04-24 ENCOUNTER — Other Ambulatory Visit: Payer: Self-pay | Admitting: Physician Assistant

## 2022-04-24 ENCOUNTER — Telehealth (INDEPENDENT_AMBULATORY_CARE_PROVIDER_SITE_OTHER): Payer: Medicare Other | Admitting: Physician Assistant

## 2022-04-24 DIAGNOSIS — F3289 Other specified depressive episodes: Secondary | ICD-10-CM

## 2022-04-24 MED ORDER — BUPROPION HCL ER (XL) 150 MG PO TB24: 150.0000 mg | ORAL_TABLET | Freq: Every day | ORAL | 0 refills | Status: DC

## 2022-04-24 MED ORDER — CITALOPRAM HYDROBROMIDE 40 MG PO TABS: ORAL_TABLET | ORAL | 1 refills | Status: DC

## 2022-04-24 NOTE — Telephone Encounter (Signed)
Is pt staying on this? If so, I will refill.

## 2022-04-24 NOTE — Progress Notes (Unsigned)
I,Jana Reet Scharrer,acting as a Education administrator for [employer], PA-C.,have documented all relevant documentation on the behalf of [name], PA-C,as directed by  [employer], PA-C while in the presence of [employer], PA-C.   MyChart Video Visit    Virtual Visit via Video Note   This visit type was conducted due to national recommendations for restrictions regarding the COVID-19 Pandemic (e.g. social distancing) in an effort to limit this patient's exposure and mitigate transmission in our community. This patient is at least at moderate risk for complications without adequate follow up. This format is felt to be most appropriate for this patient at this time. Physical exam was limited by quality of the video and audio technology used for the visit.   Patient location: home Provider location: [facility]   I discussed the limitations of evaluation and management by telemedicine and the availability of in person appointments. The patient expressed understanding and agreed to proceed.  Patient: Karla [name]   DOB: 13-Apr-1956   66 y.o. Female  MRN: [id number] Visit Date: 04/24/2022  Today's healthcare provider: [name], PA-C  CC: Medication change  Subjective    Patient wishes to discuss the possibility of medication change or adding another one for anxiety and depression. States she has been on Citalopram for many years. She is under a great deal of stress with caring for her 69 year old mother who lives with her. Reports it's taking a toll on her. Reports she does not want to be dependent on a mediation but feels she needs a little extra help.      04/24/2022   10:51 AM 05/04/2019    1:02 PM  GAD 7 : Generalized Anxiety Score  Nervous, Anxious, on Edge 1 0  Control/stop worrying 1 0  Worry too much - different things 1 0  Trouble relaxing 3 1  Restless 3 3  Easily annoyed or irritable 0 0  Afraid - awful might happen 0 0  Total GAD 7 Score 9 4  Anxiety  Difficulty Not difficult at all Somewhat difficult     Medications: Outpatient Medications Prior to Visit  Medication Sig   acetaminophen (TYLENOL) 325 MG tablet Take by mouth.   citalopram (CELEXA) 40 MG tablet TAKE 1 TABLET(40 MG) BY MOUTH DAILY   diphenhydrAMINE (BENADRYL) 25 MG tablet Take 25 mg by mouth every 6 (six) hours as needed.   Melatonin 5 MG SUBL Place 5 mg under the tongue at bedtime as needed.   omeprazole (PRILOSEC) 20 MG capsule TAKE 1 CAPSULE(20 MG) BY MOUTH DAILY   ustekinumab (STELARA) 90 MG/ML SOSY injection 24m subcutaneous every 8weeks   No facility-administered medications prior to visit.      01/23/2022    4:20 PM 07/25/2021    9:15 AM 03/23/2021    3:24 PM  PHQ9 SCORE ONLY  PHQ-9 Total Score 5 5 8     Review of Systems  All other systems reviewed and are negative. See HPI     Objective    There were no vitals taken for this visit.     Physical Exam Constitutional:      General: She is not in acute distress.    Appearance: Normal appearance.  HENT:     Head: Normocephalic.  Pulmonary:     Effort: Pulmonary effort is normal. No respiratory distress.  Neurological:     Mental Status: She is alert and oriented to person, place, and time. Mental status is at baseline.  Assessment & Plan    Other depression - Plan: buPROPion (WELLBUTRIN XL) 150 MG 24 hr tablet, start with min dose Continue citalopram Watch for side effects. Benefits and risks were discussed. Patient agreed and expressed his understanding.   FU as scheduled/8/15    I discussed the assessment and treatment plan with the patient. The patient was provided an opportunity to ask questions and all were answered. The patient agreed with the plan and demonstrated an understanding of the instructions.   The patient was advised to call back or seek an in-person evaluation if the symptoms worsen or if the condition fails to improve as anticipated.  I provided 17 minutes of  non-face-to-face time during this encounter.   [name], PA-C [facility] [phone] (phone) [phone] (fax)  [place]

## 2022-04-26 ENCOUNTER — Ambulatory Visit
Admission: RE | Admit: 2022-04-26 | Discharge: 2022-04-26 | Disposition: A | Discharge status: Home / Self Care | Payer: Medicare Other | Source: Ambulatory Visit | Attending: Family Medicine | Admitting: Family Medicine

## 2022-04-26 DIAGNOSIS — Z1231 Encounter for screening mammogram for malignant neoplasm of breast: Secondary | ICD-10-CM | POA: Diagnosis not present

## 2022-04-27 ENCOUNTER — Other Ambulatory Visit: Payer: Self-pay | Admitting: Family Medicine

## 2022-04-27 DIAGNOSIS — R928 Other abnormal and inconclusive findings on diagnostic imaging of breast: Secondary | ICD-10-CM

## 2022-04-27 DIAGNOSIS — N6489 Other specified disorders of breast: Secondary | ICD-10-CM

## 2022-04-27 NOTE — Progress Notes (Signed)
Incomplete breast cancer screening; patient will be contacted for further imaging to assist with completion given breast asymmetry.  Karla [name], [address] [place], [place] [address] [phone] (phone) [phone] (fax) [place]

## 2022-04-29 DIAGNOSIS — K5190 Ulcerative colitis, unspecified, without complications: Secondary | ICD-10-CM | POA: Diagnosis not present

## 2022-05-03 ENCOUNTER — Telehealth: Admit: 2022-05-03 | Discharge: 2022-05-04 | Payer: MEDICARE | Attending: Gastroenterology | Primary: Gastroenterology

## 2022-05-03 DIAGNOSIS — K51019 Ulcerative (chronic) pancolitis with unspecified complications: Secondary | ICD-10-CM | POA: Diagnosis not present

## 2022-05-07 NOTE — Progress Notes (Unsigned)
MyChart Video Visit    Virtual Visit via Video Note   This visit type was conducted due to national recommendations for restrictions regarding the COVID-19 Pandemic (e.g. social distancing) in an effort to limit this patient's exposure and mitigate transmission in our community. This patient is at least at moderate risk for complications without adequate follow up. This format is felt to be most appropriate for this patient at this time. Physical exam was limited by quality of the video and audio technology used for the visit.   Patient location: Work  Provider location: [employer]   I discussed the limitations of evaluation and management by telemedicine and the availability of in person appointments. The patient expressed understanding and agreed to proceed.  Patient: Karla [name]   DOB: 10/14/1955   66 y.o. Female  MRN: [id number] Visit Date: 05/08/2022  Today's healthcare provider: [name], PA-C   CC: Medication compliance  Subjective    Depression, Follow-up  She  was last seen for this 2 weeks ago/virtual visit.  Changes made at last visit include: Start Bupropion 150 mg tablet, start with min dose. Continue Citalopram.  She reports fair compliance with treatment. Only took Bupropion for 2 days due to forgetting to take it  She reports poor tolerance of treatment. Current symptoms include: reports depression symptoms are a little bit better.   She feels she is improved since last visit.     01/23/2022    4:20 PM 07/25/2021    9:15 AM 03/23/2021    3:24 PM  Depression screen PHQ 2/9  Decreased Interest 1 0 1  Down, Depressed, Hopeless 1 1 0  PHQ - 2 Score 2 1 1   Altered sleeping 1 1 2   Tired, decreased energy 1 1 2   Change in appetite 1 1 2   Feeling bad or failure about yourself  0 0 1  Trouble concentrating 0 1 0  Moving slowly or fidgety/restless 0 0 0  Suicidal thoughts 0 0 0  PHQ-9 Score 5 5 8   Difficult doing work/chores Somewhat  difficult Not difficult at all Somewhat difficult    -----------------------------------------------------------------------------------------    Medications: Outpatient Medications Prior to Visit  Medication Sig   acetaminophen (TYLENOL) 325 MG tablet Take by mouth.   buPROPion (WELLBUTRIN XL) 150 MG 24 hr tablet Take 1 tablet (150 mg total) by mouth daily.   citalopram (CELEXA) 40 MG tablet TAKE 1 TABLET(40 MG) BY MOUTH DAILY   diphenhydrAMINE (BENADRYL) 25 MG tablet Take 25 mg by mouth every 6 (six) hours as needed.   Melatonin 5 MG SUBL Place 5 mg under the tongue at bedtime as needed.   omeprazole (PRILOSEC) 20 MG capsule TAKE 1 CAPSULE(20 MG) BY MOUTH DAILY   ustekinumab (STELARA) 90 MG/ML SOSY injection 8m subcutaneous every 8weeks   No facility-administered medications prior to visit.    Review of Systems  All other systems reviewed and are negative. Except See HPI     Objective    There were no vitals taken for this visit.     Physical Exam Constitutional:      General: She is not in acute distress.    Appearance: Normal appearance.  HENT:     Head: Normocephalic.  Pulmonary:     Effort: Pulmonary effort is normal. No respiratory distress.  Neurological:     Mental Status: She is alert and oriented to person, place, and time. Mental status is at baseline.        Assessment &  Plan     1. Other depression Pt has been problems with compliance. She is forgetting to take this medication. Will monitor for side effects. Continue current regimen as prescribed   FU in 2 weeks. PHQ9 before the visit    I discussed the assessment and treatment plan with the patient. The patient was provided an opportunity to ask questions and all were answered. The patient agreed with the plan and demonstrated an understanding of the instructions.   The patient was advised to call back or seek an in-person evaluation if the symptoms worsen or if the condition fails to  improve as anticipated.  I provided 5-7 minutes of non-face-to-face time during this encounter. The patient was advised to call back or seek an in-person evaluation if the symptoms worsen or if the condition fails to improve as anticipated.  I discussed the assessment and treatment plan with the patient. The patient was provided an opportunity to ask questions and all were answered. The patient agreed with the plan and demonstrated an understanding of the instructions.  The entirety of the information documented in the History of Present Illness, Review of Systems and Physical Exam were personally obtained by me. Portions of this information were initially documented by the CMA and reviewed by me for thoroughness and accuracy.  Portions of this note were created using dictation software and may contain typographical errors.    [name], PA-C [facility] [phone] (phone) [phone] (fax)  [place]

## 2022-05-08 ENCOUNTER — Telehealth (INDEPENDENT_AMBULATORY_CARE_PROVIDER_SITE_OTHER): Payer: Medicare Other | Admitting: Physician Assistant

## 2022-05-08 ENCOUNTER — Telehealth: Payer: Self-pay | Admitting: Physician Assistant

## 2022-05-08 DIAGNOSIS — F3289 Other specified depressive episodes: Secondary | ICD-10-CM

## 2022-05-24 ENCOUNTER — Ambulatory Visit
Admission: RE | Admit: 2022-05-24 | Discharge: 2022-05-24 | Disposition: A | Discharge status: Home / Self Care | Payer: Medicare Other | Source: Ambulatory Visit | Attending: Family Medicine | Admitting: Family Medicine

## 2022-05-24 ENCOUNTER — Other Ambulatory Visit: Payer: Self-pay | Admitting: Family Medicine

## 2022-05-24 DIAGNOSIS — N6489 Other specified disorders of breast: Secondary | ICD-10-CM

## 2022-05-24 DIAGNOSIS — R928 Other abnormal and inconclusive findings on diagnostic imaging of breast: Secondary | ICD-10-CM

## 2022-05-24 DIAGNOSIS — R922 Inconclusive mammogram: Secondary | ICD-10-CM | POA: Diagnosis not present

## 2022-05-24 NOTE — Progress Notes (Signed)
Suspicious breast screening; recommend biopsy of R breast.   Gwyneth Sprout, [address] [place], [place] [address] [phone] (phone) [phone] (fax) [place]

## 2022-05-31 ENCOUNTER — Other Ambulatory Visit: Payer: Self-pay | Admitting: Physician Assistant

## 2022-05-31 DIAGNOSIS — F3289 Other specified depressive episodes: Secondary | ICD-10-CM

## 2022-06-12 ENCOUNTER — Ambulatory Visit
Admission: RE | Admit: 2022-06-12 | Discharge: 2022-06-12 | Disposition: A | Discharge status: Home / Self Care | Payer: Medicare Other | Source: Ambulatory Visit | Attending: Family Medicine | Admitting: Family Medicine

## 2022-06-12 DIAGNOSIS — N6489 Other specified disorders of breast: Secondary | ICD-10-CM

## 2022-06-12 DIAGNOSIS — R928 Other abnormal and inconclusive findings on diagnostic imaging of breast: Secondary | ICD-10-CM | POA: Diagnosis not present

## 2022-06-15 DIAGNOSIS — K5190 Ulcerative colitis, unspecified, without complications: Secondary | ICD-10-CM | POA: Diagnosis not present

## 2022-06-15 MED ORDER — USTEKINUMAB 90 MG/ML SUBCUTANEOUS SYRINGE
5 refills | 0 days | Status: CP
Start: 2022-06-15 — End: ?

## 2022-06-28 ENCOUNTER — Ambulatory Visit: Payer: Self-pay | Admitting: *Deleted

## 2022-06-28 NOTE — Patient Instructions (Signed)
Visit Information  Thank you for taking time to visit with me today. Please don't hesitate to contact me if I can be of assistance to you.   Following are the goals we discussed today:   Goals Addressed             This Visit's Progress    care coordination activities       Care Coordination Interventions: Case Management program discussed/offered Patient agreeable to participation in the Care Management Program Initial assessment scheduled for 07/05/22           Our next appointment is by telephone on 07/05/22 at 1pm  Please call the care guide team at [phone] if you need to cancel or reschedule your appointment.   If you are experiencing a Mental Health or [place] or need someone to talk to, please call the Suicide and Crisis Lifeline: 988   Patient verbalizes understanding of instructions and care plan provided today and agrees to view in [place]. Active MyChart status and patient understanding of how to access instructions and care plan via MyChart confirmed with patient.     Telephone follow up appointment with care management team member scheduled for: 07/05/22  Elliot Gurney, [place] Worker  [facility] Care Management [phone]

## 2022-06-28 NOTE — Patient Outreach (Signed)
  Care Coordination   Initial Visit Note   06/28/2022 Name: Jihan Mellette MRN: [id number] DOB: 31-Oct-1955  Geniya Fulgham is a 66 y.o. year old female who sees Bacigalupo, [name], MD for primary care. I spoke with  [name] by phone today.  What matters to the patients health and wellness today?  Patient agreeable to Case Management Services-initial appointment scheduled for 06/24/22    Goals Addressed             This Visit's Progress    care coordination activities       Care Coordination Interventions: Case Management program discussed/offered Patient agreeable to participation in the Care Management Program Initial assessment scheduled for 07/05/22           SDOH assessments and interventions completed:  No     Care Coordination Interventions Activated:  Yes  Care Coordination Interventions:  Yes, provided   Follow up plan: Follow up call scheduled for 07/05/22    Encounter Outcome:  Pt. Scheduled

## 2022-07-05 ENCOUNTER — Ambulatory Visit: Payer: Self-pay | Admitting: *Deleted

## 2022-07-05 NOTE — Patient Outreach (Signed)
  Care Coordination   Initial Visit Note   07/05/2022 Name: Dashayla Theissen MRN: [id number] DOB: 1956-05-09  Cecille Mcclusky is a 66 y.o. year old female who sees Bacigalupo, [name], MD for primary care. I spoke with  [name] by phone today.  What matters to the patients health and wellness today?  Caregiver Stress Management    Goals Addressed             This Visit's Progress    managing caregiver stress       Care Coordination Interventions: Patient confirmed being the the primary caregiver of her aging parent Patient confirmed that Palliative care is scheduled to see patient's mother on 10/17, however patient would like additional options for support Per patient, she has care for her family member while she works 3x per week but no assistance outside of those times Caregiver stress acknowleged, emotional support provided Self care emphasized-patient agreeable to referral for respite care through [facility] Referral to be completed for respite care            SDOH assessments and interventions completed:  Yes  SDOH Interventions Today    Flowsheet Row Most Recent Value  SDOH Interventions   Food Insecurity Interventions Intervention Not Indicated  Housing Interventions Intervention Not Indicated  Transportation Interventions Intervention Not Indicated        Care Coordination Interventions Activated:  Yes  Care Coordination Interventions:  Yes, provided   Follow up plan: Follow up call scheduled for 07/19/22    Encounter Outcome:  Pt. Visit Completed

## 2022-07-05 NOTE — Patient Instructions (Signed)
Visit Information  Thank you for taking time to visit with me today. Please don't hesitate to contact me if I can be of assistance to you.   Following are the goals we discussed today:   Goals Addressed             This Visit's Progress    managing caregiver stress       Care Coordination Interventions: Patient confirmed being the the primary caregiver of her aging parent Patient confirmed that Palliative care is scheduled to see patient's mother on 10/17, however patient would like additional options for support Per patient, she has care for her family member while she works 3x per week but no assistance outside of those times Caregiver stress acknowleged, emotional support provided Self care emphasized-patient agreeable to referral for respite care through [facility] Referral to be completed for respite care            Our next appointment is by telephone on 07/19/22 at 10am  Please call the care guide team at [phone] if you need to cancel or reschedule your appointment.   If you are experiencing a Mental Health or [place] or need someone to talk to, please call the Suicide and Crisis Lifeline: 988 call 911   Patient verbalizes understanding of instructions and care plan provided today and agrees to view in [place]. Active MyChart status and patient understanding of how to access instructions and care plan via MyChart confirmed with patient.     Telephone follow up appointment with care management team member scheduled for: 07/19/22  Elliot Gurney, [place] Worker  [facility] Care Management [phone]

## 2022-07-06 ENCOUNTER — Ambulatory Visit: Payer: Self-pay | Admitting: *Deleted

## 2022-07-06 NOTE — Patient Outreach (Signed)
  Care Coordination   Collaboration phone call-  Visit Note   07/06/2022 Name: Karla [name] MRN: [id number] DOB: 18-Jul-1956  Karla [name] is a 66 y.o. year old female who sees Bacigalupo, [name], MD for primary care. I  spoke with [place]  What matters to the patients health and wellness today?  Managing caregiver stress    Goals Addressed             This Visit's Progress    managing caregiver stress       Care Coordination Interventions: Phone call to [place] completed for respite care services [facility] will contact patient to provide patient with additional information regarding wait list and specifics regarding the program            SDOH assessments and interventions completed:  No     Care Coordination Interventions Activated:  Yes  Care Coordination Interventions:  Yes, provided   Follow up plan: Follow up call scheduled for 07/19/22    Encounter Outcome:  Pt. Visit Completed

## 2022-07-19 ENCOUNTER — Encounter: Payer: Self-pay | Admitting: *Deleted

## 2022-07-25 ENCOUNTER — Telehealth: Payer: Self-pay | Admitting: Family Medicine

## 2022-07-25 NOTE — Telephone Encounter (Signed)
Copied from [place] [phone]. Topic: Medicare AWV >> Jul 25, 2022  1:06 PM Jae Dire wrote: Reason for CRM:  Left message for patient to call back and schedule Medicare Annual Wellness Visit (AWV) in office.   If not able to come in the office, please offer to do virtually or by telephone.   No hx of AWV - AWV-I eligible per [facility] as of  04/09/2021  Please schedule at anytime with [facility] Health Advisor.   45 minute appointment  Any questions, please contact me at [phone]

## 2022-08-20 ENCOUNTER — Ambulatory Visit: Payer: Self-pay

## 2022-08-20 ENCOUNTER — Telehealth: Payer: Medicare Other | Admitting: Physician Assistant

## 2022-08-20 DIAGNOSIS — B9689 Other specified bacterial agents as the cause of diseases classified elsewhere: Secondary | ICD-10-CM

## 2022-08-20 DIAGNOSIS — J069 Acute upper respiratory infection, unspecified: Secondary | ICD-10-CM

## 2022-08-20 MED ORDER — AZITHROMYCIN 250 MG PO TABS: ORAL_TABLET | ORAL | 0 refills | Status: AC

## 2022-08-20 NOTE — Telephone Encounter (Signed)
Message from Caprice Red sent at 08/20/2022  9:06 AM EST  Throat is very sore, coughing up mucus is green (started Thurs), covid neg (Fri or Sat), Pt thinks she needs an antibiotic today but not available appts today - tried multiple providers and visit types for office.   Back has been hurting. Took Theraflu yesterday and it helped some. No fever unless low grade. Headache off and on since Thursday.   Chief Complaint: cough Symptoms: green phlegm for cough and nose, mild SOB  Frequency: Thursday Pertinent Negatives: Patient denies fever, sore throat, back pain, runny nose and sinus congestion Disposition: [] ED /[] Urgent Care (no appt availability in office) / [] Appointment(In office/virtual)/ [x]  [facility]/ [] Home Care/ [] Refused Recommended Disposition /[] [place] [facility]/ []  Follow-up with PCP Additional Notes: no available appt today- Scheduled pt with [facility] MyChart Virtual UC Visit  Reason for Disposition  [1] Continuous (nonstop) coughing interferes with work or school AND [2] no improvement using cough treatment per Care Advice  Answer Assessment - Initial Assessment Questions 1. ONSET: "When did the cough begin?"      Thursday 2. SEVERITY: "How bad is the cough today?"      Took OTC Mucinex occasional cough  3. SPUTUM: "Describe the color of your sputum" (none, dry cough; clear, white, yellow, green)     Green  4. HEMOPTYSIS: "Are you coughing up any blood?" If so ask: "How much?" (flecks, streaks, tablespoons, etc.)     N/a 5. DIFFICULTY BREATHING: "Are you having difficulty breathing?" If Yes, ask: "How bad is it?" (e.g., mild, moderate, severe)    - MILD: No SOB at rest, mild SOB with walking, speaks normally in sentences, can lie down, no retractions, pulse < 100.    - MODERATE: SOB at rest, SOB with minimal exertion and prefers to sit, cannot lie down flat, speaks in phrases, mild retractions, audible wheezing, pulse 100-120.    - SEVERE: Very SOB at  rest, speaks in single words, struggling to breathe, sitting hunched forward, retractions, pulse > 120      moderate 6. FEVER: "Do you have a fever?" If Yes, ask: "What is your temperature, how was it measured, and when did it start?"     no 7. CARDIAC HISTORY: "Do you have any history of heart disease?" (e.g., heart attack, congestive heart failure)      no 8. LUNG HISTORY: "Do you have any history of lung disease?"  (e.g., pulmonary embolus, asthma, emphysema)     no 9. PE RISK FACTORS: "Do you have a history of blood clots?" (or: recent major surgery, recent prolonged travel, bedridden)     no 10. OTHER SYMPTOMS: "Do you have any other symptoms?" (e.g., runny nose, wheezing, chest pain)       Sore throat, back pain, runny nose, sinus congestion, green nasal drainage 11. PREGNANCY: "Is there any chance you are pregnant?" "When was your last menstrual period?"       N/a 12. TRAVEL: "Have you traveled out of the country in the last month?" (e.g., travel history, exposures)       N/a  Protocols used: Cough - Acute Productive-A-AH

## 2022-08-20 NOTE — Progress Notes (Signed)
Virtual Visit Consent   Karla [name], you are scheduled for a virtual visit with a [facility] provider today. Just as with appointments in the office, your consent must be obtained to participate. Your consent will be active for this visit and any virtual visit you may have with one of our providers in the next 365 days. If you have a MyChart account, a copy of this consent can be sent to you electronically.  As this is a virtual visit, video technology does not allow for your provider to perform a traditional examination. This may limit your provider's ability to fully assess your condition. If your provider identifies any concerns that need to be evaluated in person or the need to arrange testing (such as labs, EKG, etc.), we will make arrangements to do so. Although advances in technology are sophisticated, we cannot ensure that it will always work on either your end or our end. If the connection with a video visit is poor, the visit may have to be switched to a telephone visit. With either a video or telephone visit, we are not always able to ensure that we have a secure connection.  By engaging in this virtual visit, you consent to the provision of healthcare and authorize for your insurance to be billed (if applicable) for the services provided during this visit. Depending on your insurance coverage, you may receive a charge related to this service.  I need to obtain your verbal consent now. Are you willing to proceed with your visit today? Karla [name] has provided verbal consent on 08/20/2022 for a virtual visit (video or telephone). [name], PA-C  Date: 08/20/2022 1:07 PM  Virtual Visit via Video Note   I, [name], connected with  Karla [name]  ([id number], [birth year]) on 08/20/22 at  1:00 PM EST by a video-enabled telemedicine application and verified that I am speaking with the correct person using two identifiers.  Location: Patient: Virtual Visit Location  Patient: Home Provider: Virtual Visit Location Provider: Home Office   I discussed the limitations of evaluation and management by telemedicine and the availability of in person appointments. The patient expressed understanding and agreed to proceed.    History of Present Illness: Karla [name] is a 66 y.o. who identifies as a female who was assigned female at birth, and is being seen today for cough and congestion.  HPI: Cough This is a new problem. The current episode started in the past 7 days. The problem has been gradually worsening. The problem occurs every few minutes. The cough is Productive of sputum and productive of purulent sputum. Associated symptoms include chills, ear pain (mild), a fever (low grade), headaches, myalgias, nasal congestion, postnasal drip, rhinorrhea, a sore throat and sweats. Pertinent negatives include no ear congestion. The symptoms are aggravated by lying down. Risk factors for lung disease include travel. Treatments tried: theraflu, mucinex, salt water gargles. The treatment provided no relief.   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   History of Clostridium difficile infection 06/03/2012   IBS (irritable bowel syndrome) 06/03/2012   IFG (impaired fasting glucose) 06/03/2012   Memory loss 06/03/2012  Multinodular goiter 06/03/2012   Obstructive sleep apnea 06/03/2012   Stress incontinence in female 06/03/2012   Other osteoporosis without current pathological fracture 06/03/2012    Allergies:  Allergies  Allergen Reactions   Mesalamine     Other reaction(s): Other (See Comments),  Other (See Comments) pancreatitis Pancreatitis Pancreatitis    Prednisone Itching and Palpitations    Other reaction(s): Other (See Comments) Hair loss Alopecia    Amoxicillin Other (See Comments)    Other reaction(s): Unknown unknown unknown unknown    Metronidazole Nausea And Vomiting    Abdominal pain, dysgeusia Abdominal pain, dysgeusia Abdominal pain, dysgeusia    Trazodone And Nefazodone Other (See Comments)    Other reaction(s): Other (See Comments) Severe back pack with muscle aches Severe back pack with muscle aches Severe back pack with muscle aches    Medications:  Current Outpatient Medications:    azithromycin (ZITHROMAX) 250 MG tablet, Take 2 tablets on day 1, then 1 tablet daily on days 2 through 5, Disp: 6 tablet, Rfl: 0   acetaminophen (TYLENOL) 325 MG tablet, Take by mouth., Disp: , Rfl:    buPROPion (WELLBUTRIN XL) 150 MG 24 hr tablet, Take 1 tablet (150 mg total) by mouth daily. Please schedule office visit before any future refill., Disp: 30 tablet, Rfl: 0   citalopram (CELEXA) 40 MG tablet, TAKE 1 TABLET(40 MG) BY MOUTH DAILY, Disp: 90 tablet, Rfl: 1   diphenhydrAMINE (BENADRYL) 25 MG tablet, Take 25 mg by mouth every 6 (six) hours as needed., Disp: , Rfl:    Melatonin 5 MG SUBL, Place 5 mg under the tongue at bedtime as needed., Disp: , Rfl:    omeprazole (PRILOSEC) 20 MG capsule, TAKE 1 CAPSULE(20 MG) BY MOUTH DAILY, Disp: 90 capsule, Rfl: 3   ustekinumab (STELARA) 90 MG/ML SOSY injection, 16m subcutaneous every 8weeks, Disp: , Rfl:   Observations/Objective: Patient is well-developed, well-nourished in no acute distress.  Resting comfortably at home.  Head is normocephalic, atraumatic.  No labored breathing.  Speech is clear and coherent with logical content.  Patient is alert and oriented at baseline.  Frequent, wet cough heard without limiting speech  Assessment and Plan: 1. Bacterial upper respiratory infection - azithromycin (ZITHROMAX)  250 MG tablet; Take 2 tablets on day 1, then 1 tablet daily on days 2 through 5  Dispense: 6 tablet; Refill: 0  - Worsening over a week despite OTC medications - Will treat with Z-pack - Can continue Mucinex  - Push fluids.  - Rest.  - Steam and humidifier can help - Seek in person evaluation if worsening or symptoms fail to improve    Follow Up Instructions: I discussed the assessment and treatment plan with the patient. The patient was provided an opportunity to ask questions and all were answered. The patient agreed with the plan and demonstrated an understanding of the instructions.  A copy of instructions were sent to the patient via MyChart unless otherwise noted below.    The patient was advised to call back or seek an in-person evaluation if the symptoms worsen or if the condition fails to improve as anticipated.  Time:  I spent 12 minutes with the patient via telehealth technology discussing the above problems/concerns.    JMar Daring PA-C

## 2022-08-20 NOTE — Patient Instructions (Signed)
[name], thank you for joining [name], PA-C for today's virtual visit.  While this provider is not your primary care provider (PCP), if your PCP is located in our provider database this encounter information will be shared with them immediately following your visit.   [facility] account gives you access to today's visit and all your visits, tests, and labs performed at [facility] " click here if you don't have a [facility] account or go to [email or web address]  Consent: (Patient) Karla [name] provided verbal consent for this virtual visit at the beginning of the encounter.  Current Medications:  Current Outpatient Medications:    azithromycin (ZITHROMAX) 250 MG tablet, Take 2 tablets on day 1, then 1 tablet daily on days 2 through 5, Disp: 6 tablet, Rfl: 0   acetaminophen (TYLENOL) 325 MG tablet, Take by mouth., Disp: , Rfl:    buPROPion (WELLBUTRIN XL) 150 MG 24 hr tablet, Take 1 tablet (150 mg total) by mouth daily. Please schedule office visit before any future refill., Disp: 30 tablet, Rfl: 0   citalopram (CELEXA) 40 MG tablet, TAKE 1 TABLET(40 MG) BY MOUTH DAILY, Disp: 90 tablet, Rfl: 1   diphenhydrAMINE (BENADRYL) 25 MG tablet, Take 25 mg by mouth every 6 (six) hours as needed., Disp: , Rfl:    Melatonin 5 MG SUBL, Place 5 mg under the tongue at bedtime as needed., Disp: , Rfl:    omeprazole (PRILOSEC) 20 MG capsule, TAKE 1 CAPSULE(20 MG) BY MOUTH DAILY, Disp: 90 capsule, Rfl: 3   ustekinumab (STELARA) 90 MG/ML SOSY injection, 80m subcutaneous every 8weeks, Disp: , Rfl:    Medications ordered in this encounter:  Meds ordered this encounter  Medications   azithromycin (ZITHROMAX) 250 MG tablet    Sig: Take 2 tablets on day 1, then 1 tablet daily on days 2 through 5    Dispense:  6 tablet    Refill:  0    Order Specific Question:   Supervising Provider    Answer:   LChase Picket[[id number]    *If you need refills on  other medications prior to your next appointment, please contact your pharmacy*  Follow-Up: Call back or seek an in-person evaluation if the symptoms worsen or if the condition fails to improve as anticipated.  [phone] Other Instructions  Acute Bronchitis, Adult  Acute bronchitis is sudden inflammation of the main airways (bronchi) that come off the windpipe (trachea) in the lungs. The swelling causes the airways to get smaller and make more mucus than normal. This can make it hard to breathe and can cause coughing or noisy breathing (wheezing). Acute bronchitis may last several weeks. The cough may last longer. Allergies, asthma, and exposure to smoke may make the condition worse. What are the causes? This condition can be caused by germs and by substances that irritate the lungs, including: Cold and flu viruses. The most common cause of this condition is the virus that causes the common cold. Bacteria. This is less common. Breathing in substances that irritate the lungs, including: Smoke from cigarettes and other forms of tobacco. Dust and pollen. Fumes from household cleaning products, gases, or burned fuel. Indoor or outdoor air pollution. What increases the risk? The following factors may make you more likely to develop this condition: A weak body's defense system, also called the immune system. A condition that affects your lungs and breathing, such as asthma. What are the signs or  symptoms? Common symptoms of this condition include: Coughing. This may bring up clear, yellow, or green mucus from your lungs (sputum). Wheezing. Runny or stuffy nose. Having too much mucus in your lungs (chest congestion). Shortness of breath. Aches and pains, including sore throat or chest. How is this diagnosed? This condition is usually diagnosed based on: Your symptoms and medical history. A physical exam. You may also have other tests, including tests to  rule out other conditions, such as pneumonia. These tests include: A test of lung function. Test of a mucus sample to look for the presence of bacteria. Tests to check the oxygen level in your blood. Blood tests. Chest X-ray. How is this treated? Most cases of acute bronchitis clear up over time without treatment. Your health care provider may recommend: Drinking more fluids to help thin your mucus so it is easier to cough up. Taking inhaled medicine (inhaler) to improve air flow in and out of your lungs. Using a vaporizer or a humidifier. These are machines that add water to the air to help you breathe better. Taking a medicine that thins mucus and clears congestion (expectorant). Taking a medicine that prevents or stops coughing (cough suppressant). It is not common to take an antibiotic medicine for this condition. Follow these instructions at home:  Take over-the-counter and prescription medicines only as told by your health care provider. Use an inhaler, vaporizer, or humidifier as told by your health care provider. Take two teaspoons (10 mL) of honey at bedtime to lessen coughing at night. Drink enough fluid to keep your urine pale yellow. Do not use any products that contain nicotine or tobacco. These products include cigarettes, chewing tobacco, and vaping devices, such as e-cigarettes. If you need help quitting, ask your health care provider. Get plenty of rest. Return to your normal activities as told by your health care provider. Ask your health care provider what activities are safe for you. Keep all follow-up visits. This is important. How is this prevented? To lower your risk of getting this condition again: Wash your hands often with soap and water for at least 20 seconds. If soap and water are not available, use hand sanitizer. Avoid contact with people who have cold symptoms. Try not to touch your mouth, nose, or eyes with your hands. Avoid breathing in smoke or chemical  fumes. Breathing smoke or chemical fumes will make your condition worse. Get the flu shot every year. Contact a health care provider if: Your symptoms do not improve after 2 weeks. You have trouble coughing up the mucus. Your cough keeps you awake at night. You have a fever. Get help right away if you: Cough up blood. Feel pain in your chest. Have severe shortness of breath. Faint or keep feeling like you are going to faint. Have a severe headache. Have a fever or chills that get worse. These symptoms may represent a serious problem that is an emergency. Do not wait to see if the symptoms will go away. Get medical help right away. Call your local emergency services (911 in the [place] Do not drive yourself to the hospital. Summary Acute bronchitis is inflammation of the main airways (bronchi) that come off the windpipe (trachea) in the lungs. The swelling causes the airways to get smaller and make more mucus than normal. Drinking more fluids can help thin your mucus so it is easier to cough up. Take over-the-counter and prescription medicines only as told by your health care provider. Do not use any products  that contain nicotine or tobacco. These products include cigarettes, chewing tobacco, and vaping devices, such as e-cigarettes. If you need help quitting, ask your health care provider. Contact a health care provider if your symptoms do not improve after 2 weeks. This information is not intended to replace advice given to you by your health care provider. Make sure you discuss any questions you have with your health care provider. Document Revised: 12/21/2021 Document Reviewed: 01/11/2021 Elsevier Patient Education  [place].    If you have been instructed to have an in-person evaluation today at a local Urgent Care facility, please use the link below. It will take you to a list of all of our available [facility], including address, phone number and hours of  operation. Please do not delay care.  [facility]  If you or a family member do not have a primary care provider, use the link below to schedule a visit and establish care. When you choose a [facility] primary care physician or advanced practice provider, you gain a long-term partner in health. Find a Primary Care Provider  Learn more about [facility]'s in-office and virtual care options: [facility]

## 2022-08-23 ENCOUNTER — Encounter: Payer: Self-pay | Admitting: Physician Assistant

## 2022-08-23 ENCOUNTER — Ambulatory Visit
Admission: RE | Admit: 2022-08-23 | Discharge: 2022-08-23 | Disposition: A | Discharge status: Home / Self Care | Payer: Medicare Other | Attending: Physician Assistant | Admitting: Physician Assistant

## 2022-08-23 ENCOUNTER — Ambulatory Visit (INDEPENDENT_AMBULATORY_CARE_PROVIDER_SITE_OTHER): Payer: Medicare Other | Admitting: Physician Assistant

## 2022-08-23 ENCOUNTER — Ambulatory Visit
Admission: RE | Admit: 2022-08-23 | Discharge: 2022-08-23 | Disposition: A | Discharge status: Home / Self Care | Payer: Medicare Other | Source: Ambulatory Visit | Attending: Physician Assistant | Admitting: Physician Assistant

## 2022-08-23 VITALS — BP 102/70 | HR 73 | Temp 98.2°F | Resp 16 | Wt 157.0 lb

## 2022-08-23 DIAGNOSIS — R0981 Nasal congestion: Secondary | ICD-10-CM

## 2022-08-23 DIAGNOSIS — R0989 Other specified symptoms and signs involving the circulatory and respiratory systems: Secondary | ICD-10-CM | POA: Diagnosis not present

## 2022-08-23 DIAGNOSIS — R509 Fever, unspecified: Secondary | ICD-10-CM | POA: Diagnosis not present

## 2022-08-23 DIAGNOSIS — R059 Cough, unspecified: Secondary | ICD-10-CM | POA: Diagnosis not present

## 2022-08-23 DIAGNOSIS — K449 Diaphragmatic hernia without obstruction or gangrene: Secondary | ICD-10-CM | POA: Diagnosis not present

## 2022-08-23 LAB — POCT INFLUENZA A/B
Influenza A, POC: NEGATIVE
Influenza B, POC: NEGATIVE

## 2022-08-23 LAB — POC COVID19 BINAXNOW: SARS Coronavirus 2 Ag: NEGATIVE

## 2022-08-23 MED ORDER — AZELASTINE HCL 0.1 % NA SOLN: 1.0000 | Freq: Two times a day (BID) | NASAL | 12 refills | Status: DC

## 2022-08-23 NOTE — Progress Notes (Signed)
I,Sha'taria Tyson,acting as a Education administrator for [employer], PA-C.,have documented all relevant documentation on the behalf of Karla Speak, PA-C,as directed by  [employer], PA-C while in the presence of [employer], PA-C.   Established patient visit   Patient: Karla [name]   DOB: 1956/04/19   66 y.o. Female  MRN: [id number] Visit Date: 08/23/2022  Today's healthcare provider: [name], PA-C   No chief complaint on file.  Subjective    URI  This is a new problem. The current episode started 1 to 4 weeks ago. The problem has been rapidly worsening. Associated symptoms include congestion, ear pain (mild, discomfort>pain), headaches, rhinorrhea and a sore throat.    Per chart review, had a virtual visit with a provider. Covid 19 testing at home was negative.  Patient was diagnosed with bacterial upper respiratory infection, was prescribed azithromycin, Mucinex, pushing fluids, resting, air humidifier, and steaming Traveled to [place] recently for her mother's funeral  Medications: Outpatient Medications Prior to Visit  Medication Sig   acetaminophen (TYLENOL) 325 MG tablet Take by mouth.   azithromycin (ZITHROMAX) 250 MG tablet Take 2 tablets on day 1, then 1 tablet daily on days 2 through 5   buPROPion (WELLBUTRIN XL) 150 MG 24 hr tablet Take 1 tablet (150 mg total) by mouth daily. Please schedule office visit before any future refill.   citalopram (CELEXA) 40 MG tablet TAKE 1 TABLET(40 MG) BY MOUTH DAILY   diphenhydrAMINE (BENADRYL) 25 MG tablet Take 25 mg by mouth every 6 (six) hours as needed.   Melatonin 5 MG SUBL Place 5 mg under the tongue at bedtime as needed.   omeprazole (PRILOSEC) 20 MG capsule TAKE 1 CAPSULE(20 MG) BY MOUTH DAILY   ustekinumab (STELARA) 90 MG/ML SOSY injection 23m subcutaneous every 8weeks   No facility-administered medications prior to visit.    Review of Systems  Constitutional:  Positive for fever.  HENT:  Positive for congestion, ear  pain (mild, discomfort>pain), postnasal drip, rhinorrhea and sore throat.   Eyes:  Eye redness: mild.       Dry eyes  Neurological:  Positive for headaches.       Objective    There were no vitals taken for this visit.   Physical Exam Vitals reviewed.  Constitutional:      General: She is not in acute distress.    Appearance: Normal appearance. She is well-developed. She is not diaphoretic.  HENT:     Head: Normocephalic and atraumatic.     Right Ear: Ear canal and external ear normal. There is impacted cerumen (partially).     Left Ear: Ear canal and external ear normal. There is impacted cerumen (partially).     Nose: Congestion and rhinorrhea present.     Mouth/Throat:     Pharynx: Posterior oropharyngeal erythema (mild) present.     Comments: Postnasal drainage  Eyes:     General: No scleral icterus.       Right eye: No discharge.        Left eye: No discharge.     Extraocular Movements: Extraocular movements intact.     Conjunctiva/sclera: Conjunctivae normal.     Pupils: Pupils are equal, round, and reactive to light.     Comments: Slightly injected conjunctiva bilaterally  Neck:     Thyroid: No thyromegaly.  Cardiovascular:     Rate and Rhythm: Normal rate and regular rhythm.     Pulses: Normal pulses.     Heart sounds: Normal heart sounds.  No murmur heard. Pulmonary:     Effort: Pulmonary effort is normal. No respiratory distress.     Breath sounds: Normal breath sounds. No wheezing, rhonchi or rales.  Musculoskeletal:        General: Normal range of motion.     Cervical back: Normal range of motion and neck supple.     Right lower leg: No edema.     Left lower leg: No edema.  Lymphadenopathy:     Cervical: No cervical adenopathy.  Skin:    General: Skin is warm and dry.     Findings: No rash.  Neurological:     Mental Status: She is alert and oriented to person, place, and time. Mental status is at baseline.  Psychiatric:        Behavior: Behavior  normal.        Thought Content: Thought content normal.        Judgment: Judgment normal.     No results found for any visits on 08/23/22.  Assessment & Plan     1. Nasal congestion X 10 days Vitals normal. Lung sounds clear except right lower lung field.  Could be due hiatal hernia In process of completing a course of abx/azithromycin, her 4th day Symptomatic treatment advised including OTC antihistamines, nasal saline rinse, air humidifier, adequate hydration, resting, tensing, or OTC Mucinex. Pt had an/allergy/ contraindication to use prednisone - azelastine (ASTELIN) 0.1 % nasal spray; Place 1 spray into both nostrils 2 (two) times daily. Use in each nostril as directed  Dispense: 30 mL; Refill: 12 - DG Chest 2 View; Future to rule out pneumonia, bronchitis, hiatal hernia - POC COVID-19 negative - POCT Influenza A/B negative  2. Symptoms of upper respiratory infection (URI) X 10 days Continue azithromycin 250 mg Continue symptomatic treatment, see above Advised warm salt gargle for sore throat, OTC eyedrops for dry eyes - azelastine (ASTELIN) 0.1 % nasal spray; Place 1 spray into both nostrils 2 (two) times daily. Use in each nostril as directed  Dispense: 30 mL; Refill: 12 - DG Chest 2 View; Future - POC COVID-19 - POCT Influenza A/B Will follow-up after receiving imaging results  3. Cough, unspecified type X 10 days Continue to take omeprazole 20 mg Continue symptomatic treatment including Mucinex, Vicks VapoRub, hot tea with honey - azelastine (ASTELIN) 0.1 % nasal spray; Place 1 spray into both nostrils 2 (two) times daily. Use in each nostril as directed  Dispense: 30 mL; Refill: 12  - DG Chest 2 View; Future - POC COVID-19 neg - POCT Influenza A/B neg  Discussed with patient that cough might last up to 2 months The patient was advised to call back or seek an in-person evaluation if the symptoms worsen or if the condition fails to improve as anticipated.  I  discussed the assessment and treatment plan with the patient. The patient was provided an opportunity to ask questions and all were answered. The patient agreed with the plan and demonstrated an understanding of the instructions.  The entirety of the information documented in the History of Present Illness, Review of Systems and Physical Exam were personally obtained by me. Portions of this information were initially documented by the CMA and reviewed by me for thoroughness and accuracy.   Karla [name], [facility], [place] [phone] (phone) [phone] (fax)

## 2022-08-24 ENCOUNTER — Encounter: Payer: Self-pay | Admitting: Physician Assistant

## 2022-08-24 ENCOUNTER — Ambulatory Visit: Payer: Medicare Other | Admitting: Physician Assistant

## 2022-08-29 DIAGNOSIS — H16142 Punctate keratitis, left eye: Secondary | ICD-10-CM | POA: Diagnosis not present

## 2022-08-30 ENCOUNTER — Ambulatory Visit (INDEPENDENT_AMBULATORY_CARE_PROVIDER_SITE_OTHER): Payer: Medicare Other

## 2022-08-30 VITALS — Ht 63.0 in | Wt 157.0 lb

## 2022-08-30 DIAGNOSIS — Z0000 Encounter for general adult medical examination without abnormal findings: Secondary | ICD-10-CM | POA: Diagnosis not present

## 2022-08-30 DIAGNOSIS — Z780 Asymptomatic menopausal state: Secondary | ICD-10-CM

## 2022-08-30 NOTE — Progress Notes (Signed)
Virtual Visit via Telephone Note  I connected with  Karla [name] on 08/30/22 at  1:30 PM EST by telephone and verified that I am speaking with the correct person using two identifiers.  Location: Patient: home Provider: [facility] Persons participating in the virtual visit: [place]   I discussed the limitations, risks, security and privacy concerns of performing an evaluation and management service by telephone and the availability of in person appointments. The patient expressed understanding and agreed to proceed.  Interactive audio and video telecommunications were attempted between this nurse and patient, however failed, due to patient having technical difficulties OR patient did not have access to video capability.  We continued and completed visit with audio only.  Some vital signs may be absent or patient reported.   Dionisio David, LPN  Subjective:   Karla [name] is a 66 y.o. female who presents for Medicare Annual (Subsequent) preventive examination.  Review of Systems     Cardiac Risk Factors include: advanced age (>61mn, >>36women)     Objective:    There were no vitals filed for this visit. There is no height or weight on file to calculate BMI.     08/30/2022    1:39 PM 03/31/2020    3:04 PM 03/27/2020    6:27 PM 05/05/2018   12:58 PM  Advanced Directives  Does Patient Have a Medical Advance Directive? No No No No  Would patient like information on creating a medical advance directive? No - Patient declined No - Patient declined      Current Medications (verified) Outpatient Encounter Medications as of 08/30/2022  Medication Sig   acetaminophen (TYLENOL) 325 MG tablet Take by mouth.   citalopram (CELEXA) 40 MG tablet TAKE 1 TABLET(40 MG) BY MOUTH DAILY   diphenhydrAMINE (BENADRYL) 25 MG tablet Take 25 mg by mouth every 6 (six) hours as needed.   Melatonin 5 MG SUBL Place 5 mg under the tongue at bedtime as needed.   omeprazole (PRILOSEC) 20 MG  capsule TAKE 1 CAPSULE(20 MG) BY MOUTH DAILY   ustekinumab (STELARA) 90 MG/ML SOSY injection 971msubcutaneous every 8weeks   azelastine (ASTELIN) 0.1 % nasal spray Place 1 spray into both nostrils 2 (two) times daily. Use in each nostril as directed (Patient not taking: Reported on 08/30/2022)   No facility-administered encounter medications on file as of 08/30/2022.    Allergies (verified) Mesalamine, Prednisone, Amoxicillin, Metronidazole, and Trazodone and nefazodone   History: Past Medical History:  Diagnosis Date   GERD (gastroesophageal reflux disease)    Insomnia    Restless leg syndrome    Ulcerative colitis ([facility]   Vertigo    Past Surgical History:  Procedure Laterality Date   EXTRACORPOREAL SHOCK WAVE LITHOTRIPSY Left 03/31/2020   Procedure: EXTRACORPOREAL SHOCK WAVE LITHOTRIPSY (ESWL);  Surgeon: BrHollice EspyMD;  Location: [facility] ORS;  Service: Urology;  Laterality: Left;   NECK SURGERY     SHOULDER SURGERY Left    Family History  Problem Relation Age of Onset   Congestive Heart Failure Mother    COPD Mother    Macular degeneration Mother    Vitiligo Mother    Arthritis Father    Diabetes Father    Breast cancer Paternal Grandmother    Social History   Socioeconomic History   Marital status: Single    Spouse name: Not on file   Number of children: Not on file   Years of education: Not on file   Highest education level: Not  on file  Occupational History   Not on file  Tobacco Use   Smoking status: Never   Smokeless tobacco: Never  Vaping Use   Vaping Use: Never used  Substance and Sexual Activity   Alcohol use: Never   Drug use: Never   Sexual activity: Not Currently  Other Topics Concern   Not on file  Social History Narrative   Not on file   Social Determinants of Health   Financial Resource Strain: Low Risk  (08/30/2022)   Overall Financial Resource Strain (CARDIA)    Difficulty of Paying Living Expenses: Not hard at all  Food Insecurity:  No Food Insecurity (08/30/2022)   Hunger Vital Sign    Worried About Running Out of Food in the Last Year: Never true    [place] in the Last Year: Never true  Transportation Needs: No Transportation Needs (08/30/2022)   PRAPARE - Hydrologist (Medical): No    Lack of Transportation (Non-Medical): No  Physical Activity: Insufficiently Active (08/30/2022)   Exercise Vital Sign    Days of Exercise per Week: 4 days    Minutes of Exercise per Session: 30 min  Stress: Stress Concern Present (08/30/2022)   [place]    Feeling of Stress : To some extent  Social Connections: Socially Isolated (08/30/2022)   Social Connection and Isolation Panel [NHANES]    Frequency of Communication with Friends and Family: More than three times a week    Frequency of Social Gatherings with Friends and Family: Three times a week    Attends Religious Services: Never    Active Member of Clubs or Organizations: No    Attends Music therapist: Never    Marital Status: Divorced    Tobacco Counseling Counseling given: Not Answered   Clinical Intake:  Pre-visit preparation completed: Yes  Pain : No/denies pain     Nutritional Risks: None Diabetes: No  How often do you need to have someone help you when you read instructions, pamphlets, or other written materials from your doctor or pharmacy?: 1 - Never  Diabetic?no  Interpreter Needed?: No  Information entered by :: Kirke Shaggy, LPN   Activities of Daily Living    08/30/2022    1:40 PM 08/26/2022    5:13 PM  In your present state of health, do you have any difficulty performing the following activities:  Hearing? 0 0  Vision? 0 0  Difficulty concentrating or making decisions? 1 1  Walking or climbing stairs? 0 0  Dressing or bathing? 0 0  Doing errands, shopping? 0 0  Preparing Food and eating ? N N  Using the Toilet? N N  In  the past six months, have you accidently leaked urine? N N  Do you have problems with loss of bowel control? Y Y  Managing your Medications? N N  Managing your Finances? Tempie Donning  Housekeeping or managing your Housekeeping? N N    Patient Care Team: Virginia Crews, MD as PCP - General (Family Medicine)  Indicate any recent Medical Services you may have received from other than [facility] providers in the past year (date may be approximate).     Assessment:   This is a routine wellness examination for [place].  Hearing/Vision screen Hearing Screening - Comments:: No aids Vision Screening - Comments:: Wears glasses- Dr.Woodard  Dietary issues and exercise activities discussed: Current Exercise Habits: Home exercise routine, Type of  exercise: walking, Time (Minutes): 30, Frequency (Times/Week): 5, Weekly Exercise (Minutes/Week): 150, Intensity: Mild   Goals Addressed             This Visit's Progress    DIET - EAT MORE FRUITS AND VEGETABLES         Depression Screen    08/30/2022    1:38 PM 07/05/2022    1:25 PM 01/23/2022    4:20 PM 07/25/2021    9:15 AM 03/23/2021    3:24 PM 04/08/2020   11:29 AM 02/04/2020   10:48 AM  PHQ 2/9 Scores  PHQ - 2 Score 3 1 2 1 1 2  0  PHQ- 9 Score 5  5 5 8 6  0    Fall Risk    08/30/2022    1:40 PM 08/26/2022    5:13 PM 01/23/2022    4:19 PM 07/25/2021    9:14 AM 03/23/2021    3:24 PM  Fall Risk   Falls in the past year? 0 0 1 1 0  Number falls in past yr: 0 0 0 0 0  Injury with Fall? 0 0 1 1 0  Risk for fall due to : No Fall Risks   History of fall(s)   Follow up Falls prevention discussed;Falls evaluation completed  Falls evaluation completed  Falls evaluation completed    FALL RISK PREVENTION PERTAINING TO THE HOME:  Any stairs in or around the home? No  If so, are there any without handrails? No  Home free of loose throw rugs in walkways, pet beds, electrical cords, etc? Yes  Adequate lighting in your home to reduce risk of falls? Yes    ASSISTIVE DEVICES UTILIZED TO PREVENT FALLS:  Life alert? No  Use of a cane, walker or w/c? No  Grab bars in the bathroom? Yes  Shower chair or bench in shower? Yes  Elevated toilet seat or a handicapped toilet? Yes    Cognitive Function:        08/30/2022    1:42 PM 04/08/2020   11:30 AM  6CIT Screen  What Year? 0 points 0 points  What month? 0 points 0 points  What time? 0 points 0 points  Count back from 20 0 points 0 points  Months in reverse 0 points 0 points  Repeat phrase 0 points 4 points  Total Score 0 points 4 points    Immunizations Immunization History  Administered Date(s) Administered   Influenza Split 08/11/2012   Influenza,inj,Quad PF,6+ Mos 06/06/2018   Influenza,trivalent, recombinat, inj, PF 08/11/2012   PPD Test 05/16/2018   Pneumococcal Conjugate-13 08/28/2018   Pneumococcal Polysaccharide-23 04/08/2020   Tdap 05/23/2010    TDAP status: Due, Education has been provided regarding the importance of this vaccine. Advised may receive this vaccine at local pharmacy or Health Dept. Aware to provide a copy of the vaccination record if obtained from local pharmacy or Health Dept. Verbalized acceptance and understanding.  Flu Vaccine status: Declined, Education has been provided regarding the importance of this vaccine but patient still declined. Advised may receive this vaccine at local pharmacy or Health Dept. Aware to provide a copy of the vaccination record if obtained from local pharmacy or Health Dept. Verbalized acceptance and understanding.  Pneumococcal vaccine status: Up to date  Covid-19 vaccine status: Declined, Education has been provided regarding the importance of this vaccine but patient still declined. Advised may receive this vaccine at local pharmacy or Health Dept.or vaccine clinic. Aware to provide a copy of the vaccination  record if obtained from local pharmacy or Health Dept. Verbalized acceptance and understanding.  Qualifies for  Shingles Vaccine? Yes   Zostavax completed No   Shingrix Completed?: No.    Education has been provided regarding the importance of this vaccine. Patient has been advised to call insurance company to determine out of pocket expense if they have not yet received this vaccine. Advised may also receive vaccine at local pharmacy or Health Dept. Verbalized acceptance and understanding.  Screening Tests Health Maintenance  Topic Date Due   COVID-19 Vaccine (1) Never done   Zoster Vaccines- Shingrix (1 of 2) Never done   DTaP/Tdap/Td (2 - Td or Tdap) 05/23/2020   INFLUENZA VACCINE  04/24/2022   MAMMOGRAM  04/27/2023   Medicare Annual Wellness (AWV)  08/31/2023   COLONOSCOPY (Pts 45-4yr Insurance coverage will need to be confirmed)  11/25/2028   Pneumonia Vaccine 66 Years old  Completed   DEXA SCAN  Completed   Hepatitis C Screening  Completed   HPV VACCINES  Aged Out    Health Maintenance  Health Maintenance Due  Topic Date Due   COVID-19 Vaccine (1) Never done   Zoster Vaccines- Shingrix (1 of 2) Never done   DTaP/Tdap/Td (2 - Td or Tdap) 05/23/2020   INFLUENZA VACCINE  04/24/2022    Colorectal cancer screening: Type of screening: Colonoscopy. Completed 11/26/18. Repeat every 10 years  Mammogram status: Completed 06/12/22. Repeat every year  Bone Density status: Completed 03/24/19. Results reflect: Bone density results: OSTEOPOROSIS. Repeat every 2 years.  Lung Cancer Screening: (Low Dose CT Chest recommended if Age 68-80years, 30 pack-year currently smoking OR have quit w/in 15years.) does not qualify.   Additional Screening:  Hepatitis C Screening: does qualify; Completed 03/23/21  Vision Screening: Recommended annual ophthalmology exams for early detection of glaucoma and other disorders of the eye. Is the patient up to date with their annual eye exam?  Yes  Who is the provider or what is the name of the office in which the patient attends annual eye exams? Dr.Woodard If pt  is not established with a provider, would they like to be referred to a provider to establish care? No .   Dental Screening: Recommended annual dental exams for proper oral hygiene  Community Resource Referral / Chronic Care Management: CRR required this visit?  No   CCM required this visit?  No      Plan:     I have personally reviewed and noted the following in the patient's chart:   Medical and social history Use of alcohol, tobacco or illicit drugs  Current medications and supplements including opioid prescriptions. Patient is not currently taking opioid prescriptions. Functional ability and status Nutritional status Physical activity Advanced directives List of other physicians Hospitalizations, surgeries, and ER visits in previous 12 months Vitals Screenings to include cognitive, depression, and falls Referrals and appointments  In addition, I have reviewed and discussed with patient certain preventive protocols, quality metrics, and best practice recommendations. A written personalized care plan for preventive services as well as general preventive health recommendations were provided to patient.     LDionisio David LPN   [id number]  Nurse Notes: none

## 2022-08-30 NOTE — Patient Instructions (Signed)
Ms. Bartelson , Thank you for taking time to come for your Medicare Wellness Visit. I appreciate your ongoing commitment to your health goals. Please review the following plan we discussed and let me know if I can assist you in the future.   Screening recommendations/referrals: Colonoscopy: 11/26/18, every 10 years Mammogram: 06/12/22, every year Bone Density: 03/24/19, every 2 years, referral sent Recommended yearly ophthalmology/optometry visit for glaucoma screening and checkup Recommended yearly dental visit for hygiene and checkup  Vaccinations: Influenza vaccine: n/d Pneumococcal vaccine: 04/08/20 Tdap vaccine: 05/23/10, due if have injury Shingles vaccine: n/d   Covid-19:n/d  Advanced directives: no  Conditions/risks identified: none  Next appointment: Follow up in one year for your annual wellness visit 09/03/23 @ 1:30 pm by phone   Preventive Care 65 Years and Older, Female Preventive care refers to lifestyle choices and visits with your health care provider that can promote health and wellness. What does preventive care include? A yearly physical exam. This is also called an annual well check. Dental exams once or twice a year. Routine eye exams. Ask your health care provider how often you should have your eyes checked. Personal lifestyle choices, including: Daily care of your teeth and gums. Regular physical activity. Eating a healthy diet. Avoiding tobacco and drug use. Limiting alcohol use. Practicing safe sex. Taking low-dose aspirin every day. Taking vitamin and mineral supplements as recommended by your health care provider. What happens during an annual well check? The services and screenings done by your health care provider during your annual well check will depend on your age, overall health, lifestyle risk factors, and family history of disease. Counseling  Your health care provider may ask you questions about your: Alcohol use. Tobacco use. Drug  use. Emotional well-being. Home and relationship well-being. Sexual activity. Eating habits. History of falls. Memory and ability to understand (cognition). Work and work Statistician. Reproductive health. Screening  You may have the following tests or measurements: Height, weight, and BMI. Blood pressure. Lipid and cholesterol levels. These may be checked every 5 years, or more frequently if you are over 68 years old. Skin check. Lung cancer screening. You may have this screening every year starting at age 46 if you have a 30-pack-year history of smoking and currently smoke or have quit within the past 15 years. Fecal occult blood test (FOBT) of the stool. You may have this test every year starting at age 41. Flexible sigmoidoscopy or colonoscopy. You may have a sigmoidoscopy every 5 years or a colonoscopy every 10 years starting at age 32. Hepatitis C blood test. Hepatitis B blood test. Sexually transmitted disease (STD) testing. Diabetes screening. This is done by checking your blood sugar (glucose) after you have not eaten for a while (fasting). You may have this done every 1-3 years. Bone density scan. This is done to screen for osteoporosis. You may have this done starting at age 57. Mammogram. This may be done every 1-2 years. Talk to your health care provider about how often you should have regular mammograms. Talk with your health care provider about your test results, treatment options, and if necessary, the need for more tests. Vaccines  Your health care provider may recommend certain vaccines, such as: Influenza vaccine. This is recommended every year. Tetanus, diphtheria, and acellular pertussis (Tdap, Td) vaccine. You may need a Td booster every 10 years. Zoster vaccine. You may need this after age 36. Pneumococcal 13-valent conjugate (PCV13) vaccine. One dose is recommended after age 20. Pneumococcal polysaccharide (PPSV23) vaccine.  One dose is recommended after age  9. Talk to your health care provider about which screenings and vaccines you need and how often you need them. This information is not intended to replace advice given to you by your health care provider. Make sure you discuss any questions you have with your health care provider. Document Released: 10/07/2015 Document Revised: 05/30/2016 Document Reviewed: 07/12/2015 Elsevier Interactive Patient Education  2017 [place] Prevention in the Home Falls can cause injuries. They can happen to people of all ages. There are many things you can do to make your home safe and to help prevent falls. What can I do on the outside of my home? Regularly fix the edges of walkways and driveways and fix any cracks. Remove anything that might make you trip as you walk through a door, such as a raised step or threshold. Trim any bushes or trees on the path to your home. Use bright outdoor lighting. Clear any walking paths of anything that might make someone trip, such as rocks or tools. Regularly check to see if handrails are loose or broken. Make sure that both sides of any steps have handrails. Any raised decks and porches should have guardrails on the edges. Have any leaves, snow, or ice cleared regularly. Use sand or salt on walking paths during winter. Clean up any spills in your garage right away. This includes oil or grease spills. What can I do in the bathroom? Use night lights. Install grab bars by the toilet and in the tub and shower. Do not use towel bars as grab bars. Use non-skid mats or decals in the tub or shower. If you need to sit down in the shower, use a plastic, non-slip stool. Keep the floor dry. Clean up any water that spills on the floor as soon as it happens. Remove soap buildup in the tub or shower regularly. Attach bath mats securely with double-sided non-slip rug tape. Do not have throw rugs and other things on the floor that can make you trip. What can I do in the  bedroom? Use night lights. Make sure that you have a light by your bed that is easy to reach. Do not use any sheets or blankets that are too big for your bed. They should not hang down onto the floor. Have a firm chair that has side arms. You can use this for support while you get dressed. Do not have throw rugs and other things on the floor that can make you trip. What can I do in the kitchen? Clean up any spills right away. Avoid walking on wet floors. Keep items that you use a lot in easy-to-reach places. If you need to reach something above you, use a strong step stool that has a grab bar. Keep electrical cords out of the way. Do not use floor polish or wax that makes floors slippery. If you must use wax, use non-skid floor wax. Do not have throw rugs and other things on the floor that can make you trip. What can I do with my stairs? Do not leave any items on the stairs. Make sure that there are handrails on both sides of the stairs and use them. Fix handrails that are broken or loose. Make sure that handrails are as long as the stairways. Check any carpeting to make sure that it is firmly attached to the stairs. Fix any carpet that is loose or worn. Avoid having throw rugs at the top or bottom of  the stairs. If you do have throw rugs, attach them to the floor with carpet tape. Make sure that you have a light switch at the top of the stairs and the bottom of the stairs. If you do not have them, ask someone to add them for you. What else can I do to help prevent falls? Wear shoes that: Do not have high heels. Have rubber bottoms. Are comfortable and fit you well. Are closed at the toe. Do not wear sandals. If you use a stepladder: Make sure that it is fully opened. Do not climb a closed stepladder. Make sure that both sides of the stepladder are locked into place. Ask someone to hold it for you, if possible. Clearly mark and make sure that you can see: Any grab bars or  handrails. First and last steps. Where the edge of each step is. Use tools that help you move around (mobility aids) if they are needed. These include: Canes. Walkers. Scooters. Crutches. Turn on the lights when you go into a dark area. Replace any light bulbs as soon as they burn out. Set up your furniture so you have a clear path. Avoid moving your furniture around. If any of your floors are uneven, fix them. If there are any pets around you, be aware of where they are. Review your medicines with your doctor. Some medicines can make you feel dizzy. This can increase your chance of falling. Ask your doctor what other things that you can do to help prevent falls. This information is not intended to replace advice given to you by your health care provider. Make sure you discuss any questions you have with your health care provider. Document Released: 07/07/2009 Document Revised: 02/16/2016 Document Reviewed: 10/15/2014 Elsevier Interactive Patient Education  2017 [employer].

## 2022-10-16 ENCOUNTER — Telehealth: Payer: Self-pay | Admitting: *Deleted

## 2022-10-16 NOTE — Patient Outreach (Signed)
  Care Coordination   Follow Up Visit Note   10/16/2022 Name: Karla [name] MRN: [id number] DOB: [birth year]  Karla [name] is a 67 y.o. year old female who sees Bacigalupo, [name], MD for primary care. I spoke with  Karla [name] by phone today.  What matters to the patients health and wellness today?  Patient originally referred for caregiver stress. Follow up phone call to assess for additional needs. Patient confirmed that her mother has passed away     Goals Addressed             This Visit's Progress    managing caregiver stress       Care Coordination Interventions: Follow up with patient related to respite care services  Patient confirmed that her mother did go to respite care, however became ill and was transferred over to the hospice home and passed away Active listening / Reflection utilized  Emotional Support Provided Consideration of grief counseling encouraged-patient aware of this option and will continue to consider Patient encouraged to contact this social worker with any additional community resource needs or support            SDOH assessments and interventions completed:  No     Care Coordination Interventions:  Yes, provided   Follow up plan: No further intervention required.   Encounter Outcome:  Pt. Visit Completed

## 2022-10-16 NOTE — Patient Instructions (Signed)
Visit Information  Thank you for taking time to visit with me today. Please don't hesitate to contact me if I can be of assistance to you.   Following are the goals we discussed today:   Goals Addressed             This Visit's Progress    managing caregiver stress       Care Coordination Interventions: Follow up with patient related to respite care services  Patient confirmed that her mother did go to respite care, however became ill and was transferred over to the hospice home and passed away Active listening / Reflection utilized  Emotional Support Provided Consideration of grief counseling encouraged-patient aware of this option and will continue to consider Patient encouraged to contact this social worker with any additional community resource needs or support              Please call the care guide team at [phone] if you need to cancel or reschedule your appointment.   If you are experiencing a Mental Health or [place] or need someone to talk to, please call the Suicide and Crisis Lifeline: 988   Patient verbalizes understanding of instructions and care plan provided today and agrees to view in [place]. Active MyChart status and patient understanding of how to access instructions and care plan via MyChart confirmed with patient.     No further follow up required: patient to contact this Education officer, museum with any additional community resource needs  [employer], [place] Worker  [facility] Care Management [phone]

## 2022-10-22 DIAGNOSIS — K5190 Ulcerative colitis, unspecified, without complications: Secondary | ICD-10-CM | POA: Diagnosis not present

## 2022-10-26 ENCOUNTER — Other Ambulatory Visit: Payer: Self-pay | Admitting: Physician Assistant

## 2022-10-26 DIAGNOSIS — F3289 Other specified depressive episodes: Secondary | ICD-10-CM

## 2022-11-05 ENCOUNTER — Encounter: Payer: Self-pay | Admitting: Family

## 2022-11-14 ENCOUNTER — Other Ambulatory Visit: Payer: Self-pay | Admitting: Family Medicine

## 2022-11-14 DIAGNOSIS — N6489 Other specified disorders of breast: Secondary | ICD-10-CM

## 2022-11-30 ENCOUNTER — Ambulatory Visit (INDEPENDENT_AMBULATORY_CARE_PROVIDER_SITE_OTHER): Payer: Medicare Other | Admitting: Physician Assistant

## 2022-11-30 ENCOUNTER — Encounter: Payer: Self-pay | Admitting: Physician Assistant

## 2022-11-30 ENCOUNTER — Ambulatory Visit: Payer: Self-pay | Admitting: *Deleted

## 2022-11-30 VITALS — BP 108/69 | HR 65 | Temp 98.3°F | Wt 163.0 lb

## 2022-11-30 DIAGNOSIS — J029 Acute pharyngitis, unspecified: Secondary | ICD-10-CM

## 2022-11-30 DIAGNOSIS — R197 Diarrhea, unspecified: Secondary | ICD-10-CM

## 2022-11-30 DIAGNOSIS — R059 Cough, unspecified: Secondary | ICD-10-CM

## 2022-11-30 LAB — POCT RAPID STREP A (OFFICE): Rapid Strep A Screen: NEGATIVE

## 2022-11-30 LAB — POCT INFLUENZA A/B: Influenza A, POC: NEGATIVE

## 2022-11-30 LAB — POC COVID19 BINAXNOW

## 2022-11-30 NOTE — Telephone Encounter (Signed)
Reason for Disposition  SEVERE (e.g., excruciating) throat pain  Answer Assessment - Initial Assessment Questions 1. ONSET: "When did the throat start hurting?" (Hours or days ago)      Last Tuesday- feeling bad 2. SEVERITY: "How bad is the sore throat?" (Scale 1-10; mild, moderate or severe)   - MILD (1-3):  Doesn't interfere with eating or normal activities.   - MODERATE (4-7): Interferes with eating some solids and normal activities.   - SEVERE (8-10):  Excruciating pain, interferes with most normal activities.   - SEVERE WITH DYSPHAGIA (10): Can't swallow liquids, drooling.     severe 3. STREP EXPOSURE: "Has there been any exposure to strep within the past week?" If Yes, ask: "What type of contact occurred?"      None known 4.  VIRAL SYMPTOMS: "Are there any symptoms of a cold, such as a runny nose, cough, hoarse voice or red eyes?"      cough 5. FEVER: "Do you have a fever?" If Yes, ask: "What is your temperature, how was it measured, and when did it start?"     no 6. PUS ON THE TONSILS: "Is there pus on the tonsils in the back of your throat?"     na 7. OTHER SYMPTOMS: "Do you have any other symptoms?" (e.g., difficulty breathing, headache, rash)     Diarrhea- started last night," feels bad"  Protocols used: Sore Throat-A-AH

## 2022-11-30 NOTE — Progress Notes (Signed)
[place],acting as a Education administrator for [employer], PA-C.,have documented all relevant documentation on the behalf of Karla Speak, PA-C,as directed by  [employer], PA-C while in the presence of [employer], PA-C.      Established patient visit   Patient: Karla [name]   DOB: [birth year]   67 y.o. Female  MRN: [id number] Visit Date: 11/30/2022  Today's healthcare provider: [name], PA-C  CC: sore throat and diarrhea  Subjective    HPI  Patient is a 67 year old female who presents with complaint of sore throat and diarrhea.  Her sore throat began 2 days ago.  She has been using Tylenol and Theraflu for those symptoms.  She states she also had cough  Medications: Outpatient Medications Prior to Visit  Medication Sig   acetaminophen (TYLENOL) 325 MG tablet Take by mouth.   citalopram (CELEXA) 40 MG tablet TAKE 1 TABLET(40 MG) BY MOUTH DAILY   diphenhydrAMINE (BENADRYL) 25 MG tablet Take 25 mg by mouth every 6 (six) hours as needed.   Melatonin 5 MG SUBL Place 5 mg under the tongue at bedtime as needed.   omeprazole (PRILOSEC) 20 MG capsule TAKE 1 CAPSULE(20 MG) BY MOUTH DAILY   ustekinumab (STELARA) 90 MG/ML SOSY injection '90mg'$  subcutaneous every 8weeks   [DISCONTINUED] azelastine (ASTELIN) 0.1 % nasal spray Place 1 spray into both nostrils 2 (two) times daily. Use in each nostril as directed (Patient not taking: Reported on 08/30/2022)   No facility-administered medications prior to visit.    Review of Systems  Constitutional:  Positive for fatigue. Negative for chills, diaphoresis and fever.  HENT:  Positive for congestion, postnasal drip, rhinorrhea, sneezing, sore throat, trouble swallowing and voice change. Negative for ear discharge, ear pain, facial swelling, hearing loss, sinus pressure, sinus pain and tinnitus.   Eyes:  Positive for photophobia. Negative for pain, discharge, redness, itching and visual disturbance.  Respiratory:  Positive for cough (non  productive) and shortness of breath. Negative for wheezing.   Cardiovascular:  Positive for palpitations. Negative for chest pain and leg swelling.  Gastrointestinal:  Positive for diarrhea. Negative for abdominal pain, blood in stool, constipation, nausea, rectal pain and vomiting.  Musculoskeletal:  Negative for myalgias.  Neurological:  Positive for light-headedness. Negative for dizziness and headaches.       Objective    BP 108/69 (BP Location: Left Arm, Patient Position: Sitting, Cuff Size: Normal)   Pulse 65   Temp 98.3 F (36.8 C) (Oral)   Wt 163 lb (73.9 kg)   SpO2 98%   BMI 28.87 kg/m    Physical Exam Vitals reviewed.  Constitutional:      General: She is not in acute distress.    Appearance: Normal appearance. She is well-developed. She is not diaphoretic.  HENT:     Head: Normocephalic and atraumatic.     Right Ear: Ear canal and external ear normal.     Left Ear: Ear canal and external ear normal.     Nose: Congestion (mild) and rhinorrhea present.     Mouth/Throat:     Pharynx: Posterior oropharyngeal erythema (mild) present.  Eyes:     General: No scleral icterus.       Right eye: No discharge.        Left eye: No discharge.     Extraocular Movements: Extraocular movements intact.     Conjunctiva/sclera: Conjunctivae normal.     Pupils: Pupils are equal, round, and reactive to light.  Neck:  Thyroid: No thyromegaly.  Cardiovascular:     Rate and Rhythm: Normal rate and regular rhythm.     Pulses: Normal pulses.     Heart sounds: Normal heart sounds. No murmur heard. Pulmonary:     Effort: Pulmonary effort is normal. No respiratory distress.     Breath sounds: Normal breath sounds. No wheezing, rhonchi or rales.  Musculoskeletal:     Cervical back: Neck supple.     Right lower leg: No edema.     Left lower leg: No edema.  Lymphadenopathy:     Cervical: No cervical adenopathy.  Skin:    General: Skin is warm and dry.     Findings: No rash.   Neurological:     Mental Status: She is alert and oriented to person, place, and time. Mental status is at baseline.  Psychiatric:        Mood and Affect: Mood normal.        Behavior: Behavior normal.      No results found for any visits on 11/30/22.  Assessment & Plan     Sore throat Cough, unspecified type X 2 days  Could be due to viral infection Symptomatic treatment was advised Increase fluids.  Rest.  Saline nasal spray.   Mucinex as directed.  Humidifier in bedroom. Flonase per orders.  Warm salt gargles, hot tea with honey - POCT Influenza A/B neg - POCT rapid strep A neg - POC COVID-19 neg The patient was advised to call back or seek an in-person evaluation if the symptoms worsen or if the condition fails to improve as anticipated.  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 Recommended a good-quality Sleep and daily exercise.  Instructions for IBS diet were provided   No follow-ups on file.     I discussed the assessment and treatment plan with the patient. The patient was provided an opportunity to ask questions and all were answered. The patient agreed with the plan and demonstrated an understanding of the instructions.  I, Karla Speak, PA-C have reviewed all documentation for this visit. The documentation on  11/30/22  for the exam, diagnosis, procedures, and orders are all accurate and complete.  Karla [name], [place] [facility], [place] [phone] (phone) [phone] (fax)  [place]

## 2022-11-30 NOTE — Telephone Encounter (Signed)
  Chief Complaint: sore throat, cough, diarrhea, fatigue Symptoms: see above Frequency: symptoms started last Tuesday- COVID test negative Pertinent Negatives: Patient denies fever- other symptoms Disposition: [] ED /[] Urgent Care (no appt availability in office) / [x] Appointment(In office/virtual)/ []  [place] [facility]/ [] Home Care/ [] Refused Recommended Disposition /[] [place] [facility]/ []  Follow-up with PCP Additional Notes: Patient has ulcerative colitis and thinks she may have started a flare with her diet- she started diarrhea last night. Sore throat, cough- "not feeling well" since Tuesday. Patient has appointment in office for evaluation

## 2022-12-12 ENCOUNTER — Ambulatory Visit
Admission: RE | Admit: 2022-12-12 | Discharge: 2022-12-12 | Disposition: A | Discharge status: Home / Self Care | Payer: Medicare Other | Source: Ambulatory Visit | Attending: Family Medicine | Admitting: Family Medicine

## 2022-12-12 DIAGNOSIS — N6489 Other specified disorders of breast: Secondary | ICD-10-CM | POA: Diagnosis not present

## 2022-12-12 DIAGNOSIS — R92321 Mammographic fibroglandular density, right breast: Secondary | ICD-10-CM | POA: Insufficient documentation

## 2022-12-12 DIAGNOSIS — Z1239 Encounter for other screening for malignant neoplasm of breast: Secondary | ICD-10-CM | POA: Insufficient documentation

## 2022-12-13 NOTE — Progress Notes (Signed)
Diagnostic mammogram was negative for malignancies. Screening was advised in a August/September 2024

## 2023-01-07 DIAGNOSIS — K5190 Ulcerative colitis, unspecified, without complications: Secondary | ICD-10-CM | POA: Diagnosis not present

## 2023-01-22 ENCOUNTER — Encounter: Payer: Self-pay | Admitting: Family Medicine

## 2023-01-22 ENCOUNTER — Ambulatory Visit (INDEPENDENT_AMBULATORY_CARE_PROVIDER_SITE_OTHER): Payer: Medicare Other | Admitting: Family Medicine

## 2023-01-22 VITALS — BP 109/70 | HR 52 | Temp 97.9°F | Resp 12 | Ht 63.0 in | Wt 162.8 lb

## 2023-01-22 DIAGNOSIS — G629 Polyneuropathy, unspecified: Secondary | ICD-10-CM | POA: Insufficient documentation

## 2023-01-22 DIAGNOSIS — E782 Mixed hyperlipidemia: Secondary | ICD-10-CM

## 2023-01-22 DIAGNOSIS — E663 Overweight: Secondary | ICD-10-CM | POA: Diagnosis not present

## 2023-01-22 DIAGNOSIS — K5100 Ulcerative (chronic) pancolitis without complications: Secondary | ICD-10-CM | POA: Diagnosis not present

## 2023-01-22 DIAGNOSIS — E042 Nontoxic multinodular goiter: Secondary | ICD-10-CM | POA: Diagnosis not present

## 2023-01-22 DIAGNOSIS — R5383 Other fatigue: Secondary | ICD-10-CM

## 2023-01-22 DIAGNOSIS — R5381 Other malaise: Secondary | ICD-10-CM

## 2023-01-22 DIAGNOSIS — R7303 Prediabetes: Secondary | ICD-10-CM

## 2023-01-22 DIAGNOSIS — G4700 Insomnia, unspecified: Secondary | ICD-10-CM

## 2023-01-22 DIAGNOSIS — G588 Other specified mononeuropathies: Secondary | ICD-10-CM

## 2023-01-22 MED ORDER — TRAZODONE HCL 50 MG PO TABS: 25.0000 mg | ORAL_TABLET | Freq: Every evening | ORAL | 3 refills | Status: DC | PRN

## 2023-01-22 NOTE — Assessment & Plan Note (Signed)
Longstanding Uncontrolled Trazodone has been helpful - will Rx low dose

## 2023-01-22 NOTE — Assessment & Plan Note (Signed)
Recheck TSH 

## 2023-01-22 NOTE — Assessment & Plan Note (Signed)
Recommend low carb diet °Recheck A1c  °

## 2023-01-22 NOTE — Assessment & Plan Note (Signed)
L posterior thigh Patient believes this is related to compression from lipoma Hoping it will resolve after lipoma excision check B12

## 2023-01-22 NOTE — Assessment & Plan Note (Signed)
Discussed importance of healthy weight management Discussed diet and exercise  

## 2023-01-22 NOTE — Assessment & Plan Note (Signed)
Followed by [facility] Check CBC and iron panel

## 2023-01-22 NOTE — Assessment & Plan Note (Signed)
New problem Difficult to explain but feeling unwell Plan to check labs for possible underlying etiologies

## 2023-01-22 NOTE — Progress Notes (Signed)
I,Sulibeya S Dimas,acting as a Neurosurgeon for Shirlee Latch, MD.,have documented all relevant documentation on the behalf of Shirlee Latch, MD,as directed by  Shirlee Latch, MD while in the presence of Shirlee Latch, MD.     Established patient visit   Patient: Karla [name]   DOB: 1955-10-21   67 y.o. Female  MRN: [id number] Visit Date: 01/22/2023  Today's healthcare provider: Shirlee Latch, MD   Chief Complaint  Patient presents with   Fatigue   Subjective    HPI  Patient requesting labs for just not feeling good. She is wanting to check her thyroid and sugars. She always feels tired and can't lose weight. She reports checking labs for sugars. She reports sleeping at least 6 hours a night. She reports taking Trazodone 50 mg from a friend. She reports trazodone did help with sleep. Was on her allergy list but has had no side effects from taking it.  Feels like she is craving sugar but trying to watch it.  Having difficulty losing weight.   Patient C/O left leg numbness x 1 year. She reports a lipoma on leg. She reports seeing surgical on 01/30/2022.    Medications: Outpatient Medications Prior to Visit  Medication Sig   acetaminophen (TYLENOL) 325 MG tablet Take by mouth.   citalopram (CELEXA) 40 MG tablet TAKE 1 TABLET(40 MG) BY MOUTH DAILY   Melatonin 5 MG SUBL Place 5 mg under the tongue at bedtime as needed.   omeprazole (PRILOSEC) 20 MG capsule TAKE 1 CAPSULE(20 MG) BY MOUTH DAILY   ustekinumab (STELARA) 90 MG/ML SOSY injection 90mg  subcutaneous every 8weeks   [DISCONTINUED] diphenhydrAMINE (BENADRYL) 25 MG tablet Take 25 mg by mouth every 6 (six) hours as needed.   No facility-administered medications prior to visit.    Review of Systems  Constitutional:  Positive for appetite change and fatigue.  Respiratory:  Negative for chest tightness and shortness of breath.   Cardiovascular:  Negative for chest pain and leg swelling.  Gastrointestinal:   Positive for nausea. Negative for abdominal pain and vomiting.  Neurological:  Positive for dizziness and light-headedness.  Psychiatric/Behavioral:  Positive for sleep disturbance.        Objective    BP 109/70 (BP Location: Left Arm, Patient Position: Sitting, Cuff Size: Normal)   Pulse (!) 52   Temp 97.9 F (36.6 C) (Temporal)   Resp 12   Ht 5\' 3"  (1.6 m)   Wt 162 lb 12.8 oz (73.8 kg)   BMI 28.84 kg/m    Physical Exam Vitals reviewed.  Constitutional:      General: She is not in acute distress.    Appearance: Normal appearance. She is well-developed. She is not diaphoretic.  HENT:     Head: Normocephalic and atraumatic.  Eyes:     General: No scleral icterus.    Conjunctiva/sclera: Conjunctivae normal.  Neck:     Thyroid: No thyromegaly.  Cardiovascular:     Rate and Rhythm: Normal rate and regular rhythm.     Pulses: Normal pulses.     Heart sounds: Normal heart sounds. No murmur heard. Pulmonary:     Effort: Pulmonary effort is normal. No respiratory distress.     Breath sounds: Normal breath sounds. No wheezing, rhonchi or rales.  Musculoskeletal:     Cervical back: Neck supple.     Right lower leg: No edema.     Left lower leg: No edema.  Lymphadenopathy:     Cervical: No cervical adenopathy.  Skin:  General: Skin is warm and dry.     Findings: No rash.  Neurological:     Mental Status: She is alert and oriented to person, place, and time. Mental status is at baseline.  Psychiatric:        Mood and Affect: Mood normal.        Behavior: Behavior normal.       No results found for any visits on 01/22/23.  Assessment & Plan     Problem List Items Addressed This Visit       Digestive   Chronic universal ulcerative colitis (HCC)    Followed by [facility] Check CBC and iron panel      Relevant Orders   CBC with Differential/Platelet   Iron, TIBC and Ferritin Panel     Endocrine   Multinodular goiter    Recheck TSH      Relevant  Orders   TSH     Nervous and Auditory   Peripheral neuropathy    L posterior thigh Patient believes this is related to compression from lipoma Hoping it will resolve after lipoma excision check B12      Relevant Medications   traZODone (DESYREL) 50 MG tablet   Other Relevant Orders   B12     Other   Overweight    Discussed importance of healthy weight management Discussed diet and exercise       Relevant Orders   TSH   CBC with Differential/Platelet   Hemoglobin A1c   Comprehensive metabolic panel   Lipid panel   VITAMIN D 25 Hydroxy (Vit-D Deficiency, Fractures)   Iron, TIBC and Ferritin Panel   B12   Prediabetes    Recommend low carb diet Recheck A1c       Relevant Orders   Hemoglobin A1c   Malaise and fatigue - Primary    New problem Difficult to explain but feeling unwell Plan to check labs for possible underlying etiologies      Relevant Orders   TSH   CBC with Differential/Platelet   Hemoglobin A1c   Comprehensive metabolic panel   Lipid panel   VITAMIN D 25 Hydroxy (Vit-D Deficiency, Fractures)   Iron, TIBC and Ferritin Panel   B12   Mixed hyperlipidemia    Reviewed last lipid panel Not currently on a statin Recheck FLP and CMP Discussed diet and exercise       Relevant Orders   Comprehensive metabolic panel   Lipid panel   Insomnia    Longstanding Uncontrolled Trazodone has been helpful - will Rx low dose      Relevant Orders   B12     Return in about 3 months (around 04/23/2023) for CPE.      I, Shirlee Latch, MD, have reviewed all documentation for this visit. The documentation on 01/22/23 for the exam, diagnosis, procedures, and orders are all accurate and complete.   Trynity Skousen, Marzella Schlein, MD, MPH [facility]

## 2023-01-22 NOTE — Assessment & Plan Note (Signed)
Reviewed last lipid panel Not currently on a statin Recheck FLP and CMP Discussed diet and exercise  

## 2023-01-23 LAB — CBC WITH DIFFERENTIAL/PLATELET
Basophils Absolute: 0.1 10*3/uL (ref 0.0–0.2)
Basos: 1 %
EOS (ABSOLUTE): 0.2 10*3/uL (ref 0.0–0.4)
Eos: 3 %
Hematocrit: 41.2 % (ref 34.0–46.6)
Hemoglobin: 13 g/dL (ref 11.1–15.9)
Immature Grans (Abs): 0 10*3/uL (ref 0.0–0.1)
Immature Granulocytes: 0 %
Lymphocytes Absolute: 1.7 10*3/uL (ref 0.7–3.1)
Lymphs: 35 %
MCH: 27.3 pg (ref 26.6–33.0)
MCHC: 31.6 g/dL (ref 31.5–35.7)
MCV: 87 fL (ref 79–97)
Monocytes Absolute: 0.4 10*3/uL (ref 0.1–0.9)
Monocytes: 8 %
Neutrophils Absolute: 2.6 10*3/uL (ref 1.4–7.0)
Neutrophils: 53 %
Platelets: 307 10*3/uL (ref 150–450)
RBC: 4.76 x10E6/uL (ref 3.77–5.28)
RDW: 13 % (ref 11.7–15.4)
WBC: 5 10*3/uL (ref 3.4–10.8)

## 2023-01-23 LAB — COMPREHENSIVE METABOLIC PANEL
ALT: 13 IU/L (ref 0–32)
AST: 20 IU/L (ref 0–40)
Albumin/Globulin Ratio: 1.4 (ref 1.2–2.2)
Albumin: 4.1 g/dL (ref 3.9–4.9)
Alkaline Phosphatase: 89 IU/L (ref 44–121)
BUN/Creatinine Ratio: 25 (ref 12–28)
BUN: 20 mg/dL (ref 8–27)
Bilirubin Total: 0.3 mg/dL (ref 0.0–1.2)
CO2: 22 mmol/L (ref 20–29)
Calcium: 9.5 mg/dL (ref 8.7–10.3)
Chloride: 106 mmol/L (ref 96–106)
Creatinine, Ser: 0.79 mg/dL (ref 0.57–1.00)
Globulin, Total: 2.9 g/dL (ref 1.5–4.5)
Glucose: 104 mg/dL — ABNORMAL HIGH (ref 70–99)
Potassium: 4.8 mmol/L (ref 3.5–5.2)
Sodium: 142 mmol/L (ref 134–144)
Total Protein: 7 g/dL (ref 6.0–8.5)
eGFR: 81 mL/min/{1.73_m2} (ref >59)

## 2023-01-23 LAB — VITAMIN D 25 HYDROXY (VIT D DEFICIENCY, FRACTURES): Vit D, 25-Hydroxy: 25.7 ng/mL — ABNORMAL LOW (ref 30.0–100.0)

## 2023-01-23 LAB — LIPID PANEL
Chol/HDL Ratio: 5.3 ratio — ABNORMAL HIGH (ref 0.0–4.4)
Cholesterol, Total: 230 mg/dL — ABNORMAL HIGH (ref 100–199)
HDL: 43 mg/dL (ref >39)
LDL Chol Calc (NIH): 167 mg/dL — ABNORMAL HIGH (ref 0–99)
Triglycerides: 109 mg/dL (ref 0–149)
VLDL Cholesterol Cal: 20 mg/dL (ref 5–40)

## 2023-01-23 LAB — IRON,TIBC AND FERRITIN PANEL
Ferritin: 13 ng/mL — ABNORMAL LOW (ref 15–150)
Iron Saturation: 15 % (ref 15–55)
Iron: 55 ug/dL (ref 27–139)
Total Iron Binding Capacity: 368 ug/dL (ref 250–450)
UIBC: 313 ug/dL (ref 118–369)

## 2023-01-23 LAB — VITAMIN B12: Vitamin B-12: 798 pg/mL (ref 232–1245)

## 2023-01-23 LAB — HEMOGLOBIN A1C
Est. average glucose Bld gHb Est-mCnc: 128 mg/dL
Hgb A1c MFr Bld: 6.1 % — ABNORMAL HIGH (ref 4.8–5.6)

## 2023-01-23 LAB — TSH: TSH: 2.11 u[IU]/mL (ref 0.450–4.500)

## 2023-01-31 ENCOUNTER — Ambulatory Visit (INDEPENDENT_AMBULATORY_CARE_PROVIDER_SITE_OTHER): Payer: Medicare Other | Admitting: Surgery

## 2023-01-31 ENCOUNTER — Encounter: Payer: Self-pay | Admitting: Surgery

## 2023-01-31 VITALS — BP 121/70 | HR 53 | Temp 98.0°F | Ht 63.0 in | Wt 160.0 lb

## 2023-01-31 DIAGNOSIS — R200 Anesthesia of skin: Secondary | ICD-10-CM

## 2023-01-31 DIAGNOSIS — D1724 Benign lipomatous neoplasm of skin and subcutaneous tissue of left leg: Secondary | ICD-10-CM | POA: Diagnosis not present

## 2023-01-31 DIAGNOSIS — R202 Paresthesia of skin: Secondary | ICD-10-CM | POA: Diagnosis not present

## 2023-01-31 NOTE — Progress Notes (Signed)
Patient ID: Karla [name], female   DOB: 1956-05-15, 67 y.o.   MRN: [id number]  Chief Complaint: Left posterior thigh mass  History of Present Illness She continues to have some sporadic numbness and tingling that notably radiates all the way to the foot, but also radiates proximally into the buttock now.  This is new compared to our last evaluation. She reports it is not consistent with sitting and pressure, she reports it happens when she is walking in the mall, she continues to have concerns about sporadic nature.  She reports that he does not experience it when laying down and sleeping at night. Previously: Karla [name] is a 67 y.o. female with a known bump of the left posterior thigh, present for about 5 years.  Reports its the same size its always been.  Denies pain, denies drainage.  She has some concerns of recent where there is been a little tingling distally from the lesion, and also proximally from the lesion.  Not alarming, just wanted to have it checked out.  Past Medical History Past Medical History:  Diagnosis Date   GERD (gastroesophageal reflux disease)    Insomnia    Restless leg syndrome    Ulcerative colitis (HCC)    Vertigo       Past Surgical History:  Procedure Laterality Date   EXTRACORPOREAL SHOCK WAVE LITHOTRIPSY Left 03/31/2020   Procedure: EXTRACORPOREAL SHOCK WAVE LITHOTRIPSY (ESWL);  Surgeon: Vanna Scotland, MD;  Location: [facility] ORS;  Service: Urology;  Laterality: Left;   NECK SURGERY     SHOULDER SURGERY Left     Allergies  Allergen Reactions   Mesalamine     Other reaction(s):Pancreatitis    Prednisone Itching and Palpitations    Hair loss Alopecia    Amoxicillin Other (See Comments)    Other reaction(s): Unknown    Metronidazole Nausea And Vomiting    Abdominal pain, dysgeusia      Current Outpatient Medications  Medication Sig Dispense Refill   acetaminophen (TYLENOL) 325 MG tablet Take by mouth.     citalopram (CELEXA) 40 MG tablet  TAKE 1 TABLET(40 MG) BY MOUTH DAILY 90 tablet 1   Melatonin 5 MG SUBL Place 5 mg under the tongue at bedtime as needed.     omeprazole (PRILOSEC) 20 MG capsule TAKE 1 CAPSULE(20 MG) BY MOUTH DAILY 90 capsule 3   traZODone (DESYREL) 50 MG tablet Take 0.5-1 tablets (25-50 mg total) by mouth at bedtime as needed for sleep. 30 tablet 3   ustekinumab (STELARA) 90 MG/ML SOSY injection 90mg  subcutaneous every 8weeks     No current facility-administered medications for this visit.    Family History Family History  Problem Relation Age of Onset   Congestive Heart Failure Mother    COPD Mother    Macular degeneration Mother    Vitiligo Mother    Arthritis Father    Diabetes Father    Breast cancer Paternal Grandmother       Social History Social History   Tobacco Use   Smoking status: Never    Passive exposure: Never   Smokeless tobacco: Never  Vaping Use   Vaping Use: Never used  Substance Use Topics   Alcohol use: Never   Drug use: Never        Review of Systems  Constitutional: Negative.   HENT: Negative.    Eyes: Negative.   Respiratory: Negative.    Cardiovascular: Negative.   Gastrointestinal:  Positive for constipation.  Genitourinary: Negative.   Skin: Negative.  Neurological:  Positive for tingling.  Psychiatric/Behavioral: Negative.        Physical Exam Blood pressure 121/70, pulse (!) 53, temperature 98 F (36.7 C), height 5\' 3"  (1.6 m), weight 160 lb (72.6 kg), SpO2 97 %. Last Weight  Most recent update: 01/31/2023 10:42 AM    Weight  72.6 kg (160 lb)             CONSTITUTIONAL: Well developed, and nourished, appropriately responsive and aware without distress.   EYES: Sclera non-icteric.   EARS, NOSE, MOUTH AND THROAT:  The oropharynx is clear. Oral mucosa is pink and moist.  Hearing is intact to voice.  NECK: Trachea is midline, and there is no jugular venous distension.  LYMPH NODES:  Lymph nodes in the neck are not enlarged. RESPIRATORY:   Lungs are clear, and breath sounds are equal bilaterally. Normal respiratory effort without pathologic use of accessory muscles. CARDIOVASCULAR: Heart is regular in rate and rhythm. GI: The abdomen is soft, nontender, and nondistended.  MUSCULOSKELETAL:  Symmetrical muscle tone appreciated in all four extremities.    Posterior left mid thigh, there is a horizontally based elliptically shaped lesion approximately 2-1/2 to 3 cm in length, approximately 1 to 2 cm in cephalocaudad diameter.  Well-defined, discrete, immediately subdermal area consistent with subcu cutaneous adipose i.e. lipoma. SKIN: Skin turgor is normal. No pathologic skin lesions appreciated.  PSYCH:  Alert and oriented to person, place and time. Affect is appropriate for situation.  Data Reviewed I have personally reviewed what is currently available of the patient's imaging, recent labs and medical records.   Labs:     Latest Ref Rng & Units 01/22/2023   12:58 PM 03/23/2021    4:34 PM 03/27/2020    6:28 PM  CBC  WBC 3.4 - 10.8 x10E3/uL 5.0  6.0  6.9   Hemoglobin 11.1 - 15.9 g/dL 78.2  95.6  21.3   Hematocrit 34.0 - 46.6 % 41.2  39.9  36.3   Platelets 150 - 450 x10E3/uL 307  304  311       Latest Ref Rng & Units 01/22/2023   12:58 PM 07/28/2021    9:10 AM 03/23/2021    4:34 PM  CMP  Glucose 70 - 99 mg/dL 086  578  96   BUN 8 - 27 mg/dL 20  17  23    Creatinine 0.57 - 1.00 mg/dL 4.69  6.29  5.28   Sodium 134 - 144 mmol/L 142  140  140   Potassium 3.5 - 5.2 mmol/L 4.8  4.4  4.3   Chloride 96 - 106 mmol/L 106  105  102   CO2 20 - 29 mmol/L 22  23  22    Calcium 8.7 - 10.3 mg/dL 9.5  9.4  9.6   Total Protein 6.0 - 8.5 g/dL 7.0  7.0  7.2   Total Bilirubin 0.0 - 1.2 mg/dL 0.3  0.3  0.3   Alkaline Phos 44 - 121 IU/L 89  92  92   AST 0 - 40 IU/L 20  12  17    ALT 0 - 32 IU/L 13  10  11        Imaging:  Within last 24 hrs: No results found.  Assessment    Lipoma left posterior thigh.  Minimally symptomatic. Suspect  there may be a mild degree of peripheral neuropathy, but due to her concerns would like to have further evaluation to ensure there is no other impending issue that is progressive. Patient Active  Problem List   Diagnosis Date Noted   Prediabetes 01/22/2023   Malaise and fatigue 01/22/2023   Mixed hyperlipidemia 01/22/2023   Insomnia 01/22/2023   Peripheral neuropathy 01/22/2023   Overweight 03/23/2021   Palpitations 03/23/2021   DOE (dyspnea on exertion) 03/23/2021   Hiatal hernia 05/04/2019   Drug-induced lupus erythematosus 12/22/2018   Depression 05/14/2018   Chronic universal ulcerative colitis (HCC) 10/26/2015   History of Clostridium difficile infection 06/03/2012   IBS (irritable bowel syndrome) 06/03/2012   IFG (impaired fasting glucose) 06/03/2012   Memory loss 06/03/2012   Multinodular goiter 06/03/2012   Obstructive sleep apnea 06/03/2012   Stress incontinence in female 06/03/2012   Other osteoporosis without current pathological fracture 06/03/2012   Menopausal and postmenopausal disorder 06/03/2012    Plan    We discussed the options of proceeding with excision, and unable to completely guarantee this will improve any issues she has, she felt prudent to leave it alone for now.  Quite convinced this is benign in its etiology, glad to see this pleasant patient back again should she have reconsideration or looming issues of increasing discomfort or annoyance.  I still believe removing this lipoma will alleviate her current symptoms.  I believe it still is causing her significant concern and would like to have someone with a neurological focus to evaluate her.  Referring her to Dr. Osborne Oman office, should additional diagnostic evaluation be helpful.  Face-to-face time spent with the patient and accompanying care providers(if present) was 20 minutes, with more than 50% of the time spent counseling, educating, and coordinating care of the patient.    These notes generated  with voice recognition software. I apologize for typographical errors.  Campbell Lerner M.D., FACS 01/31/2023, 10:45 AM

## 2023-01-31 NOTE — Patient Instructions (Addendum)
We will refer you to Neurology to look at the numbness for your left leg. They will call you to schedule this appointment.    If the area starts to bother you more or you would like to discuss other options let us know and we will see you again to talk about this.    Lipoma  A lipoma is a noncancerous (benign) tumor that is made up of fat cells. This is a very common type of soft-tissue growth. Lipomas are usually found under the skin (subcutaneous). They may occur in any tissue of the body that contains fat. Common areas for lipomas to appear include the back, arms, shoulders, buttocks, and thighs. Lipomas grow slowly, and they are usually painless. Most lipomas do not cause problems and do not require treatment. What are the causes? The cause of this condition is not known. What increases the risk? You are more likely to develop this condition if: You are 7-39 years old. You have a family history of lipomas. What are the signs or symptoms? A lipoma usually appears as a small, round bump under the skin. In most cases, the lump will: Feel soft or rubbery. Not cause pain or other symptoms. However, if a lipoma is located in an area where it pushes on nerves, it can become painful or cause other symptoms. How is this diagnosed? A lipoma can usually be diagnosed with a physical exam. You may also have tests to confirm the diagnosis and to rule out other conditions. Tests may include: Imaging tests, such as a CT scan or an MRI. Removal of a tissue sample to be looked at under a microscope (biopsy). How is this treated? Treatment for this condition depends on the size of the lipoma and whether it is causing any symptoms. For small lipomas that are not causing problems, no treatment is needed. If a lipoma is bigger or it causes problems, surgery may be done to remove the lipoma. Lipomas can also be removed to improve appearance. Most often, the procedure is done after applying a medicine that  numbs the area (local anesthetic). Liposuction may be done to reduce the size of the lipoma before it is removed through surgery, or it may be done to remove the lipoma. Lipomas are removed with this method to limit incision size and scarring. A liposuction tube is inserted through a small incision into the lipoma, and the contents of the lipoma are removed through the tube with suction. Follow these instructions at home: Watch your lipoma for any changes. Keep all follow-up visits. This is important. Where to find more information OrthoInfo: orthoinfo.aaos.org Contact a health care provider if: Your lipoma becomes larger or hard. Your lipoma becomes painful, red, or increasingly swollen. These could be signs of infection or a more serious condition. Get help right away if: You develop tingling or numbness in an area near the lipoma. This could indicate that the lipoma is causing nerve damage. Summary A lipoma is a noncancerous tumor that is made up of fat cells. Most lipomas do not cause problems and do not require treatment. If a lipoma is bigger or it causes problems, surgery may be done to remove the lipoma. Contact a health care provider if your lipoma becomes larger or hard, or if it becomes painful, red, or increasingly swollen. These could be signs of infection or a more serious condition. This information is not intended to replace advice given to you by your health care provider. Make sure you discuss any  questions you have with your health care provider. Document Revised: 09/29/2021 Document Reviewed: 09/29/2021 Elsevier Patient Education  2023 [employer].

## 2023-02-22 NOTE — Progress Notes (Unsigned)
Referring Physician:  Erasmo Downer, MD [address] [place],  [place] [address]  Primary Physician:  Erasmo Downer, MD  History of Present Illness: 02/28/2023 Ms. Karla [name] has a history of OSA, UC/IBS, peripheral neuropathy, osteoporosis, hyperlipidemia, GERD.    Seen by general surgery for left posterior thigh mass. Referred for intermittent numbness/tingling from left buttock into her foot.   1+ year history of left buttock intermittent numbness/tingling into posterior leg to her foot. Worse in last 6 months. She notes weakness in her left leg. Some improvement with stretching. No LBP. No right leg pain. No improvement with prn tylenol.   Bowel/Bladder Dysfunction: none  Conservative measures:  Physical therapy: No Multimodal medical therapy including regular antiinflammatories: Tylenol  Injections: No epidural steroid injections  Past Surgery:  History of cervical surgery years ago.   Karla [name] has no symptoms of cervical myelopathy.  The symptoms are causing a significant impact on the patient's life.   Review of Systems:  A 10 point review of systems is negative, except for the pertinent positives and negatives detailed in the HPI.  Past Medical History: Past Medical History:  Diagnosis Date   GERD (gastroesophageal reflux disease)    Insomnia    Restless leg syndrome    Ulcerative colitis (HCC)    Vertigo     Past Surgical History: Past Surgical History:  Procedure Laterality Date   EXTRACORPOREAL SHOCK WAVE LITHOTRIPSY Left 03/31/2020   Procedure: EXTRACORPOREAL SHOCK WAVE LITHOTRIPSY (ESWL);  Surgeon: Vanna Scotland, MD;  Location: [facility] ORS;  Service: Urology;  Laterality: Left;   NECK SURGERY     SHOULDER SURGERY Left     Allergies: Allergies as of 02/28/2023 - Review Complete 01/31/2023  Allergen Reaction Noted   Mesalamine  01/23/2008   Prednisone Itching and Palpitations 07/25/2008   Amoxicillin Other (See  Comments) 05/25/2008   Metronidazole Nausea And Vomiting 03/09/2012    Medications: Outpatient Encounter Medications as of 02/28/2023  Medication Sig   acetaminophen (TYLENOL) 325 MG tablet Take by mouth.   citalopram (CELEXA) 40 MG tablet TAKE 1 TABLET(40 MG) BY MOUTH DAILY   Melatonin 5 MG SUBL Place 5 mg under the tongue at bedtime as needed.   omeprazole (PRILOSEC) 20 MG capsule TAKE 1 CAPSULE(20 MG) BY MOUTH DAILY   traZODone (DESYREL) 50 MG tablet Take 0.5-1 tablets (25-50 mg total) by mouth at bedtime as needed for sleep.   ustekinumab (STELARA) 90 MG/ML SOSY injection 90mg  subcutaneous every 8weeks   No facility-administered encounter medications on file as of 02/28/2023.    Social History: Social History   Tobacco Use   Smoking status: Never    Passive exposure: Never   Smokeless tobacco: Never  Vaping Use   Vaping Use: Never used  Substance Use Topics   Alcohol use: Never   Drug use: Never    Family Medical History: Family History  Problem Relation Age of Onset   Congestive Heart Failure Mother    COPD Mother    Macular degeneration Mother    Vitiligo Mother    Arthritis Father    Diabetes Father    Breast cancer Paternal Grandmother     Physical Examination: There were no vitals filed for this visit.  General: Patient is well developed, well nourished, calm, collected, and in no apparent distress. Attention to examination is appropriate.  Respiratory: Patient is breathing without any difficulty.   NEUROLOGICAL:     Awake, alert, oriented to person, place, and time.  Speech is clear and fluent. Fund of knowledge is appropriate.   Cranial Nerves: Pupils equal round and reactive to light.  Facial tone is symmetric.    No posterior lumbar tenderness. No tenderness in left buttock.   No abnormal lesions on exposed skin.   Strength: Side Biceps Triceps Deltoid Interossei Grip Wrist Ext. Wrist Flex.  R 5 5 5 5 5 5 5   L 5 5 5 5 5 5 5    Side Iliopsoas  Quads Hamstring PF DF EHL  R 5 5 5 5 5 5   L 5 5 5 5 5 5    Reflexes are 2+ and symmetric at the biceps, triceps, brachioradialis, patella and achilles.   Hoffman's is absent.  Clonus is not present.   Bilateral upper and lower extremity sensation is intact to light touch.     Gait is normal.     Medical Decision Making  Imaging: No lumbar imaging.   Assessment and Plan: Karla [name] is a pleasant 67 y.o. female has been by general surgery for left posterior thigh mass.   She has 1+ year history of left buttock intermittent numbness/tingling into posterior leg to her foot that is worse in last 6 months. She notes weakness in her left leg. No specific aggravating factors. No LBP or right leg pain.   No lumbar imaging. Symptoms in left leg are may be lumbar mediated.   Treatment options discussed with patient and following plan made:   - MRI of lumbar spine to evaluate left leg radiculopathy. No relief with time or medications (tylenol).  - Depending on MRI results, may consider PT and/or EMG of bilateral lower extremities.  - Will schedule phone visit to review MRI results once I get them back.   I spent a total of 25 minutes in face-to-face and non-face-to-face activities related to this patient's care today including review of outside records, review of imaging, review of symptoms, physical exam, discussion of differential diagnosis, discussion of treatment options, and documentation.   Thank you for involving me in the care of this patient.   Drake Leach PA-C Dept. of Neurosurgery

## 2023-02-25 DIAGNOSIS — K5190 Ulcerative colitis, unspecified, without complications: Secondary | ICD-10-CM | POA: Diagnosis not present

## 2023-02-28 ENCOUNTER — Ambulatory Visit: Payer: Medicare Other | Admitting: Orthopedic Surgery

## 2023-02-28 ENCOUNTER — Encounter: Payer: Self-pay | Admitting: Orthopedic Surgery

## 2023-02-28 VITALS — BP 110/67 | Ht 63.0 in | Wt 157.8 lb

## 2023-02-28 DIAGNOSIS — M5416 Radiculopathy, lumbar region: Secondary | ICD-10-CM | POA: Diagnosis not present

## 2023-02-28 DIAGNOSIS — R202 Paresthesia of skin: Secondary | ICD-10-CM | POA: Diagnosis not present

## 2023-02-28 DIAGNOSIS — R200 Anesthesia of skin: Secondary | ICD-10-CM | POA: Diagnosis not present

## 2023-02-28 NOTE — Patient Instructions (Signed)
It was so nice to see you today. Thank you so much for coming in.    The numbness and tingling in your left leg may be coming from your back.  I want to get an MRI of your lower back to look into things further. We will get this approved through your insurance and [facility] will call you to schedule the appointment.   Once I get the MRI results back, we will call you to schedule a phone visit with me to review them. It can take 5-7 days for me to get the results.   Please do not hesitate to call if you have any questions or concerns. You can also message me in MyChart.   Drake Leach PA-C [phone]

## 2023-03-01 ENCOUNTER — Telehealth: Payer: Self-pay

## 2023-03-01 NOTE — Telephone Encounter (Signed)
Copied from CRM [id number]. Topic: General - Other >> Mar 01, 2023  3:14 PM Everette C wrote: Reason for CRM: The patient has called to request orders for a blood sugar testing kit   Please contact the patient further if needed

## 2023-03-04 ENCOUNTER — Ambulatory Visit
Admission: RE | Admit: 2023-03-04 | Discharge: 2023-03-04 | Disposition: A | Discharge status: Home / Self Care | Payer: Medicare Other | Source: Ambulatory Visit | Attending: Family Medicine | Admitting: Family Medicine

## 2023-03-04 DIAGNOSIS — Z780 Asymptomatic menopausal state: Secondary | ICD-10-CM | POA: Diagnosis not present

## 2023-03-04 DIAGNOSIS — M810 Age-related osteoporosis without current pathological fracture: Secondary | ICD-10-CM | POA: Diagnosis not present

## 2023-03-04 NOTE — Progress Notes (Signed)
Spoke with pt and discuss her Imaging results. Recommendations for optimization of Ca and vit D intake was discussed. Pt was scheduled for MRI lumbar spine this week.

## 2023-03-07 ENCOUNTER — Ambulatory Visit
Admission: RE | Admit: 2023-03-07 | Discharge: 2023-03-07 | Disposition: A | Discharge status: Home / Self Care | Payer: Medicare Other | Source: Ambulatory Visit | Attending: Orthopedic Surgery | Admitting: Orthopedic Surgery

## 2023-03-07 DIAGNOSIS — M5416 Radiculopathy, lumbar region: Secondary | ICD-10-CM | POA: Diagnosis not present

## 2023-03-07 DIAGNOSIS — R202 Paresthesia of skin: Secondary | ICD-10-CM | POA: Insufficient documentation

## 2023-03-07 DIAGNOSIS — R200 Anesthesia of skin: Secondary | ICD-10-CM | POA: Insufficient documentation

## 2023-03-07 DIAGNOSIS — M545 Low back pain, unspecified: Secondary | ICD-10-CM | POA: Diagnosis not present

## 2023-03-07 DIAGNOSIS — M48061 Spinal stenosis, lumbar region without neurogenic claudication: Secondary | ICD-10-CM | POA: Diagnosis not present

## 2023-03-19 ENCOUNTER — Telehealth: Payer: Self-pay | Admitting: Family Medicine

## 2023-03-21 NOTE — Progress Notes (Signed)
Telephone Visit- Progress Note: Referring Physician:  Erasmo Downer, MD [address] [place],  [place] [address]  Primary Physician:  Erasmo Downer, MD  This visit was performed via telephone.  Patient location: home Provider location: office  I spent a total of 10 minutes non-face-to-face activities for this visit on the date of this encounter including review of current clinical condition and response to treatment.    Patient has given verbal consent to this telephone visits and we reviewed the limitations of a telephone visit. Patient wishes to proceed.    Chief Complaint:  review MRI results  History of Present Illness: Karla [name] is a 67 y.o. female has a history of OSA, UC/IBS, peripheral neuropathy, osteoporosis, hyperlipidemia, GERD.    Last seen by me on 02/28/23 for left buttock intermittent numbness/tingling into posterior leg to her foot. No lumbar imaging was done.   Phone visit scheduled to review her lumbar MRI scan.   She continues with intermittent numbness/tingling in her left buttock that radiates into posterior leg to her foot. This can be worse with standing and walking. She has some weakness in left leg. No LBP and no leg pain.   Bowel/Bladder Dysfunction: none   Conservative measures:  Physical therapy: No Multimodal medical therapy including regular antiinflammatories: Tylenol  Injections: No epidural steroid injections   Past Surgery:  History of cervical surgery years ago.   Exam: No exam done as this was a telephone encounter.     Imaging: Lumbar MRI dated 03/07/23:  FINDINGS: Segmentation:  Standard.   Alignment: There is 0.7 cm anterolisthesis L4 on L5 and trace anterolisthesis L5 on S1 due to facet arthropathy.   Vertebrae:  No fracture, evidence of discitis, or bone lesion.   Conus medullaris and cauda equina: Conus extends to the T12 level. Conus and cauda equina appear normal.   Paraspinal and other  soft tissues: Parapelvic right renal cysts incidentally noted.   Disc levels:   T11-12 is imaged in the sagittal plane only and negative.   T12-L1: Negative.   L1-2: Negative.   L2-3: Negative.   L3-4: Negative.   L4-5: The disc is uncovered with a shallow bulge. There is advanced facet arthropathy and some ligamentum flavum thickening. Moderate central canal stenosis is present. The foramina are open.   L5-S1: Advanced bilateral facet degenerative change and a shallow disc bulge without stenosis.   IMPRESSION: 1. Advanced facet arthropathy at L4-5 and L5-S1 results in 0.7 cm anterolisthesis L4 on L5 and trace anterolisthesis L5 on S1. 2. Moderate central canal stenosis at L4-5 without foraminal narrowing.     Electronically Signed   By: Drusilla Kanner M.D.   On: 03/16/2023 12:12  I have personally reviewed the images and agree with the above interpretation.  Assessment and Plan: [name] is a pleasant 67 y.o. female with intermittent numbness/tingling in her left buttock that radiates into posterior leg to her foot. No LBP and no leg pain.   She has known slip L4-L5 with moderate central stenosis and facet hypertrophy L4-S1.   Treatment options discussed with patient and her daughter. Following plan made:   - We discussed that I would expect findings on MRI to cause symptoms in both legs.  - Recommend referral to neurology for further evaluation. May need to get EMG. Will send to Dr. Sherryll Burger. She has seen him years ago.  - Will call her in 4 weeks to check on her.   Drake Leach PA-C Neurosurgery

## 2023-03-22 ENCOUNTER — Ambulatory Visit (INDEPENDENT_AMBULATORY_CARE_PROVIDER_SITE_OTHER): Payer: Medicare Other | Admitting: Orthopedic Surgery

## 2023-03-22 ENCOUNTER — Encounter: Payer: Self-pay | Admitting: Orthopedic Surgery

## 2023-03-22 DIAGNOSIS — R200 Anesthesia of skin: Secondary | ICD-10-CM | POA: Diagnosis not present

## 2023-03-22 DIAGNOSIS — R202 Paresthesia of skin: Secondary | ICD-10-CM

## 2023-03-22 DIAGNOSIS — M4310 Spondylolisthesis, site unspecified: Secondary | ICD-10-CM

## 2023-03-22 DIAGNOSIS — M48061 Spinal stenosis, lumbar region without neurogenic claudication: Secondary | ICD-10-CM

## 2023-03-22 DIAGNOSIS — M47816 Spondylosis without myelopathy or radiculopathy, lumbar region: Secondary | ICD-10-CM | POA: Diagnosis not present

## 2023-04-03 DIAGNOSIS — K080 Exfoliation of teeth due to systemic causes: Secondary | ICD-10-CM | POA: Diagnosis not present

## 2023-04-11 DIAGNOSIS — K5190 Ulcerative colitis, unspecified, without complications: Secondary | ICD-10-CM | POA: Diagnosis not present

## 2023-04-22 DIAGNOSIS — M899 Disorder of bone, unspecified: Secondary | ICD-10-CM | POA: Diagnosis not present

## 2023-04-22 DIAGNOSIS — M792 Neuralgia and neuritis, unspecified: Secondary | ICD-10-CM | POA: Diagnosis not present

## 2023-04-25 ENCOUNTER — Encounter: Payer: Medicare Other | Admitting: Family Medicine

## 2023-04-27 ENCOUNTER — Other Ambulatory Visit: Payer: Self-pay | Admitting: Physician Assistant

## 2023-04-27 DIAGNOSIS — F3289 Other specified depressive episodes: Secondary | ICD-10-CM

## 2023-04-29 NOTE — Telephone Encounter (Signed)
Requested Prescriptions  Pending Prescriptions Disp Refills   citalopram (CELEXA) 40 MG tablet [Pharmacy Med Name: CITALOPRAM 40MG  TABLETS] 90 tablet 0    Sig: TAKE 1 TABLET(40 MG) BY MOUTH DAILY     Psychiatry:  Antidepressants - SSRI Passed - 04/27/2023 10:10 AM      Passed - Completed PHQ-2 or PHQ-9 in the last 360 days      Passed - Valid encounter within last 6 months    Recent Outpatient Visits           3 months ago [place] and fatigue   [place] [facility] [place], Marzella Schlein, MD   5 months ago Sore throat   [place] [facility] [place], [place], PA-C   8 months ago Nasal congestion   [place] [facility] [place], [place], [place]   11 months ago Other depression   [place] [facility] [place], [place], PA-C   1 year ago Other depression    [facility] [place], [place], [place]

## 2023-05-02 ENCOUNTER — Other Ambulatory Visit: Payer: Self-pay | Admitting: Family Medicine

## 2023-05-02 DIAGNOSIS — Z1231 Encounter for screening mammogram for malignant neoplasm of breast: Secondary | ICD-10-CM

## 2023-05-04 NOTE — Telephone Encounter (Signed)
err

## 2023-05-10 ENCOUNTER — Ambulatory Visit: Payer: Self-pay

## 2023-05-10 NOTE — Telephone Encounter (Signed)
Summary: Left side temple pain advice   Pt is calling to report left side pain on her temples pain 4/10.Started last night. No available appts today. Please advise     Chief Complaint: Left temple pain. 4/10. Comes and goes. Symptoms: Above Frequency: Last night Pertinent Negatives: Patient denies any other symptoms other than itchy eyes Disposition: [] ED /[] Urgent Care (no appt availability in office) / [x] Appointment(In office/virtual)/ []  [facility]/ [] Home Care/ [] Refused Recommended Disposition /[] [facility]/ []  Follow-up with PCP Additional Notes: Pt. Agrees with appointment Monday. Will go to ED for worsening of symptoms.  Reason for Disposition  [1] MILD-MODERATE headache AND [2] present > 72 hours  Answer Assessment - Initial Assessment Questions 1. LOCATION: "Where does it hurt?"      Left temple 2. ONSET: "When did the headache start?" (Minutes, hours or days)      Last night 3. PATTERN: "Does the pain come and go, or has it been constant since it started?"     Comes and goes 4. SEVERITY: "How bad is the pain?" and "What does it keep you from doing?"  (e.g., Scale 1-10; mild, moderate, or severe)   - MILD (1-3): doesn't interfere with normal activities    - MODERATE (4-7): interferes with normal activities or awakens from sleep    - SEVERE (8-10): excruciating pain, unable to do any normal activities        4 5. RECURRENT SYMPTOM: "Have you ever had headaches before?" If Yes, ask: "When was the last time?" and "What happened that time?"      No 6. CAUSE: "What do you think is causing the headache?"     Unsure 7. MIGRAINE: "Have you been diagnosed with migraine headaches?" If Yes, ask: "Is this headache similar?"      No 8. HEAD INJURY: "Has there been any recent injury to the head?"      No 9. OTHER SYMPTOMS: "Do you have any other symptoms?" (fever, stiff neck, eye pain, sore throat, cold symptoms)     Eyes are itchy 10. PREGNANCY: "Is there  any chance you are pregnant?" "When was your last menstrual period?"       No  Protocols used: Headache-A-AH

## 2023-05-13 ENCOUNTER — Other Ambulatory Visit: Payer: Self-pay | Admitting: Neurology

## 2023-05-13 ENCOUNTER — Ambulatory Visit (INDEPENDENT_AMBULATORY_CARE_PROVIDER_SITE_OTHER): Payer: Medicare Other | Admitting: Family Medicine

## 2023-05-13 ENCOUNTER — Encounter: Payer: Self-pay | Admitting: Family Medicine

## 2023-05-13 VITALS — BP 112/77 | HR 53 | Temp 98.2°F | Resp 12 | Ht 63.0 in | Wt 156.9 lb

## 2023-05-13 DIAGNOSIS — R519 Headache, unspecified: Secondary | ICD-10-CM | POA: Insufficient documentation

## 2023-05-13 DIAGNOSIS — M792 Neuralgia and neuritis, unspecified: Secondary | ICD-10-CM

## 2023-05-13 NOTE — Progress Notes (Signed)
   Established Patient Office Visit  Subjective   Patient ID: Karla [name], female    DOB: 10-23-1955  Age: 67 y.o. MRN: [id number]  No chief complaint on file.   Karla [name] is here for a resolved acute headache. She had a sharp intermittent pain near her L temple radiating down to her ear throughout Thursday night. Friday morning it was still present so she took a Tylenol. The pain resolved with the medication. She has been pain free since Friday morning. She endorses itch eyes, itchy ears, and congestion. She denies history of seasonal allergies. She denies visual changes, jaw pain, or muscle aches.      Review of Systems  Constitutional:  Negative for fever.  HENT:  Positive for congestion. Negative for ear pain and sinus pain.   Eyes:  Negative for blurred vision and double vision.  Musculoskeletal:  Negative for joint pain and myalgias.      Objective:     BP 112/77 (BP Location: Left Arm, Patient Position: Sitting, Cuff Size: Large)   Pulse (!) 53   Temp 98.2 F (36.8 C) (Temporal)   Resp 12   Ht 5\' 3"  (1.6 m)   Wt 156 lb 14.4 oz (71.2 kg)   SpO2 99%   BMI 27.79 kg/m    Physical Exam Constitutional:      Appearance: Normal appearance.  HENT:     Head: Normocephalic and atraumatic.     Jaw: No tenderness.     Right Ear: Tympanic membrane, ear canal and external ear normal.     Left Ear: Tympanic membrane, ear canal and external ear normal.     Nose: Nose normal.     Mouth/Throat:     Mouth: Mucous membranes are moist.     Pharynx: Oropharynx is clear.  Eyes:     Conjunctiva/sclera: Conjunctivae normal.     Pupils: Pupils are equal, round, and reactive to light.  Neurological:     Mental Status: She is alert.    No results found for any visits on 05/13/23.   The 10-year ASCVD risk score (Arnett DK, et al., 2019) is: 6.5%    Assessment & Plan:   Problem List Items Addressed This Visit       Other   Headache - Primary    Acute, resolved  Etiology  likely related to seasonal allergies with the weather changes or new mouthguard for teeth grinding  CTM Take OTC Claritin or Zyrtec  Tylenol prn for acute flares        No follow-ups on file.    Rometta Emery, Medical Student  Patient seen along with MS3 student Jodi Marble. I personally evaluated this patient along with the student, and verified all aspects of the history, physical exam, and medical decision making as documented by the student. I agree with the student's documentation and have made all necessary edits.  Leesha Veno, Marzella Schlein, MD, MPH [facility]

## 2023-05-13 NOTE — Assessment & Plan Note (Addendum)
Acute, resolved  Etiology likely related to seasonal allergies with the weather changes or new mouthguard for teeth grinding  CTM Take OTC Claritin or Zyrtec  Tylenol prn for acute flares

## 2023-05-14 ENCOUNTER — Ambulatory Visit (INDEPENDENT_AMBULATORY_CARE_PROVIDER_SITE_OTHER): Payer: Medicare Other | Admitting: Family Medicine

## 2023-05-14 ENCOUNTER — Other Ambulatory Visit: Payer: Self-pay | Admitting: Neurology

## 2023-05-14 ENCOUNTER — Encounter: Payer: Self-pay | Admitting: Family Medicine

## 2023-05-14 VITALS — BP 113/74 | HR 61 | Temp 97.6°F | Resp 12 | Ht 63.0 in | Wt 157.6 lb

## 2023-05-14 DIAGNOSIS — K5100 Ulcerative (chronic) pancolitis without complications: Secondary | ICD-10-CM | POA: Diagnosis not present

## 2023-05-14 DIAGNOSIS — R7303 Prediabetes: Secondary | ICD-10-CM | POA: Diagnosis not present

## 2023-05-14 DIAGNOSIS — E042 Nontoxic multinodular goiter: Secondary | ICD-10-CM

## 2023-05-14 DIAGNOSIS — G4733 Obstructive sleep apnea (adult) (pediatric): Secondary | ICD-10-CM

## 2023-05-14 DIAGNOSIS — H6993 Unspecified Eustachian tube disorder, bilateral: Secondary | ICD-10-CM

## 2023-05-14 DIAGNOSIS — E559 Vitamin D deficiency, unspecified: Secondary | ICD-10-CM | POA: Diagnosis not present

## 2023-05-14 DIAGNOSIS — E782 Mixed hyperlipidemia: Secondary | ICD-10-CM

## 2023-05-14 DIAGNOSIS — Z0000 Encounter for general adult medical examination without abnormal findings: Secondary | ICD-10-CM | POA: Diagnosis not present

## 2023-05-14 DIAGNOSIS — M899 Disorder of bone, unspecified: Secondary | ICD-10-CM

## 2023-05-14 MED ORDER — ONETOUCH DELICA LANCETS 33G MISC: 1 refills | Status: AC

## 2023-05-14 MED ORDER — ONETOUCH ULTRA TEST VI STRP: ORAL_STRIP | 12 refills | Status: AC

## 2023-05-14 MED ORDER — ONETOUCH ULTRA 2 W/DEVICE KIT: PACK | 0 refills | Status: AC

## 2023-05-14 NOTE — Assessment & Plan Note (Signed)
Recommend low carb diet °Recheck A1c  °

## 2023-05-14 NOTE — Assessment & Plan Note (Signed)
Recheck TSH 

## 2023-05-14 NOTE — Assessment & Plan Note (Signed)
Reviewed last lipid panel Not currently on a statin Recheck FLP and CMP Discussed diet and exercise  

## 2023-05-14 NOTE — Assessment & Plan Note (Signed)
Followed by [facility] On Stelara Check CBC

## 2023-05-14 NOTE — Progress Notes (Signed)
Complete physical exam  Patient: Seda Tischner   DOB: 21-Nov-1955   67 y.o. Female  MRN: [id number]  Subjective:    Chief Complaint  Patient presents with   Annual Exam    Gavriella Stratford is a 67 y.o. female who presents today for a complete physical exam. She reports consuming a general diet.  She generally feels well. She reports sleeping well. She does not have additional problems to discuss today.   Discussed the use of AI scribe software for clinical note transcription with the patient, who gave verbal consent to proceed.  History of Present Illness   The patient presents for a physical exam and reports recent ear pain and pressure, sneezing, and nasal congestion. They suspect these symptoms may be due to sinus issues. They also report a throbbing sensation in the back of their head. They have a history of similar symptoms, which have previously been managed with over-the-counter medications.  In addition, the patient mentions a lipoma for which they are scheduled to have an ultrasound. They also have a mammogram scheduled for the near future. They express concerns about vaccinations, particularly the shingles and flu vaccines, due to COVID-19. They also mention a nerve study with Dr. Clelia Croft.  The patient also reports a history of ulcerative colitis, for which they take Stelara. They mention recent issues with increased gas and air, which they suspect may be due to dietary changes. They have noticed a slight weight loss, which they attribute to a reduction in sugar and carbohydrate intake.  Lastly, the patient mentions a desire to monitor their blood sugar levels at home due to a tendency for low blood sugar. They request a home testing kit and supplies.        Most recent fall risk assessment:    05/13/2023    9:09 AM  Fall Risk   Falls in the past year? 0  Number falls in past yr: 0  Injury with Fall? 0  Risk for fall due to : No Fall Risks  Follow up Falls evaluation completed      Most recent depression screenings:    05/13/2023    9:09 AM 01/22/2023   10:57 AM  PHQ 2/9 Scores  PHQ - 2 Score 1 0  PHQ- 9 Score 4 6        Patient Care Team: Erasmo Downer, MD as PCP - General (Family Medicine)   Outpatient Medications Prior to Visit  Medication Sig   acetaminophen (TYLENOL) 325 MG tablet Take by mouth.   citalopram (CELEXA) 40 MG tablet TAKE 1 TABLET(40 MG) BY MOUTH DAILY   Melatonin 5 MG SUBL Place 5 mg under the tongue at bedtime as needed.   omeprazole (PRILOSEC) 20 MG capsule TAKE 1 CAPSULE(20 MG) BY MOUTH DAILY   ustekinumab (STELARA) 90 MG/ML SOSY injection 90mg  subcutaneous every 8weeks   No facility-administered medications prior to visit.    ROS     Objective:     BP 113/74 (BP Location: Right Arm, Patient Position: Sitting, Cuff Size: Large)   Pulse 61   Temp 97.6 F (36.4 C) (Temporal)   Resp 12   Ht 5\' 3"  (1.6 m)   Wt 157 lb 9.6 oz (71.5 kg)   SpO2 97%   BMI 27.92 kg/m    Physical Exam Vitals reviewed.  Constitutional:      General: She is not in acute distress.    Appearance: Normal appearance. She is well-developed. She is not diaphoretic.  HENT:  Head: Normocephalic and atraumatic.     Right Ear: Tympanic membrane, ear canal and external ear normal.     Left Ear: Tympanic membrane, ear canal and external ear normal.     Nose: Nose normal.     Mouth/Throat:     Mouth: Mucous membranes are moist.     Pharynx: Oropharynx is clear. No oropharyngeal exudate.  Eyes:     General: No scleral icterus.    Conjunctiva/sclera: Conjunctivae normal.     Pupils: Pupils are equal, round, and reactive to light.  Neck:     Thyroid: No thyromegaly.  Cardiovascular:     Rate and Rhythm: Normal rate and regular rhythm.     Heart sounds: Normal heart sounds. No murmur heard. Pulmonary:     Effort: Pulmonary effort is normal. No respiratory distress.     Breath sounds: Normal breath sounds. No wheezing or rales.   Abdominal:     General: There is no distension.     Palpations: Abdomen is soft.     Tenderness: There is no abdominal tenderness.  Musculoskeletal:        General: No deformity.     Cervical back: Neck supple.     Right lower leg: No edema.     Left lower leg: No edema.  Lymphadenopathy:     Cervical: No cervical adenopathy.  Skin:    General: Skin is warm and dry.     Findings: No rash.  Neurological:     Mental Status: She is alert and oriented to person, place, and time. Mental status is at baseline.     Gait: Gait normal.  Psychiatric:        Mood and Affect: Mood normal.        Behavior: Behavior normal.        Thought Content: Thought content normal.      No results found for any visits on 05/14/23.     Assessment & Plan:    Routine Health Maintenance and Physical Exam  Immunization History  Administered Date(s) Administered   Influenza Split 08/11/2012   Influenza,inj,Quad PF,6+ Mos 06/06/2018   Influenza,trivalent, recombinat, inj, PF 08/11/2012   PPD Test 05/16/2018   Pneumococcal Conjugate-13 08/28/2018   Pneumococcal Polysaccharide-23 04/08/2020   Tdap 05/23/2010    Health Maintenance  Topic Date Due   COVID-19 Vaccine (1) Never done   Zoster Vaccines- Shingrix (1 of 2) Never done   DTaP/Tdap/Td (2 - Td or Tdap) 05/23/2020   MAMMOGRAM  04/27/2023   INFLUENZA VACCINE  12/23/2023 (Originally 04/25/2023)   Medicare Annual Wellness (AWV)  08/31/2023   Colonoscopy  01/04/2032   Pneumonia Vaccine 11+ Years old  Completed   DEXA SCAN  Completed   Hepatitis C Screening  Completed   HPV VACCINES  Aged Out    Discussed health benefits of physical activity, and encouraged her to engage in regular exercise appropriate for her age and condition.  Problem List Items Addressed This Visit       Respiratory   Obstructive sleep apnea     Digestive   Chronic universal ulcerative colitis (HCC)    Followed by [facility] On Stelara Check CBC      Relevant  Orders   CBC w/Diff/Platelet     Endocrine   Multinodular goiter    Recheck TSH      Relevant Orders   TSH     Other   Prediabetes    Recommend low carb diet Recheck A1c  Relevant Medications   Blood Glucose Monitoring Suppl (ONE TOUCH ULTRA 2) w/Device KIT   OneTouch Delica Lancets 33G MISC   glucose blood (ONETOUCH ULTRA TEST) test strip   Other Relevant Orders   Hemoglobin A1c   Mixed hyperlipidemia    Reviewed last lipid panel Not currently on a statin Recheck FLP and CMP Discussed diet and exercise       Relevant Orders   Comprehensive metabolic panel   Lipid panel   Other Visit Diagnoses     Encounter for annual physical exam    -  Primary   Relevant Orders   Hemoglobin A1c   Comprehensive metabolic panel   Lipid panel   VITAMIN D 25 Hydroxy (Vit-D Deficiency, Fractures)   CBC w/Diff/Platelet   Vitamin D deficiency       Relevant Orders   VITAMIN D 25 Hydroxy (Vit-D Deficiency, Fractures)   Dysfunction of both eustachian tubes               Sinusitis Ear pain and pressure likely secondary to Eustachian tube dysfunction from sinus congestion. -Recommend over-the-counter Flonase nasal spray. -Recommend over-the-counter Claritin for additional symptom relief.  Breast Health Scheduled mammogram on September 4th. -Continue with planned mammogram.  Vaccinations Discussed benefits and risks of Shingrix and tetanus vaccines. Patient has concerns about vaccines due to COVID-19 vaccine rollout. -Recommend Shingrix vaccine, available at pharmacy. -Recommend tetanus vaccine, available at pharmacy.  Ulcerative Colitis Stable on Stelara. Recent increase in gas, possibly related to dietary changes or underlying disease. -Continue Stelara as prescribed.  Prediabetes Patient reports occasional low blood sugar readings. -Order OneTouch Ultra glucose monitoring system and supplies. Check coverage with Medicare. -Check blood sugar at home as  needed.  General Health Maintenance -Order routine labs including kidney and liver function, blood counts, and vitamin D. -Consider adding thyroid function tests due to history of thyroid nodules -Schedule annual physical exam.        Return in about 1 year (around 05/13/2024) for CPE.     Shirlee Latch, MD

## 2023-05-15 ENCOUNTER — Ambulatory Visit
Admission: RE | Admit: 2023-05-15 | Discharge: 2023-05-15 | Disposition: A | Discharge status: Home / Self Care | Payer: Medicare Other | Source: Ambulatory Visit | Attending: Neurology | Admitting: Neurology

## 2023-05-15 DIAGNOSIS — M792 Neuralgia and neuritis, unspecified: Secondary | ICD-10-CM | POA: Diagnosis not present

## 2023-05-15 DIAGNOSIS — M79605 Pain in left leg: Secondary | ICD-10-CM | POA: Diagnosis not present

## 2023-05-15 LAB — CBC WITH DIFFERENTIAL/PLATELET
Basophils Absolute: 0.1 10*3/uL (ref 0.0–0.2)
Basos: 2 %
EOS (ABSOLUTE): 0.2 10*3/uL (ref 0.0–0.4)
Eos: 4 %
Hematocrit: 40.2 % (ref 34.0–46.6)
Hemoglobin: 13.1 g/dL (ref 11.1–15.9)
Immature Grans (Abs): 0 10*3/uL (ref 0.0–0.1)
Immature Granulocytes: 0 %
Lymphocytes Absolute: 1.7 10*3/uL (ref 0.7–3.1)
Lymphs: 35 %
MCH: 28.9 pg (ref 26.6–33.0)
MCHC: 32.6 g/dL (ref 31.5–35.7)
MCV: 89 fL (ref 79–97)
Monocytes Absolute: 0.5 10*3/uL (ref 0.1–0.9)
Monocytes: 9 %
Neutrophils Absolute: 2.5 10*3/uL (ref 1.4–7.0)
Neutrophils: 50 %
Platelets: 278 10*3/uL (ref 150–450)
RBC: 4.54 x10E6/uL (ref 3.77–5.28)
RDW: 12.7 % (ref 11.7–15.4)
WBC: 4.9 10*3/uL (ref 3.4–10.8)

## 2023-05-15 LAB — LIPID PANEL
Chol/HDL Ratio: 5.6 ratio — ABNORMAL HIGH (ref 0.0–4.4)
Cholesterol, Total: 222 mg/dL — ABNORMAL HIGH (ref 100–199)
HDL: 40 mg/dL (ref >39)
LDL Chol Calc (NIH): 162 mg/dL — ABNORMAL HIGH (ref 0–99)
Triglycerides: 112 mg/dL (ref 0–149)
VLDL Cholesterol Cal: 20 mg/dL (ref 5–40)

## 2023-05-15 LAB — COMPREHENSIVE METABOLIC PANEL
ALT: 11 IU/L (ref 0–32)
AST: 14 IU/L (ref 0–40)
Albumin: 4 g/dL (ref 3.9–4.9)
Alkaline Phosphatase: 79 IU/L (ref 44–121)
BUN/Creatinine Ratio: 31 — ABNORMAL HIGH (ref 12–28)
BUN: 25 mg/dL (ref 8–27)
Bilirubin Total: 0.4 mg/dL (ref 0.0–1.2)
CO2: 22 mmol/L (ref 20–29)
Calcium: 9.3 mg/dL (ref 8.7–10.3)
Chloride: 106 mmol/L (ref 96–106)
Creatinine, Ser: 0.81 mg/dL (ref 0.57–1.00)
Globulin, Total: 2.7 g/dL (ref 1.5–4.5)
Glucose: 104 mg/dL — ABNORMAL HIGH (ref 70–99)
Potassium: 4.6 mmol/L (ref 3.5–5.2)
Sodium: 139 mmol/L (ref 134–144)
Total Protein: 6.7 g/dL (ref 6.0–8.5)
eGFR: 79 mL/min/{1.73_m2} (ref >59)

## 2023-05-15 LAB — HEMOGLOBIN A1C
Est. average glucose Bld gHb Est-mCnc: 126 mg/dL
Hgb A1c MFr Bld: 6 % — ABNORMAL HIGH (ref 4.8–5.6)

## 2023-05-15 LAB — TSH: TSH: 3.07 u[IU]/mL (ref 0.450–4.500)

## 2023-05-15 LAB — VITAMIN D 25 HYDROXY (VIT D DEFICIENCY, FRACTURES): Vit D, 25-Hydroxy: 26.7 ng/mL — ABNORMAL LOW (ref 30.0–100.0)

## 2023-05-17 ENCOUNTER — Ambulatory Visit
Admission: RE | Admit: 2023-05-17 | Discharge: 2023-05-17 | Disposition: A | Discharge status: Home / Self Care | Payer: Medicare Other | Source: Ambulatory Visit | Attending: Neurology | Admitting: Neurology

## 2023-05-17 DIAGNOSIS — M792 Neuralgia and neuritis, unspecified: Secondary | ICD-10-CM | POA: Diagnosis not present

## 2023-05-17 DIAGNOSIS — D1809 Hemangioma of other sites: Secondary | ICD-10-CM | POA: Diagnosis not present

## 2023-05-17 DIAGNOSIS — M899 Disorder of bone, unspecified: Secondary | ICD-10-CM | POA: Insufficient documentation

## 2023-05-17 DIAGNOSIS — D1802 Hemangioma of intracranial structures: Secondary | ICD-10-CM | POA: Diagnosis not present

## 2023-05-17 DIAGNOSIS — I7090 Unspecified atherosclerosis: Secondary | ICD-10-CM | POA: Diagnosis not present

## 2023-05-17 DIAGNOSIS — I672 Cerebral atherosclerosis: Secondary | ICD-10-CM | POA: Diagnosis not present

## 2023-05-28 DIAGNOSIS — K5190 Ulcerative colitis, unspecified, without complications: Secondary | ICD-10-CM | POA: Diagnosis not present

## 2023-05-29 ENCOUNTER — Ambulatory Visit
Admission: RE | Admit: 2023-05-29 | Discharge: 2023-05-29 | Disposition: A | Discharge status: Home / Self Care | Payer: Medicare Other | Source: Ambulatory Visit | Attending: Family Medicine | Admitting: Family Medicine

## 2023-05-29 DIAGNOSIS — Z1231 Encounter for screening mammogram for malignant neoplasm of breast: Secondary | ICD-10-CM | POA: Insufficient documentation

## 2023-06-05 ENCOUNTER — Encounter: Payer: Self-pay | Admitting: Family Medicine

## 2023-06-05 ENCOUNTER — Ambulatory Visit (INDEPENDENT_AMBULATORY_CARE_PROVIDER_SITE_OTHER): Payer: Medicare Other | Admitting: Family Medicine

## 2023-06-05 VITALS — BP 110/75 | HR 60 | Ht 63.0 in | Wt 154.5 lb

## 2023-06-05 DIAGNOSIS — J069 Acute upper respiratory infection, unspecified: Secondary | ICD-10-CM | POA: Diagnosis not present

## 2023-06-05 NOTE — Patient Instructions (Addendum)
VISIT SUMMARY:  Dear [name], during your visit, we discussed your symptoms of upper respiratory infection, fluid in your right ear, and your ongoing chronic body aches. Your symptoms of cough, sore throat, runny nose, and body aches have been present for about a week and have worsened over the last two days. Tests for Strep and COVID were negative, and there were no signs of bacterial infection. We also noted fluid behind your right ear, which could be due to the viral infection or possible allergies. Your chronic body aches have not worsened or changed.  YOUR PLAN:  -UPPER RESPIRATORY INFECTION: This is a common illness that affects your nose, throat, and airways. It's usually caused by a virus. To help you feel better, I recommend supportive care including hydration, rest, and use of a humidifier. Over-the-counter medications such as Claritin, Allegra, or Zyrtec can help with your symptoms. I'm also prescribing Prednisone, a medication to reduce inflammation, for 5 days. If your symptoms worsen or persist beyond 10 days, please return for a follow-up.  -RIGHT EAR EFFUSION: This is a condition where fluid builds up behind your eardrum, often due to a viral infection or allergies. To help with this, I recommend using over-the-counter antihistamines and continuing the supportive care mentioned above.  INSTRUCTIONS:  Please ensure you stay hydrated, rest, and use a humidifier to help with your symptoms. You can also take over-the-counter medications such as Claritin, Allegra, or Zyrtec for symptomatic relief. If your symptoms worsen or persist beyond 10 days, please return for a follow-up.

## 2023-06-05 NOTE — Progress Notes (Signed)
Established patient visit   Patient: Karla [name]   DOB: [birth year]   67 y.o. Female  MRN: [id number] Visit Date: 06/05/2023  Today's healthcare provider: Ronnald Ramp, MD   Chief Complaint  Patient presents with   Medical Management of Chronic Issues    Sore throat, coughing, dry mouth, runny nose, sneezing, symptoms started 2 days ago, sneezing started 1 week ago, prior treatment has been tylenol, vitamin C, staying hydrated, throat lozenges, Tea    Subjective     HPI     Medical Management of Chronic Issues    Additional comments: Sore throat, coughing, dry mouth, runny nose, sneezing, symptoms started 2 days ago, sneezing started 1 week ago, prior treatment has been tylenol, vitamin C, staying hydrated, throat lozenges, Tea       Last edited by Rolly Salter, CMA on 06/05/2023  4:21 PM.       Discussed the use of AI scribe software for clinical note transcription with the patient, who gave verbal consent to proceed.  History of Present Illness   Karla [name], a patient with a history of chronic body aches, presents with a week-long history of upper respiratory symptoms. The symptoms began with sneezing and a runny nose, which progressed to a severe sore throat over the past two days. The patient has been managing the throat discomfort with Tylenol. Accompanying these symptoms, the patient reports feeling hot, although home temperature readings have been within normal limits.  The patient also reports a dry cough and some shortness of breath. In addition to these symptoms, the patient has experienced some nausea, which has affected her appetite. Despite the sore throat, the patient has been able to consume soft foods and hot tea, and plans to increase fluid intake.  The patient has a preference for home remedies and expresses a reluctance to use medications, including nasal sprays and antihistamines. Despite this, the patient has been using a humidifier for  symptom relief. The patient denies any facial pain when pressure is applied to the sinus areas.         Past Medical History:  Diagnosis Date   GERD (gastroesophageal reflux disease)    Insomnia    Restless leg syndrome    Ulcerative colitis (HCC)    Vertigo     Medications: Outpatient Medications Prior to Visit  Medication Sig   acetaminophen (TYLENOL) 325 MG tablet Take by mouth.   Blood Glucose Monitoring Suppl (ONE TOUCH ULTRA 2) w/Device KIT Use as directed   citalopram (CELEXA) 40 MG tablet TAKE 1 TABLET(40 MG) BY MOUTH DAILY   glucose blood (ONETOUCH ULTRA TEST) test strip Use as instructed to check blood sugar once daily   Melatonin 5 MG SUBL Place 5 mg under the tongue at bedtime as needed.   omeprazole (PRILOSEC) 20 MG capsule TAKE 1 CAPSULE(20 MG) BY MOUTH DAILY   OneTouch Delica Lancets 33G MISC Use as directed   ustekinumab (STELARA) 90 MG/ML SOSY injection 90mg  subcutaneous every 8weeks   No facility-administered medications prior to visit.    Review of Systems      Objective    BP 110/75 (BP Location: Left Arm, Patient Position: Sitting, Cuff Size: Normal)   Pulse 60   Ht 5\' 3"  (1.6 m)   Wt 154 lb 8 oz (70.1 kg)   SpO2 95%   BMI 27.37 kg/m     Physical Exam  Physical Exam   VITALS: T- 98.4 HEENT: Left tympanic membrane not red, not  bulging, no bubbles indicating no excess fluid. Right tympanic membrane not red, presence of bubbles indicating effusion. Tonsils enlarged, throat red and irritated. Nasal mucosa with mild erythema. Sinuses non-tender on palpation. NECK: Enlarged cervical lymph nodes. CHEST: Lungs clear to auscultation, no crackles, wheezing, or rhonchi.       No results found for any visits on 06/05/23.  Assessment & Plan     Problem List Items Addressed This Visit     Viral upper respiratory infection - Primary        Upper Respiratory Infection Symptoms of cough, sore throat, runny nose, and body aches for approximately  one week, worsening over the last two days. No signs of bacterial infection on examination. Strep and COVID tests negative. -Advise supportive care including hydration, rest, and use of a humidifier. -Recommend over-the-counter medications such as Claritin, Allegra, or Zyrtec for symptomatic relief. -Return as needed if symptoms worsen or persist beyond 10 days.  Right Ear Effusion Fluid noted behind the right tympanic membrane, likely due to viral infection or possible allergies. -Advise use of over-the-counter antihistamines for symptomatic relief. -Continue supportive care as above.      No follow-ups on file.         Ronnald Ramp, MD  [facility] [phone] (phone) [phone] (fax)  [facility]

## 2023-06-07 ENCOUNTER — Ambulatory Visit: Payer: Self-pay | Admitting: *Deleted

## 2023-06-07 ENCOUNTER — Other Ambulatory Visit: Payer: Self-pay | Admitting: Family Medicine

## 2023-06-07 MED ORDER — AZITHROMYCIN 250 MG PO TABS: ORAL_TABLET | ORAL | 0 refills | Status: AC

## 2023-06-07 NOTE — Telephone Encounter (Signed)
Message from Alessandra Bevels sent at 06/07/2023 10:01 AM EDT  Summary: follow up advice   Pt is calling to report that she was seen on 911/24 advised to call in on Friday to report how she was doing. Pt reports that she is doing the same with sore throat, runny nose, sneezing, with fatigue. Pt is requesting an antibiotic.          Call History  Contact Date/Time Type Contact Phone/Fax User  06/07/2023 09:59 AM EDT Phone (Incoming) Karla [name], Karla [name] (Self) [phone] Rexene Edison) Valora Piccolo

## 2023-06-07 NOTE — Telephone Encounter (Signed)
     Chief Complaint: Seen in office 06/05/23. Continued runny nose, sore throat, fatigue, low grade fever. Asking for antibiotic. Symptoms: Above Frequency: Last week Pertinent Negatives: Patient denies  Disposition: [] ED /[] Urgent Care (no appt availability in office) / [] Appointment(In office/virtual)/ []  [facility]/ [] Home Care/ [] Refused Recommended Disposition /[] [place] [facility]/ [x]  Follow-up with PCP Additional Notes: Please advise pt.  Reason for Disposition  Cough with cold symptoms (e.g., runny nose, postnasal drip, throat clearing)  Answer Assessment - Initial Assessment Questions 1. ONSET: "When did the cough begin?"      Seen 06/05/23  Last week 2. SEVERITY: "How bad is the cough today?"      Moderate 3. SPUTUM: "Describe the color of your sputum" (none, dry cough; clear, white, yellow, green)     Clear 4. HEMOPTYSIS: "Are you coughing up any blood?" If so ask: "How much?" (flecks, streaks, tablespoons, etc.)     No 5. DIFFICULTY BREATHING: "Are you having difficulty breathing?" If Yes, ask: "How bad is it?" (e.g., mild, moderate, severe)    - MILD: No SOB at rest, mild SOB with walking, speaks normally in sentences, can lie down, no retractions, pulse < 100.    - MODERATE: SOB at rest, SOB with minimal exertion and prefers to sit, cannot lie down flat, speaks in phrases, mild retractions, audible wheezing, pulse 100-120.    - SEVERE: Very SOB at rest, speaks in single words, struggling to breathe, sitting hunched forward, retractions, pulse > 120      Mild 6. FEVER: "Do you have a fever?" If Yes, ask: "What is your temperature, how was it measured, and when did it start?"     Low grade 7. CARDIAC HISTORY: "Do you have any history of heart disease?" (e.g., heart attack, congestive heart failure)      No 8. LUNG HISTORY: "Do you have any history of lung disease?"  (e.g., pulmonary embolus, asthma, emphysema)     No 9. PE RISK FACTORS: "Do you have a  history of blood clots?" (or: recent major surgery, recent prolonged travel, bedridden)     No 10. OTHER SYMPTOMS: "Do you have any other symptoms?" (e.g., runny nose, wheezing, chest pain)       Sore throat, runny nose, fatigue 11. PREGNANCY: "Is there any chance you are pregnant?" "When was your last menstrual period?"       No 12. TRAVEL: "Have you traveled out of the country in the last month?" (e.g., travel history, exposures)       No  Protocols used: Cough - Acute Non-Productive-A-AH

## 2023-06-07 NOTE — Telephone Encounter (Signed)
Attempted to return her call.   Left a voicemail to call back.

## 2023-06-11 DIAGNOSIS — R200 Anesthesia of skin: Secondary | ICD-10-CM | POA: Diagnosis not present

## 2023-07-09 DIAGNOSIS — K5190 Ulcerative colitis, unspecified, without complications: Secondary | ICD-10-CM | POA: Diagnosis not present

## 2023-07-25 ENCOUNTER — Other Ambulatory Visit: Payer: Self-pay | Admitting: Family Medicine

## 2023-07-25 DIAGNOSIS — F3289 Other specified depressive episodes: Secondary | ICD-10-CM

## 2023-07-25 NOTE — Telephone Encounter (Signed)
Requested Prescriptions  Pending Prescriptions Disp Refills   citalopram (CELEXA) 40 MG tablet [Pharmacy Med Name: CITALOPRAM 40MG  TABLETS] 90 tablet 1    Sig: TAKE 1 TABLET(40 MG) BY MOUTH DAILY     Psychiatry:  Antidepressants - SSRI Passed - 07/25/2023 10:14 AM      Passed - Completed PHQ-2 or PHQ-9 in the last 360 days      Passed - Valid encounter within last 6 months    Recent Outpatient Visits           1 month ago Viral upper respiratory infection   [place] [facility] Simmons[place]Robinson, [place], MD   2 months ago Encounter for annual physical exam   [place] [place], Marzella Schlein, MD   2 months ago Acute nonintractable headache, unspecified headache type   [facility] Bery[place] Flock, Marzella Schlein, MD   6 months ago [place] and fatigue   [place] [facility] [place], Marzella Schlein, MD   7 months ago Sore throat   [place] [facility] [place], [place], PA-C       Future Appointments             In 9 months Bacigalupo, Marzella Schlein, MD [facility]

## 2023-07-31 MED ORDER — OMEPRAZOLE 20 MG CAPSULE,DELAYED RELEASE
ORAL_CAPSULE | 3 refills | 0 days | Status: CP
Start: 2023-07-31 — End: ?

## 2023-08-05 DIAGNOSIS — M899 Disorder of bone, unspecified: Secondary | ICD-10-CM | POA: Diagnosis not present

## 2023-08-05 DIAGNOSIS — M792 Neuralgia and neuritis, unspecified: Secondary | ICD-10-CM | POA: Diagnosis not present

## 2023-08-05 DIAGNOSIS — R200 Anesthesia of skin: Secondary | ICD-10-CM | POA: Diagnosis not present

## 2023-08-20 DIAGNOSIS — K5190 Ulcerative colitis, unspecified, without complications: Secondary | ICD-10-CM | POA: Diagnosis not present

## 2023-09-03 ENCOUNTER — Ambulatory Visit (INDEPENDENT_AMBULATORY_CARE_PROVIDER_SITE_OTHER): Payer: Medicare Other

## 2023-09-03 DIAGNOSIS — Z0000 Encounter for general adult medical examination without abnormal findings: Secondary | ICD-10-CM

## 2023-09-03 NOTE — Patient Instructions (Addendum)
Ms. Arballo , Thank you for taking time to come for your Medicare Wellness Visit. I appreciate your ongoing commitment to your health goals. Please review the following plan we discussed and let me know if I can assist you in the future.   Referrals/Orders/Follow-Ups/Clinician Recommendations: NONE  This is a list of the screening recommended for you and due dates:  Health Maintenance  Topic Date Due   COVID-19 Vaccine (1) Never done   Zoster (Shingles) Vaccine (1 of 2) Never done   DTaP/Tdap/Td vaccine (2 - Td or Tdap) 05/23/2020   Flu Shot  12/23/2023*   Mammogram  05/28/2024   Medicare Annual Wellness Visit  09/02/2024   Colon Cancer Screening  01/04/2032   Pneumonia Vaccine  Completed   DEXA scan (bone density measurement)  Completed   Hepatitis C Screening  Completed   HPV Vaccine  Aged Out  *Topic was postponed. The date shown is not the original due date.    Advanced directives: (ACP Link)Information on Advanced Care Planning can be found at [facility] Advance Health Care Directives Advance Health Care Directives ([email or web address]   Next Medicare Annual Wellness Visit scheduled for next year: Yes   09/08/24 @ 1:50 PM IN PERSON

## 2023-09-03 NOTE — Progress Notes (Signed)
Subjective:   Karla [name] is a 67 y.o. female who presents for Medicare Annual (Subsequent) preventive examination.  Visit Complete: Virtual I connected with  Abel Presto on 09/03/23 by a audio enabled telemedicine application and verified that I am speaking with the correct person using two identifiers.  Patient Location: Home  Provider Location: Office/Clinic  I discussed the limitations of evaluation and management by telemedicine. The patient expressed understanding and agreed to proceed.  Vital Signs: Because this visit was a virtual/telehealth visit, some criteria may be missing or patient reported. Any vitals not documented were not able to be obtained and vitals that have been documented are patient reported.  Cardiac Risk Factors include: advanced age (>52men, >65 women);dyslipidemia     Objective:    Today's Vitals   09/03/23 1341  PainSc: 4    There is no height or weight on file to calculate BMI.     09/03/2023    1:52 PM 08/30/2022    1:39 PM 03/31/2020    3:04 PM 03/27/2020    6:27 PM 05/05/2018   12:58 PM  Advanced Directives  Does Patient Have a Medical Advance Directive? No No No No No  Would patient like information on creating a medical advance directive? No - Patient declined No - Patient declined No - Patient declined      Current Medications (verified) Outpatient Encounter Medications as of 09/03/2023  Medication Sig   acetaminophen (TYLENOL) 325 MG tablet Take by mouth.   Blood Glucose Monitoring Suppl (ONE TOUCH ULTRA 2) w/Device KIT Use as directed   citalopram (CELEXA) 40 MG tablet TAKE 1 TABLET(40 MG) BY MOUTH DAILY   Collagen-Vitamin C-Biotin (COLLAGEN PO) Take by mouth.   glucose blood (ONETOUCH ULTRA TEST) test strip Use as instructed to check blood sugar once daily   omeprazole (PRILOSEC) 20 MG capsule TAKE 1 CAPSULE(20 MG) BY MOUTH DAILY   OneTouch Delica Lancets 33G MISC Use as directed   ustekinumab (STELARA) 90 MG/ML SOSY injection  90mg  subcutaneous every 8weeks   Melatonin 5 MG SUBL Place 5 mg under the tongue at bedtime as needed. (Patient not taking: Reported on 09/03/2023)   No facility-administered encounter medications on file as of 09/03/2023.    Allergies (verified) Mesalamine, Prednisone, Amoxicillin, and Metronidazole   History: Past Medical History:  Diagnosis Date   GERD (gastroesophageal reflux disease)    Insomnia    Restless leg syndrome    Ulcerative colitis (HCC)    Vertigo    Past Surgical History:  Procedure Laterality Date   EXTRACORPOREAL SHOCK WAVE LITHOTRIPSY Left 03/31/2020   Procedure: EXTRACORPOREAL SHOCK WAVE LITHOTRIPSY (ESWL);  Surgeon: Vanna Scotland, MD;  Location: [facility] ORS;  Service: Urology;  Laterality: Left;   NECK SURGERY     SHOULDER SURGERY Left    Family History  Problem Relation Age of Onset   Congestive Heart Failure Mother    COPD Mother    Macular degeneration Mother    Vitiligo Mother    Arthritis Father    Diabetes Father    Breast cancer Paternal Grandmother    Social History   Socioeconomic History   Marital status: Single    Spouse name: Not on file   Number of children: Not on file   Years of education: Not on file   Highest education level: Not on file  Occupational History   Not on file  Tobacco Use   Smoking status: Never    Passive exposure: Never   Smokeless tobacco: Never  Vaping Use   Vaping status: Never Used  Substance and Sexual Activity   Alcohol use: Never   Drug use: Never   Sexual activity: Not Currently  Other Topics Concern   Not on file  Social History Narrative   Not on file   Social Determinants of Health   Financial Resource Strain: Low Risk  (09/03/2023)   Overall Financial Resource Strain (CARDIA)    Difficulty of Paying Living Expenses: Not hard at all  Food Insecurity: No Food Insecurity (09/03/2023)   Hunger Vital Sign    Worried About Running Out of Food in the Last Year: Never true    Ran Out of Food in  the Last Year: Never true  Transportation Needs: No Transportation Needs (09/03/2023)   PRAPARE - Administrator, Civil Service (Medical): No    Lack of Transportation (Non-Medical): No  Physical Activity: Insufficiently Active (09/03/2023)   Exercise Vital Sign    Days of Exercise per Week: 3 days    Minutes of Exercise per Session: 30 min  Stress: No Stress Concern Present (09/03/2023)   [employer] of Occupational Health - Occupational Stress Questionnaire    Feeling of Stress : Only a little  Social Connections: Socially Isolated (09/03/2023)   Social Connection and Isolation Panel [NHANES]    Frequency of Communication with Friends and Family: More than three times a week    Frequency of Social Gatherings with Friends and Family: More than three times a week    Attends Religious Services: Never    Database administrator or Organizations: No    Attends Engineer, structural: Never    Marital Status: Divorced    Tobacco Counseling Counseling given: Not Answered   Clinical Intake:  Pre-visit preparation completed: Yes  Pain : 0-10 Pain Score: 4  Pain Type: Chronic pain Pain Location: Wrist Pain Orientation: Right Pain Descriptors / Indicators: Aching, Pins and needles Pain Onset: More than a month ago Pain Frequency: Constant Pain Relieving Factors: SPLINT  Pain Relieving Factors: SPLINT  BMI - recorded: 27.3 Nutritional Status: BMI 25 -29 Overweight Nutritional Risks: None Diabetes: No  How often do you need to have someone help you when you read instructions, pamphlets, or other written materials from your doctor or pharmacy?: 1 - Never  Interpreter Needed?: No  Information entered by :: Kennedy Bucker, LPN   Activities of Daily Living    09/03/2023    1:57 PM 01/22/2023   10:57 AM  In your present state of health, do you have any difficulty performing the following activities:  Hearing? 0 0  Vision? 0 0  Difficulty  concentrating or making decisions? 0 1  Walking or climbing stairs? 0 0  Dressing or bathing? 0 0  Doing errands, shopping? 0 0  Preparing Food and eating ? N   Using the Toilet? N   In the past six months, have you accidently leaked urine? N   Do you have problems with loss of bowel control? N   Managing your Medications? N   Managing your Finances? N   Housekeeping or managing your Housekeeping? N     Patient Care Team: Erasmo Downer, MD as PCP - [facility]  Indicate any recent Medical Services you may have received from other than [facility] providers in the past year (date may be approximate).     Assessment:   This is a routine wellness examination for [place].  Hearing/Vision screen  Hearing Screening - Comments:: NO AIDS Vision Screening - Comments:: DRIVING GLASSES- DR.WOODARD   Goals Addressed             This Visit's Progress    DIET - INCREASE WATER INTAKE         Depression Screen    09/03/2023    1:54 PM 05/13/2023    9:09 AM 01/22/2023   10:57 AM 08/30/2022    1:38 PM 07/05/2022    1:25 PM 01/23/2022    4:20 PM 07/25/2021    9:15 AM  PHQ 2/9 Scores  PHQ - 2 Score 1 1 0 3 1 2 1   PHQ- 9 Score 3 4 6 5  5 5     Fall Risk    09/03/2023    1:57 PM 05/13/2023    9:09 AM 01/22/2023   10:56 AM 08/30/2022    1:40 PM 08/26/2022    5:13 PM  Fall Risk   Falls in the past year? 0 0 1 0 0  Number falls in past yr: 0 0 0 0 0  Injury with Fall? 0 0 0 0 0  Risk for fall due to : No Fall Risks No Fall Risks History of fall(s) No Fall Risks   Follow up Falls prevention discussed;Falls evaluation completed Falls evaluation completed Falls evaluation completed Falls prevention discussed;Falls evaluation completed     MEDICARE RISK AT HOME: Medicare Risk at Home Any stairs in or around the home?: No If so, are there any without handrails?: No Home free of loose throw rugs in walkways, pet beds, electrical cords, etc?:  Yes Adequate lighting in your home to reduce risk of falls?: Yes Life alert?: No Use of a cane, walker or w/c?: No Grab bars in the bathroom?: Yes Shower chair or bench in shower?: Yes Elevated toilet seat or a handicapped toilet?: Yes  TIMED UP AND GO:  Was the test performed?  No    Cognitive Function:        09/03/2023    1:59 PM 08/30/2022    1:42 PM 04/08/2020   11:30 AM  6CIT Screen  What Year? 0 points 0 points 0 points  What month? 0 points 0 points 0 points  What time? 3 points 0 points 0 points  Count back from 20 0 points 0 points 0 points  Months in reverse 0 points 0 points 0 points  Repeat phrase 0 points 0 points 4 points  Total Score 3 points 0 points 4 points    Immunizations Immunization History  Administered Date(s) Administered   Influenza Split 08/11/2012   Influenza,inj,Quad PF,6+ Mos 06/06/2018   Influenza,trivalent, recombinat, inj, PF 08/11/2012   PPD Test 05/16/2018   Pneumococcal Conjugate-13 08/28/2018   Pneumococcal Polysaccharide-23 04/08/2020   Tdap 05/23/2010    TDAP status: Due, Education has been provided regarding the importance of this vaccine. Advised may receive this vaccine at local pharmacy or Health Dept. Aware to provide a copy of the vaccination record if obtained from local pharmacy or Health Dept. Verbalized acceptance and understanding.  Flu Vaccine status: Declined, Education has been provided regarding the importance of this vaccine but patient still declined. Advised may receive this vaccine at local pharmacy or Health Dept. Aware to provide a copy of the vaccination record if obtained from local pharmacy or Health Dept. Verbalized acceptance and understanding.  Pneumococcal vaccine status: Up to date  Covid-19 vaccine status: Declined, Education has been provided regarding the importance of this vaccine but patient still declined.  Advised may receive this vaccine at local pharmacy or Health Dept.or vaccine clinic. Aware  to provide a copy of the vaccination record if obtained from local pharmacy or Health Dept. Verbalized acceptance and understanding.  Qualifies for Shingles Vaccine? Yes   Zostavax completed No   Shingrix Completed?: No.    Education has been provided regarding the importance of this vaccine. Patient has been advised to call insurance company to determine out of pocket expense if they have not yet received this vaccine. Advised may also receive vaccine at local pharmacy or Health Dept. Verbalized acceptance and understanding.  Screening Tests Health Maintenance  Topic Date Due   COVID-19 Vaccine (1) Never done   Zoster Vaccines- Shingrix (1 of 2) Never done   DTaP/Tdap/Td (2 - Td or Tdap) 05/23/2020   INFLUENZA VACCINE  12/23/2023 (Originally 04/25/2023)   MAMMOGRAM  05/28/2024   Medicare Annual Wellness (AWV)  09/02/2024   Colonoscopy  01/04/2032   Pneumonia Vaccine 75+ Years old  Completed   DEXA SCAN  Completed   Hepatitis C Screening  Completed   HPV VACCINES  Aged Out    Health Maintenance  Health Maintenance Due  Topic Date Due   COVID-19 Vaccine (1) Never done   Zoster Vaccines- Shingrix (1 of 2) Never done   DTaP/Tdap/Td (2 - Td or Tdap) 05/23/2020    Colorectal cancer screening: Type of screening: Colonoscopy. Completed 01/03/22. Repeat every 5 years  Mammogram status: Completed 05/29/23. Repeat every year  Bone Density status: Completed 03/04/23. Results reflect: Bone density results: OSTEOPOROSIS. Repeat every 2 years.  Lung Cancer Screening: (Low Dose CT Chest recommended if Age 60-80 years, 20 pack-year currently smoking OR have quit w/in 15years.) does not qualify.    Additional Screening:  Hepatitis C Screening: does qualify; Completed 03/23/21  Vision Screening: Recommended annual ophthalmology exams for early detection of glaucoma and other disorders of the eye. Is the patient up to date with their annual eye exam?  Yes  Who is the provider or what is the name  of the office in which the patient attends annual eye exams? DR.WOODARD If pt is not established with a provider, would they like to be referred to a provider to establish care? No .   Dental Screening: Recommended annual dental exams for proper oral hygiene   Community Resource Referral / Chronic Care Management: CRR required this visit?  No   CCM required this visit?  No     Plan:     I have personally reviewed and noted the following in the patient's chart:   Medical and social history Use of alcohol, tobacco or illicit drugs  Current medications and supplements including opioid prescriptions. Patient is not currently taking opioid prescriptions. Functional ability and status Nutritional status Physical activity Advanced directives List of other physicians Hospitalizations, surgeries, and ER visits in previous 12 months Vitals Screenings to include cognitive, depression, and falls Referrals and appointments  In addition, I have reviewed and discussed with patient certain preventive protocols, quality metrics, and best practice recommendations. A written personalized care plan for preventive services as well as general preventive health recommendations were provided to patient.     Hal Hope, LPN   16/06/9603   After Visit Summary: (MyChart) Due to this being a telephonic visit, the after visit summary with patients personalized plan was offered to patient via MyChart   Nurse Notes: NONE

## 2023-10-01 DIAGNOSIS — K5190 Ulcerative colitis, unspecified, without complications: Secondary | ICD-10-CM | POA: Diagnosis not present

## 2023-10-04 DIAGNOSIS — K5190 Ulcerative colitis, unspecified, without complications: Secondary | ICD-10-CM | POA: Diagnosis not present

## 2023-10-24 DIAGNOSIS — K080 Exfoliation of teeth due to systemic causes: Secondary | ICD-10-CM | POA: Diagnosis not present

## 2023-11-13 DIAGNOSIS — K5190 Ulcerative colitis, unspecified, without complications: Secondary | ICD-10-CM | POA: Diagnosis not present

## 2023-11-15 DIAGNOSIS — K5190 Ulcerative colitis, unspecified, without complications: Secondary | ICD-10-CM | POA: Diagnosis not present

## 2023-11-29 ENCOUNTER — Ambulatory Visit: Payer: Self-pay | Admitting: Family Medicine

## 2023-11-29 ENCOUNTER — Encounter: Payer: Self-pay | Admitting: Family Medicine

## 2023-11-29 ENCOUNTER — Ambulatory Visit: Admitting: Family Medicine

## 2023-11-29 VITALS — BP 107/70 | HR 57 | Temp 98.1°F | Ht 63.0 in | Wt 158.1 lb

## 2023-11-29 DIAGNOSIS — R0989 Other specified symptoms and signs involving the circulatory and respiratory systems: Secondary | ICD-10-CM

## 2023-11-29 DIAGNOSIS — U071 COVID-19: Secondary | ICD-10-CM | POA: Diagnosis not present

## 2023-11-29 LAB — POC COVID19/FLU A&B COMBO
Covid Antigen, POC: POSITIVE — AB
Influenza A Antigen, POC: NEGATIVE
Influenza B Antigen, POC: NEGATIVE

## 2023-11-29 MED ORDER — NIRMATRELVIR/RITONAVIR (PAXLOVID)TABLET: 3.0000 | ORAL_TABLET | Freq: Two times a day (BID) | ORAL | 0 refills | Status: AC

## 2023-11-29 NOTE — Progress Notes (Signed)
 Acute Office Visit  Introduced to nurse practitioner role and practice setting.  All questions answered.  Discussed provider/patient relationship and expectations.   Subjective:     Patient ID: Karla [name], female    DOB: [birth year], 68 y.o.   MRN: [id number]  Chief Complaint  Patient presents with   URI    Started around Monday night Just started coughing up a little bit of flem this morning Has had a fever Has taken tylenol for headaches.    The patient presents with symptoms of URI.  Symptoms began on Monday following a weekend trip out of town. Initially, she experienced frequent sneezing, which progressed to include a headache, sore throat, and elevated temperature. Despite feeling unwell, she continued to work on Monday and Tuesday but called out on Wednesday due to worsening symptoms. Today, she started coughing up a small amount of mucus, although her nasal discharge remains clear.  She has been managing her symptoms with Tylenol and increased fluid intake. Her headache persists, and she has been using a cold washcloth on her forehead and heat on the back of her head for relief. She has not taken any medications other than Tylenol, such as Mucinex, for her symptoms. She reports a slight swelling in her throat and mild diarrhea, which she attributes to taking zinc supplements.   URI  This is a new problem. The current episode started in the past 7 days. The problem has been gradually worsening. The maximum temperature recorded prior to her arrival was 100.4 - 100.9 F. The fever has been present for 1 to 2 days. Associated symptoms include congestion, coughing, diarrhea, headaches, rhinorrhea, sneezing, a sore throat and swollen glands. Pertinent negatives include no abdominal pain, chest pain, nausea, neck pain, plugged ear sensation, rash, vomiting or wheezing. She has tried acetaminophen for the symptoms. The treatment provided mild relief.   Review of Systems  HENT:   Positive for congestion, rhinorrhea, sneezing and sore throat.   Respiratory:  Positive for cough. Negative for wheezing.   Cardiovascular:  Negative for chest pain.  Gastrointestinal:  Positive for diarrhea. Negative for abdominal pain, nausea and vomiting.  Musculoskeletal:  Negative for neck pain.  Skin:  Negative for rash.  Neurological:  Positive for headaches.   Will test for flu and COVID    Objective:    BP 107/70   Pulse (!) 57   Temp 98.1 F (36.7 C) (Oral)   Ht 5\' 3"  (1.6 m)   Wt 158 lb 1.6 oz (71.7 kg)   SpO2 99%   BMI 28.01 kg/m    Physical Exam Constitutional:      General: She is not in acute distress.    Appearance: She is well-developed and normal weight. She is ill-appearing. She is not toxic-appearing or diaphoretic.  HENT:     Head: Normocephalic.     Right Ear: External ear normal.     Left Ear: External ear normal.     Nose: Congestion and rhinorrhea present.     Right Turbinates: Enlarged.     Left Turbinates: Enlarged.     Right Sinus: No maxillary sinus tenderness or frontal sinus tenderness.     Left Sinus: No maxillary sinus tenderness or frontal sinus tenderness.     Mouth/Throat:     Mouth: Mucous membranes are moist. No oral lesions.     Pharynx: Uvula midline. Posterior oropharyngeal erythema present. No pharyngeal swelling, oropharyngeal exudate or uvula swelling.     Tonsils: No tonsillar exudate or  tonsillar abscesses. 0 on the right. 0 on the left.  Eyes:     General:        Right eye: No discharge.        Left eye: No discharge.     Extraocular Movements:     Right eye: Normal extraocular motion.     Left eye: Normal extraocular motion.     Conjunctiva/sclera: Conjunctivae normal.     Pupils: Pupils are equal, round, and reactive to light.  Cardiovascular:     Rate and Rhythm: Bradycardia present.     Heart sounds: No murmur heard.    No gallop.     Comments: Mid 50s HR Pulmonary:     Effort: Pulmonary effort is normal. No  respiratory distress.     Breath sounds: Normal breath sounds. No stridor. No wheezing, rhonchi or rales.  Chest:     Chest wall: No tenderness.  Abdominal:     General: Bowel sounds are normal.  Musculoskeletal:     Cervical back: Normal range of motion. No edema, erythema or rigidity.  Lymphadenopathy:     Cervical: Cervical adenopathy present.     Right cervical: Superficial cervical adenopathy present. No deep or posterior cervical adenopathy.    Left cervical: No superficial, deep or posterior cervical adenopathy.  Skin:    General: Skin is warm and dry.     Capillary Refill: Capillary refill takes less than 2 seconds.  Neurological:     General: No focal deficit present.     Mental Status: She is alert and oriented to person, place, and time. Mental status is at baseline.     Cranial Nerves: No cranial nerve deficit.     Sensory: No sensory deficit.     Motor: No weakness.     Coordination: Coordination normal.  Psychiatric:        Mood and Affect: Mood normal.        Behavior: Behavior normal.        Thought Content: Thought content normal.        Judgment: Judgment normal.    Results for orders placed or performed in visit on 11/29/23  POC Covid19/Flu A&B Antigen  Result Value Ref Range   Influenza A Antigen, POC Negative Negative   Influenza B Antigen, POC Negative Negative   Covid Antigen, POC Positive (A) Negative      Assessment & Plan:   Problem List Items Addressed This Visit   None Visit Diagnoses       Symptoms of upper respiratory infection (URI)    -  Primary   Relevant Orders   POC Covid19/Flu A&B Antigen (Completed)     COVID-19       Relevant Medications   nirmatrelvir/ritonavir (PAXLOVID) 20 x 150 MG & 10 x 100MG  TABS      Tested positive for COVID-19 with symptoms.  Symptoms started Monday - pt in window for paxlovid  Pt will Consider Paxlovid due to potential benefits - would like to prescription sent to pharmacy - she will think about it,  instructed to start not later than 11/30/23.  Pt on Stelara which may affect immune response to COVID infection. - Continue acetaminophen, hydration, rest. - Use dextromethorphan-guaifenesin for congestion. - nasal saline spray for sinuses, may use OTC flonase for congestion as well - Drink hot tea with honey, use throat lozenges. - Use humidification, increase water intake. - Wear mask if going out. - Monitor fever, avoid work until [employer] for 24 hours without acetaminophen. -  Contact clinic if symptoms worsen.  Meds ordered this encounter  Medications   nirmatrelvir/ritonavir (PAXLOVID) 20 x 150 MG & 10 x 100MG  TABS    Sig: Take 3 tablets by mouth 2 (two) times daily for 5 days. (Take nirmatrelvir 150 mg two tablets twice daily for 5 days and ritonavir 100 mg one tablet twice daily for 5 days) Patient GFR is 79.    Dispense:  30 tablet    Refill:  0    Return if symptoms worsen or fail to improve.  I, Sallee Provencal, FNP, have reviewed all documentation for this visit. The documentation on 11/29/23 for the exam, diagnosis, procedures, and orders are all accurate and complete.   Sallee Provencal, FNP

## 2023-11-29 NOTE — Telephone Encounter (Signed)
 Copied from CRM [id number]. Topic: Clinical - Red Word Triage >> Nov 29, 2023  9:01 AM Geroge Baseman wrote: Red Word that prompted transfer to Nurse Triage: Sick since Monday/Tuesday. Been taking OTC medications for fever. Terrible headache in the back of her head since Monday night, and a very sore throat.  Chief Complaint: sneezing, headache, sore throat" Symptoms: see above Frequency: constant Pertinent Negatives: Patient denies fever at this time, cp, sob Disposition: [] ED /[] Urgent Care (no appt availability in office) / [x] Appointment(In office/virtual)/ []  [place] [facility]/ [] Home Care/ [] Refused Recommended Disposition /[] [place] [facility]/ []  Follow-up with PCP Additional Notes: apt made for today; care advice given, denies questions, instructed to go to er if becomes worse.   Reason for Disposition  [1] MODERATE headache (e.g., interferes with normal activities) AND [2] present > 24 hours AND [3] unexplained  (Exceptions: analgesics not tried, typical migraine, or headache part of viral illness)  Answer Assessment - Initial Assessment Questions 1. LOCATION: "Where does it hurt?"      Back of head 2. ONSET: "When did the headache start?" (Minutes, hours or days)      Monday 3. PATTERN: "Does the pain come and go, or has it been constant since it started?"     "It depends on the tylenol" 4. SEVERITY: "How bad is the pain?" and "What does it keep you from doing?"  (e.g., Scale 1-10; mild, moderate, or severe)   - MILD (1-3): doesn't interfere with normal activities    - MODERATE (4-7): interferes with normal activities or awakens from sleep    - SEVERE (8-10): excruciating pain, unable to do any normal activities        6/10 5. RECURRENT SYMPTOM: "Have you ever had headaches before?" If Yes, ask: "When was the last time?" and "What happened that time?"      rarely 6. CAUSE: "What do you think is causing the headache?"     "Might be dehydrated" 7. MIGRAINE: "Have you been  diagnosed with migraine headaches?" If Yes, ask: "Is this headache similar?"      denies 8. HEAD INJURY: "Has there been any recent injury to the head?"      1981 9. OTHER SYMPTOMS: "Do you have any other symptoms?" (fever, stiff neck, eye pain, sore throat, cold symptoms)     Sore throat, stiff neck, headache, runny nose, sneezing, cough 10. PREGNANCY: "Is there any chance you are pregnant?" "When was your last menstrual period?"       na  Protocols used: [facility]

## 2023-12-27 DIAGNOSIS — K5190 Ulcerative colitis, unspecified, without complications: Secondary | ICD-10-CM | POA: Diagnosis not present

## 2023-12-30 DIAGNOSIS — K5190 Ulcerative colitis, unspecified, without complications: Secondary | ICD-10-CM | POA: Diagnosis not present

## 2024-01-01 ENCOUNTER — Ambulatory Visit: Admitting: Podiatry

## 2024-01-01 ENCOUNTER — Other Ambulatory Visit: Payer: Self-pay | Admitting: Podiatry

## 2024-01-01 ENCOUNTER — Encounter: Payer: Self-pay | Admitting: Podiatry

## 2024-01-01 ENCOUNTER — Ambulatory Visit (INDEPENDENT_AMBULATORY_CARE_PROVIDER_SITE_OTHER)

## 2024-01-01 DIAGNOSIS — M722 Plantar fascial fibromatosis: Secondary | ICD-10-CM

## 2024-01-01 DIAGNOSIS — M778 Other enthesopathies, not elsewhere classified: Secondary | ICD-10-CM

## 2024-01-01 MED ORDER — TRIAMCINOLONE ACETONIDE 40 MG/ML IJ SUSP
20.0000 mg | Freq: Once | INTRAMUSCULAR | Status: AC
  Administered 2024-01-01: 20 mg

## 2024-01-01 MED ORDER — MELOXICAM 7.5 MG PO TABS: 7.5000 mg | ORAL_TABLET | Freq: Every day | ORAL | 0 refills | Status: DC

## 2024-01-01 NOTE — Progress Notes (Signed)
 Subjective:  Patient ID: Karla [name], female    DOB: November 26, 1955,  MRN: [id number] HPI Chief Complaint  Patient presents with   Foot Pain    Plantar heel left - aching x couple months, feels like a bruise, AM pain, tried tylenol, ice and heat   New Patient (Initial Visit)    68 y.o. female presents with the above complaint.   ROS: Denies fever chills nausea vomit muscle aches pains calf pain back pain chest pain shortness of breath.  Past Medical History:  Diagnosis Date   GERD (gastroesophageal reflux disease)    Insomnia    Restless leg syndrome    Ulcerative colitis (HCC)    Vertigo    Past Surgical History:  Procedure Laterality Date   EXTRACORPOREAL SHOCK WAVE LITHOTRIPSY Left 03/31/2020   Procedure: EXTRACORPOREAL SHOCK WAVE LITHOTRIPSY (ESWL);  Surgeon: Vanna Scotland, MD;  Location: [facility] ORS;  Service: Urology;  Laterality: Left;   NECK SURGERY     SHOULDER SURGERY Left     Current Outpatient Medications:    meloxicam (MOBIC) 7.5 MG tablet, Take 1 tablet (7.5 mg total) by mouth daily., Disp: 30 tablet, Rfl: 0   acetaminophen (TYLENOL) 325 MG tablet, Take by mouth., Disp: , Rfl:    Blood Glucose Monitoring Suppl (ONE TOUCH ULTRA 2) w/Device KIT, Use as directed, Disp: 1 kit, Rfl: 0   citalopram (CELEXA) 40 MG tablet, TAKE 1 TABLET(40 MG) BY MOUTH DAILY, Disp: 90 tablet, Rfl: 1   Collagen-Vitamin C-Biotin (COLLAGEN PO), Take by mouth., Disp: , Rfl:    glucose blood (ONETOUCH ULTRA TEST) test strip, Use as instructed to check blood sugar once daily, Disp: 100 each, Rfl: 12   Melatonin 5 MG SUBL, Place 5 mg under the tongue at bedtime as needed., Disp: , Rfl:    omeprazole (PRILOSEC) 20 MG capsule, TAKE 1 CAPSULE(20 MG) BY MOUTH DAILY, Disp: 90 capsule, Rfl: 3   OneTouch Delica Lancets 33G MISC, Use as directed, Disp: 100 each, Rfl: 1   ustekinumab (STELARA) 90 MG/ML SOSY injection, 90mg  subcutaneous every 8weeks, Disp: , Rfl:   Allergies  Allergen Reactions    Mesalamine     Other reaction(s):Pancreatitis    Prednisone Itching and Palpitations    Hair loss Alopecia    Amoxicillin Other (See Comments)    Other reaction(s): Unknown    Metronidazole Nausea And Vomiting    Abdominal pain, dysgeusia     Review of Systems Objective:  There were no vitals filed for this visit.  General: Well developed, nourished, in no acute distress, alert and oriented x3   Dermatological: Skin is warm, dry and supple bilateral. Nails x 10 are well maintained; remaining integument appears unremarkable at this time. There are no open sores, no preulcerative lesions, no rash or signs of infection present.  Vascular: Dorsalis Pedis artery and Posterior Tibial artery pedal pulses are 2/4 bilateral with immedate capillary fill time. Pedal hair growth present. No varicosities and no lower extremity edema present bilateral.   Neruologic: Grossly intact via light touch bilateral. Vibratory intact via tuning fork bilateral. Protective threshold with Semmes Wienstein monofilament intact to all pedal sites bilateral. Patellar and Achilles deep tendon reflexes 2+ bilateral. No Babinski or clonus noted bilateral.   Musculoskeletal: No gross boney pedal deformities bilateral. No pain, crepitus, or limitation noted with foot and ankle range of motion bilateral. Muscular strength 5/5 in all groups tested bilateral.  She has pain on palpation medial calcaneal tubercle of the left heel.  No pain  on medial-lateral compression of the calcaneus.  Gait: Unassisted, Nonantalgic.    Radiographs:  Radiographs taken today demonstrate osseously mature individual soft tissue increase in density at the plantar fascial calcaneal insertion site.  Assessment & Plan:   Assessment: Plantar fasciitis.  Plan: Started her on 7.5 milligrams of meloxicam.  Injected the heel today 20 mg Kenalog 5 mg and Marcaine.  Placed on the plantar fascial brace and discussed appropriate shoe gear we will  follow-up with her in 1 month discussed stretching exercises and ice therapy.     Kyrianna Barletta T. [place], [place]

## 2024-01-01 NOTE — Patient Instructions (Signed)

## 2024-01-31 ENCOUNTER — Other Ambulatory Visit: Payer: Self-pay | Admitting: Family Medicine

## 2024-01-31 DIAGNOSIS — F3289 Other specified depressive episodes: Secondary | ICD-10-CM

## 2024-02-04 DIAGNOSIS — F419 Anxiety disorder, unspecified: Secondary | ICD-10-CM | POA: Diagnosis not present

## 2024-02-05 ENCOUNTER — Other Ambulatory Visit: Payer: Self-pay | Admitting: Podiatry

## 2024-02-07 DIAGNOSIS — K5190 Ulcerative colitis, unspecified, without complications: Secondary | ICD-10-CM | POA: Diagnosis not present

## 2024-02-12 ENCOUNTER — Ambulatory Visit: Payer: Self-pay

## 2024-02-12 ENCOUNTER — Ambulatory Visit
Admission: RE | Admit: 2024-02-12 | Discharge: 2024-02-12 | Disposition: A | Discharge status: Home / Self Care | Source: Ambulatory Visit | Attending: Emergency Medicine | Admitting: Emergency Medicine

## 2024-02-12 VITALS — BP 117/77 | HR 59 | Temp 97.8°F | Resp 18

## 2024-02-12 DIAGNOSIS — M436 Torticollis: Secondary | ICD-10-CM

## 2024-02-12 MED ORDER — METHOCARBAMOL 500 MG PO TABS: 500.0000 mg | ORAL_TABLET | Freq: Two times a day (BID) | ORAL | 0 refills | Status: DC | PRN

## 2024-02-12 NOTE — ED Provider Notes (Signed)
 Karla [name]    CSN: [id number] Arrival date & time: 02/12/24  1705      History   Chief Complaint Chief Complaint  Patient presents with   Torticollis    HPI Karla [name] is a 68 y.o. female.  Patient presents with muscle pain in her left shoulder, left side of her neck, left upper back.  She woke up with the pain on 02/10/2024.  No falls or injury.  The pain is nonradiating.  No numbness, weakness, paresthesias, fever, chills, chest pain, shortness of breath.  Treatment attempted with heating pad, ice packs, Tylenol , lidocaine patch.  Her medical history includes ulcerative colitis and patient states she is not able to take NSAIDs.  The history is provided by the patient and medical records.    Past Medical History:  Diagnosis Date   GERD (gastroesophageal reflux disease)    Insomnia    Restless leg syndrome    Ulcerative colitis (HCC)    Vertigo     Patient Active Problem List   Diagnosis Date Noted   Viral upper respiratory infection 06/05/2023   Headache 05/13/2023   Lipoma of left lower extremity 01/31/2023   Numbness and tingling of left lower extremity 01/31/2023   Prediabetes 01/22/2023   Malaise and fatigue 01/22/2023   Mixed hyperlipidemia 01/22/2023   Insomnia 01/22/2023   Peripheral neuropathy 01/22/2023   Overweight 03/23/2021   Palpitations 03/23/2021   DOE (dyspnea on exertion) 03/23/2021   Hiatal hernia 05/04/2019   Drug-induced lupus erythematosus 12/22/2018   Depression 05/14/2018   Chronic universal ulcerative colitis (HCC) 10/26/2015   History of Clostridium difficile infection 06/03/2012   IBS (irritable bowel syndrome) 06/03/2012   IFG (impaired fasting glucose) 06/03/2012   Memory loss 06/03/2012   Multinodular goiter 06/03/2012   Obstructive sleep apnea 06/03/2012   Other osteoporosis without current pathological fracture 06/03/2012    Past Surgical History:  Procedure Laterality Date   EXTRACORPOREAL SHOCK WAVE LITHOTRIPSY  Left 03/31/2020   Procedure: EXTRACORPOREAL SHOCK WAVE LITHOTRIPSY (ESWL);  Surgeon: Dustin Gimenez, MD;  Location: [facility] ORS;  Service: Urology;  Laterality: Left;   NECK SURGERY     SHOULDER SURGERY Left     OB History   No obstetric history on file.      Home Medications    Prior to Admission medications   Medication Sig Start Date End Date Taking? Authorizing Provider  methocarbamol (ROBAXIN) 500 MG tablet Take 1 tablet (500 mg total) by mouth 2 (two) times daily as needed for muscle spasms. 02/12/24  Yes Wellington Half, NP  acetaminophen  (TYLENOL ) 325 MG tablet Take by mouth.    [provider]  Blood Glucose Monitoring Suppl (ONE TOUCH ULTRA 2) w/Device KIT Use as directed 05/14/23   Mazie Speed, MD  citalopram  (CELEXA ) 40 MG tablet TAKE 1 TABLET(40 MG) BY MOUTH DAILY 01/31/24   Bacigalupo, Stan Eans, MD  Collagen-Vitamin C-Biotin (COLLAGEN PO) Take by mouth.    [provider]  glucose blood (ONETOUCH ULTRA TEST) test strip Use as instructed to check blood sugar once daily 05/14/23   Bacigalupo, Angela M, MD  Melatonin 5 MG SUBL Place 5 mg under the tongue at bedtime as needed.    [provider]  meloxicam  (MOBIC ) 7.5 MG tablet TAKE 1 TABLET(7.5 MG) BY MOUTH DAILY Patient not taking: Reported on 02/12/2024 02/10/24   Hyatt, Max T, DPM  omeprazole  (PRILOSEC) 20 MG capsule TAKE 1 CAPSULE(20 MG) BY MOUTH DAILY 12/19/21   Trenton Frock, PA-C  OneTouch Delica Lancets 33G MISC Use as directed 05/14/23   Bacigalupo, Angela M, MD  ustekinumab Sherley Distad) 90 MG/ML SOSY injection 90mg  subcutaneous every 8weeks 06/23/20   [provider]    Family History Family History  Problem Relation Age of Onset   Congestive Heart Failure Mother    COPD Mother    Macular degeneration Mother    Vitiligo Mother    Arthritis Father    Diabetes Father    Breast cancer Paternal Grandmother     Social History Social History   Tobacco Use   Smoking status: Never     Passive exposure: Never   Smokeless tobacco: Never  Vaping Use   Vaping status: Never Used  Substance Use Topics   Alcohol use: Never   Drug use: Never     Allergies   Mesalamine, Prednisone, Amoxicillin, and Metronidazole   Review of Systems Review of Systems  Constitutional:  Negative for chills and fever.  Respiratory:  Negative for cough and shortness of breath.   Cardiovascular:  Negative for chest pain and palpitations.  Musculoskeletal:  Positive for back pain, myalgias, neck pain and neck stiffness. Negative for gait problem.  Skin:  Negative for color change, rash and wound.  Neurological:  Negative for weakness and numbness.     Physical Exam Triage Vital Signs ED Triage Vitals  Encounter Vitals Group     BP 02/12/24 1729 117/77     Systolic BP Percentile --      Diastolic BP Percentile --      Pulse Rate 02/12/24 1729 (!) 59     Resp 02/12/24 1729 18     Temp 02/12/24 1729 97.8 F (36.6 C)     Temp src --      SpO2 02/12/24 1729 97 %     Weight --      Height --      Head Circumference --      Peak Flow --      Pain Score 02/12/24 1735 7     Pain Loc --      Pain Education --      Exclude from Growth Chart --    No data found.  Updated Vital Signs BP 117/77   Pulse (!) 59   Temp 97.8 F (36.6 C)   Resp 18   SpO2 97%   Visual Acuity Right Eye Distance:   Left Eye Distance:   Bilateral Distance:    Right Eye Near:   Left Eye Near:    Bilateral Near:     Physical Exam Constitutional:      General: She is not in acute distress. HENT:     Mouth/Throat:     Mouth: Mucous membranes are moist.  Cardiovascular:     Rate and Rhythm: Normal rate and regular rhythm.     Heart sounds: Normal heart sounds.  Pulmonary:     Effort: Pulmonary effort is normal. No respiratory distress.     Breath sounds: Normal breath sounds.  Musculoskeletal:        General: Tenderness present. No swelling or deformity.     Comments: Muscle tenderness and  spasm of left trapezius and left posterior neck muscles.  Spine nontender.  Skin:    General: Skin is warm and dry.     Capillary Refill: Capillary refill takes less than 2 seconds.     Findings: No bruising, erythema, lesion or rash.  Neurological:     General: No focal deficit present.  Mental Status: She is alert.     Sensory: No sensory deficit.     Motor: No weakness.      UC Treatments / Results  Labs (all labs ordered are listed, but only abnormal results are displayed) Labs Reviewed - No data to display  EKG   Radiology No results found.  Procedures Procedures (including critical care time)  Medications Ordered in UC Medications - No data to display  Initial Impression / Assessment and Plan / UC Course  I have reviewed the triage vital signs and the nursing notes.  Pertinent labs & imaging results that were available during my care of the patient were reviewed by me and considered in my medical decision making (see chart for details).    Torticollis.  Patient reports she is not able to take NSAIDs as she has ulcerative colitis.  She has been taking Tylenol .  Treating today with methocarbamol; precautions for drowsiness with this medication discussed.  Discussed symptomatic treatment including gentle stretching, gentle massage, warm compresses.  Education provided on torticollis.  Instructed her to follow-up with her PCP.  ED precautions given.  She agrees to plan of care.  Final Clinical Impressions(s) / UC Diagnoses   Final diagnoses:  Torticollis     Discharge Instructions      Take Tylenol  as needed for discomfort.  Take the muscle relaxer as needed for muscle spasm; Do not drive, operate machinery, or drink alcohol with this medication as it can cause drowsiness.   Follow up with your primary care provider.       ED Prescriptions     Medication Sig Dispense Auth. Provider   methocarbamol (ROBAXIN) 500 MG tablet Take 1 tablet (500 mg total) by  mouth 2 (two) times daily as needed for muscle spasms. 10 tablet Wellington Half, NP      PDMP not reviewed this encounter.   Wellington Half, NP 02/12/24 [phone]

## 2024-02-12 NOTE — Telephone Encounter (Signed)
 Chief Complaint: Neck pain Symptoms: Neck pain Frequency: since Monday Pertinent Negatives: Patient denies chest pain, chest pressure Disposition: [] ED /[x] Urgent Care (no appt availability in office) / [] Appointment(In office/virtual)/ []  [place] [facility]/ [] Home Care/ [] Refused Recommended Disposition /[] [place] [facility]/ []  Follow-up with PCP Additional Notes: Patient stating she has had pain on the left side of her neck since Monday when she woke up. Patient states she is hardly able to turn her head to either side, and is barely able to touch her chin to her chest. Patient denies known cause or injury, and denies being sick or having a fever recently. Patient denies chest pain and pressure. Patient appt made for this evening at [facility]. Patient educated on s/s of cardiac event and when to call 911.   Copied from CRM [id number]. Topic: Clinical - Red Word Triage >> Feb 12, 2024 12:53 PM Felizardo Hotter wrote: Red Word that prompted transfer to Nurse Triage: Pt has neck pain since Monday and pain is not ceasing. Reason for Disposition  [1] SEVERE neck pain (e.g., excruciating, unable to do any normal activities) AND [2] not improved after 2 hours of pain medicine  Answer Assessment - Initial Assessment Questions 1. ONSET: "When did the pain begin?"      Monday 2. LOCATION: "Where does it hurt?"      Left side of neck, between shoulder and neck on back 3. PATTERN "Does the pain come and go, or has it been constant since it started?"      Constant and painful 4. SEVERITY: "How bad is the pain?"  (Scale 1-10; or mild, moderate, severe)   - NO PAIN (0): no pain or only slight stiffness    - MILD (1-3): doesn't interfere with normal activities    - MODERATE (4-7): interferes with normal activities or awakens from sleep    - SEVERE (8-10):  excruciating pain, unable to do any normal activities      7-8 5. RADIATION: "Does the pain go anywhere else, shoot into your arms?"     No 6. CORD  SYMPTOMS: "Any weakness or numbness of the arms or legs?"     No 7. CAUSE: "What do you think is causing the neck pain?"     No, woke up Monday morning and couldn't move head 8. NECK OVERUSE: "Any recent activities that involved turning or twisting the neck?"     No 9. OTHER SYMPTOMS: "Do you have any other symptoms?" (e.g., headache, fever, chest pain, difficulty breathing, neck swelling)     Neck stiffness, nauseated, headache  Protocols used: Neck Pain or Stiffness-A-AH

## 2024-02-12 NOTE — Discharge Instructions (Addendum)
 Take Tylenol  as needed for discomfort.  Take the muscle relaxer as needed for muscle spasm; Do not drive, operate machinery, or drink alcohol with this medication as it can cause drowsiness.   Follow up with your primary care provider.

## 2024-02-12 NOTE — ED Triage Notes (Signed)
 Patient to Urgent Care with complaints of neck pain and stiffness/ nausea/ headaches/ shoulder pain.  Symptoms started on Monday. Woke up with left sided neck pain. Started using a heating pad then ice.   Meds: tylenol / aleve/ lidocaine patches.

## 2024-02-13 NOTE — Telephone Encounter (Signed)
 Noted

## 2024-03-20 DIAGNOSIS — K5190 Ulcerative colitis, unspecified, without complications: Secondary | ICD-10-CM | POA: Diagnosis not present

## 2024-03-21 NOTE — Patient Instructions (Signed)

## 2024-03-26 ENCOUNTER — Ambulatory Visit: Admitting: Nurse Practitioner

## 2024-03-26 ENCOUNTER — Encounter: Payer: Self-pay | Admitting: Nurse Practitioner

## 2024-03-26 VITALS — BP 107/70 | HR 56 | Temp 98.2°F | Resp 15 | Ht 62.99 in | Wt 156.2 lb

## 2024-03-26 DIAGNOSIS — K5100 Ulcerative (chronic) pancolitis without complications: Secondary | ICD-10-CM

## 2024-03-26 DIAGNOSIS — M810 Age-related osteoporosis without current pathological fracture: Secondary | ICD-10-CM

## 2024-03-26 DIAGNOSIS — M25531 Pain in right wrist: Secondary | ICD-10-CM | POA: Diagnosis not present

## 2024-03-26 DIAGNOSIS — K449 Diaphragmatic hernia without obstruction or gangrene: Secondary | ICD-10-CM

## 2024-03-26 DIAGNOSIS — M5416 Radiculopathy, lumbar region: Secondary | ICD-10-CM | POA: Diagnosis not present

## 2024-03-26 DIAGNOSIS — N644 Mastodynia: Secondary | ICD-10-CM

## 2024-03-26 DIAGNOSIS — F320 Major depressive disorder, single episode, mild: Secondary | ICD-10-CM

## 2024-03-26 DIAGNOSIS — Z7689 Persons encountering health services in other specified circumstances: Secondary | ICD-10-CM

## 2024-03-26 DIAGNOSIS — G8929 Other chronic pain: Secondary | ICD-10-CM

## 2024-03-26 DIAGNOSIS — F5101 Primary insomnia: Secondary | ICD-10-CM

## 2024-03-26 DIAGNOSIS — D1724 Benign lipomatous neoplasm of skin and subcutaneous tissue of left leg: Secondary | ICD-10-CM

## 2024-03-26 NOTE — Assessment & Plan Note (Signed)
 Chronic, ongoing.  Took Trazodone  in past.  Currently using OTC treatments.  Continue this, but discuss BEERS criteria and Benadryl  use next visit.  May benefit trial of alternate sleep aide in future.

## 2024-03-26 NOTE — Assessment & Plan Note (Signed)
 Chronic and ongoing for many months, has seen neurology/general surgery/and neurosurgery.  Suspect this is more related to lumbar spine based on imaging results in June 2024 (stenosis and arthritic changes).  Will get her into PT to work on discomfort and mobility.  If no benefit from this then consider trial of Gabapentin and return to neurosurgery.  No red flags on exam.

## 2024-03-26 NOTE — Assessment & Plan Note (Signed)
 Chronic for several years, presenting after a fall.  Imaging at time there was no fracture noted, but was mild OA.  Pain is now causing weakness and affecting quality of life.  Discussed at length with her.  Will repeat imaging since has been some time, see if advancing of OA.  Recommend trial of Voltaren gel, which would not affect her UC.  Continue to wear wrist brace when using.  Will get her into PT to work on strengthening and help with pain. If ongoing may need to send to hand specialist for further recommendations.

## 2024-03-26 NOTE — Assessment & Plan Note (Signed)
 Chronic.  Followed by [facility] GI team, on Stelara.  Continue collaboration and review notes when available.  Sent message to GI provider to alert them of her concerns about Stelara.  Recommend she schedule follow-up.

## 2024-03-26 NOTE — Assessment & Plan Note (Signed)
 Ongoing on imaging, last check in June 2024.  No current preventative medications.  Plan on checking calcium  and Vitamin D  at physical.  Recommend she take supplements.  Would benefit preventative medication, however would avoid Fosamax due to her hiatal hernia and UC.  Could consider Prolia or IV infusions.  Repeat DEXA in June 2026.

## 2024-03-26 NOTE — Assessment & Plan Note (Signed)
 On 05/15/23 to posterior thigh.  Has seen general surgery for this and currently is holding off on surgical removal.

## 2024-03-26 NOTE — Progress Notes (Signed)
 New Patient Office Visit  Subjective    Patient ID: Karla [name], female    DOB: Aug 05, 1956  Age: 68 y.o. MRN: [id number]  CC:  Chief Complaint  Patient presents with   Establish Care    Was a previous patient in the past. Is considering switching back to us .    Foot Pain    Had previous plantar fasciitis diagnosis. Also mentions a knot in the soft tissue on the back of the same leg (left). Burning pain. Had seen neurology but wasn't given any reason for continued pain.   Breast Pain    Left sided towards axillary region. Tender to the touch. Also past few days more pain towards her sternum.    Wrist Pain    Mostly right but notices weakness and difficulty opening jar.    HPI Karla [name] presents for new patient visit to establish care.  Introduced to Publishing rights manager role and practice setting.  All questions answered.  Discussed provider/patient relationship and expectations.  Had an initial with this PCP on 05/04/2019, but has decided to return and is considering switching back.  FOOT PAIN (LEFT) Has had left foot pain for several months. Was diagnosed with plantar fascitis.  For 1-2 years has had a knot to posterior leg, soft tissue.  Saw neurology for this pain, but no answer was given she reports.  Had imaging on 05/15/23 that noted no DVT, there was lipoma noted to posterior left thigh. Sometimes feels like leg will give way. Yesterday had a bad episode that lasted 30 minutes. Osteoporosis on last DEXA 03/04/23. Duration: chronic Involved foot: left Mechanism of injury: unknown Location: down the whole left leg Onset: gradual  Severity: mild  Quality:  burning and numbing Frequency: intermittent Aggravating factors: walking and moving -- does not notice if sitting down as much  Alleviating factors: rubbing and resting  Status: fluctuating Treatments attempted: rest Relief with NSAIDs?:  No NSAIDs Taken Weakness with weight bearing or walking: yes Morning stiffness:  no Swelling: no Redness: no Bruising: no Paresthesias / decreased sensation: no  Fevers:no   BREAST PAIN Left sided breast pain towards axilla, does not notice it on right side. Duration :one year Location: left Onset: gradual Severity: 3/10 Quality: tender and dull Frequency: intermittent Redness: no Swelling: no Trauma: no trauma Breastfeeding: no Associated with menstral cycle: no Nipple discharge: no Breast lump: no Status: fluctuating Treatments attempted: none Previous mammogram: yes last on 05/29/23 -- history of abnormal finding in past to right breast  WRIST PAIN (RIGHT) Notices to right wrist. Opening a jar is difficult.  Can only lift things that less heavy.  Is right hand dominant.  No history of surgeries. Several years ago fell in parking lot, when fell braced self with right hand/wrist.  Will get pain to top of hand. Pain is affecting quality of life.  Enjoys gardening and can not do this as much.   Duration: chronic Involved wrist: right Mechanism of injury:  past fall Location: dorsal Onset: gradual Severity: 6/10  Quality:  sharp, aching, and throbbing Frequency: intermittent Radiation: no Aggravating factors: gripping anything with heavier weight Alleviating factors: rest and Tylenol  somewhat Status: fluctuating Treatments attempted: Tylenol  and wrist brace    Relief with NSAIDs?:  No NSAIDs Taken Weakness: yes Numbness: none Redness: no Bruising: no Swelling: no Fevers: no   CHRONIC ULCERATIVE COLITIS/PANCOLITIS: Follows with [facility] GI team, last visit was 05/03/22.  Takes Stelara injections, every 6 weeks.  The last week prior to injection  she will notice flare starting.  Reports benefit from this regimen, last colonoscopy on chart was 01/03/22. No current scheduled visits with GI. Continues Omeprazole  for hiatal hernia, if does not take she notices heart burn.   DEPRESSION Taking Celexa  daily.  Did take Klonopin years ago, but took herself off due  to concerns for risks. Takes Melatonin as needed for sleep.  Occasionally will take Benadryl  + Tylenol . Mood status: stable Satisfied with current treatment?: yes Symptom severity: mild  Duration of current treatment : chronic Side effects: no Medication compliance: good compliance Psychotherapy/counseling: none Previous psychiatric medications: Klonopin, Trazodone , Citalopram  Anxious mood: yes Anhedonia: no Significant weight loss or gain: no Insomnia: yes hard to fall asleep Fatigue: no Feelings of worthlessness or guilt: no Impaired concentration/indecisiveness: yes Suicidal ideations: no Hopelessness: no Crying spells: no    03/26/2024    8:13 AM 11/29/2023    1:34 PM 09/03/2023    1:54 PM 05/13/2023    9:09 AM 01/22/2023   10:57 AM  Depression screen PHQ 2/9  Decreased Interest 1 2 0 0 0  Down, Depressed, Hopeless 1 2 1 1  0  PHQ - 2 Score 2 4 1 1  0  Altered sleeping 3 3 1 1 3   Tired, decreased energy 2 3 1 1 1   Change in appetite 0 1 0 0 1  Feeling bad or failure about yourself  0 0 0 0 1  Trouble concentrating 1 2 0 0 0  Moving slowly or fidgety/restless 0 0 0 1 0  Suicidal thoughts 0 0 0  0  PHQ-9 Score 8 13 3 4 6   Difficult doing work/chores Not difficult at all Somewhat difficult Not difficult at all Not difficult at all Not difficult at all       03/26/2024    8:13 AM 11/29/2023    1:34 PM 05/13/2023    9:09 AM 04/24/2022   10:51 AM  GAD 7 : Generalized Anxiety Score  Nervous, Anxious, on Edge 0 0 0 1  Control/stop worrying 1 0 0 1  Worry too much - different things 1 0 1 1  Trouble relaxing 1 1 1 3   Restless 0 1 0 3  Easily annoyed or irritable 0 0 0 0  Afraid - awful might happen 0 0 0 0  Total GAD 7 Score 3 2 2 9   Anxiety Difficulty Not difficult at all Somewhat difficult Not difficult at all Not difficult at all      Outpatient Encounter Medications as of 03/26/2024  Medication Sig   acetaminophen  (TYLENOL ) 325 MG tablet Take by mouth.   Blood Glucose  Monitoring Suppl (ONE TOUCH ULTRA 2) w/Device KIT Use as directed   citalopram  (CELEXA ) 40 MG tablet TAKE 1 TABLET(40 MG) BY MOUTH DAILY   Collagen-Vitamin C-Biotin (COLLAGEN PO) Take by mouth.   glucose blood (ONETOUCH ULTRA TEST) test strip Use as instructed to check blood sugar once daily   Melatonin 5 MG SUBL Place 5 mg under the tongue at bedtime as needed.   methocarbamol  (ROBAXIN ) 500 MG tablet Take 1 tablet (500 mg total) by mouth 2 (two) times daily as needed for muscle spasms.   omeprazole  (PRILOSEC) 20 MG capsule TAKE 1 CAPSULE(20 MG) BY MOUTH DAILY   OneTouch Delica Lancets 33G MISC Use as directed   ustekinumab (STELARA) 90 MG/ML SOSY injection 90mg  subcutaneous every 8weeks   [DISCONTINUED] meloxicam  (MOBIC ) 7.5 MG tablet TAKE 1 TABLET(7.5 MG) BY MOUTH DAILY (Patient not taking: Reported on 02/12/2024)  No facility-administered encounter medications on file as of 03/26/2024.    Past Medical History:  Diagnosis Date   Anxiety    Depression    GERD (gastroesophageal reflux disease)    Insomnia    Restless leg syndrome    Ulcerative colitis (HCC)    Vertigo     Past Surgical History:  Procedure Laterality Date   EXTRACORPOREAL SHOCK WAVE LITHOTRIPSY Left 03/31/2020   Procedure: EXTRACORPOREAL SHOCK WAVE LITHOTRIPSY (ESWL);  Surgeon: Penne Knee, MD;  Location: [facility] ORS;  Service: Urology;  Laterality: Left;   NECK SURGERY     SHOULDER SURGERY Left    SPINE SURGERY      Family History  Problem Relation Age of Onset   Congestive Heart Failure Mother    COPD Mother    Macular degeneration Mother    Vitiligo Mother    Arthritis Father    Diabetes Father    Breast cancer Paternal Grandmother     Social History   Socioeconomic History   Marital status: Divorced    Spouse name: Not on file   Number of children: 2   Years of education: Not on file   Highest education level: Not on file  Occupational History   Not on file  Tobacco Use   Smoking status:  Never    Passive exposure: Never   Smokeless tobacco: Never  Vaping Use   Vaping status: Never Used  Substance and Sexual Activity   Alcohol use: Never   Drug use: Never   Sexual activity: Not Currently  Other Topics Concern   Not on file  Social History Narrative   Not on file   Social Drivers of Health   Financial Resource Strain: Low Risk  (09/03/2023)   Overall Financial Resource Strain (CARDIA)    Difficulty of Paying Living Expenses: Not hard at all  Food Insecurity: No Food Insecurity (09/03/2023)   Hunger Vital Sign    Worried About Running Out of Food in the Last Year: Never true    Ran Out of Food in the Last Year: Never true  Transportation Needs: No Transportation Needs (09/03/2023)   PRAPARE - Administrator, Civil Service (Medical): No    Lack of Transportation (Non-Medical): No  Physical Activity: Insufficiently Active (03/26/2024)   Exercise Vital Sign    Days of Exercise per Week: 2 days    Minutes of Exercise per Session: 60 min  Stress: No Stress Concern Present (09/03/2023)   [employer] of Occupational Health - Occupational Stress Questionnaire    Feeling of Stress : Only a little  Social Connections: Moderately Isolated (03/26/2024)   Social Connection and Isolation Panel    Frequency of Communication with Friends and Family: More than three times a week    Frequency of Social Gatherings with Friends and Family: More than three times a week    Attends Religious Services: More than 4 times per year    Active Member of [employer] or Organizations: No    Attends Banker Meetings: Never    Marital Status: Divorced  Catering manager Violence: Not At Risk (09/03/2023)   Humiliation, Afraid, Rape, and Kick questionnaire    Fear of Current or Ex-Partner: No    Emotionally Abused: No    Physically Abused: No    Sexually Abused: No    Review of Systems  Constitutional:  Negative for chills, fever, malaise/fatigue and weight loss.   Respiratory:  Negative for cough, shortness of breath and wheezing.  Cardiovascular:  Negative for chest pain, palpitations and leg swelling.  Gastrointestinal: Negative.   Musculoskeletal:  Positive for joint pain. Negative for falls.  Neurological: Negative.   Psychiatric/Behavioral:  Negative for depression and suicidal ideas. The patient is nervous/anxious and has insomnia.        Objective    BP 107/70 (BP Location: Left Arm, Patient Position: Sitting, Cuff Size: Normal)   Pulse (!) 56   Temp 98.2 F (36.8 C) (Oral)   Resp 15   Ht 5' 2.99 (1.6 m)   Wt 156 lb 3.2 oz (70.9 kg)   SpO2 98%   BMI 27.68 kg/m   Physical Exam Vitals and nursing note reviewed. Exam conducted with a chaperone present.  Constitutional:      General: She is awake. She is not in acute distress.    Appearance: She is well-developed and well-groomed. She is not ill-appearing or toxic-appearing.  HENT:     Head: Normocephalic.     Right Ear: Hearing and external ear normal.     Left Ear: Hearing and external ear normal.  Eyes:     General: Lids are normal.        Right eye: No discharge.        Left eye: No discharge.     Conjunctiva/sclera: Conjunctivae normal.     Pupils: Pupils are equal, round, and reactive to light.  Neck:     Thyroid : No thyromegaly.     Vascular: No carotid bruit.  Cardiovascular:     Rate and Rhythm: Normal rate and regular rhythm.     Heart sounds: Normal heart sounds. No murmur heard.    No gallop.  Pulmonary:     Effort: Pulmonary effort is normal. No accessory muscle usage or respiratory distress.     Breath sounds: Normal breath sounds. No decreased breath sounds, wheezing or rales.  Chest:  Breasts:    Right: Normal.     Left: No swelling, inverted nipple, nipple discharge or skin change.    Abdominal:     General: Bowel sounds are normal. There is no distension.     Palpations: Abdomen is soft.     Tenderness: There is no abdominal tenderness.   Musculoskeletal:     Right wrist: Tenderness (To dorsal aspect) present. No swelling, snuff box tenderness or crepitus. Normal range of motion. Normal pulse.     Left wrist: Normal.     Right hand: Tenderness (to dorsal aspect) present. No swelling or bony tenderness. Normal range of motion. Decreased strength of finger abduction. Normal sensation. Normal pulse.     Left hand: Normal.     Cervical back: Normal range of motion and neck supple.     Lumbar back: Tenderness (mild to lower right and left back) present. No swelling or spasms. Normal range of motion. Negative right straight leg raise test and negative left straight leg raise test.     Right lower leg: No edema.     Left lower leg: No edema.     Comments: Right hand she reports pain with making fist and touching fingers.  Lymphadenopathy:     Cervical: No cervical adenopathy.  Skin:    General: Skin is warm and dry.  Neurological:     Mental Status: She is alert and oriented to person, place, and time.     Deep Tendon Reflexes: Reflexes are normal and symmetric.     Reflex Scores:      Brachioradialis reflexes are 2+ on the right  side and 2+ on the left side.      Patellar reflexes are 2+ on the right side and 2+ on the left side. Psychiatric:        Attention and Perception: Attention normal.        Mood and Affect: Mood normal.        Speech: Speech normal.        Behavior: Behavior normal. Behavior is cooperative.        Thought Content: Thought content normal.     Last CBC Lab Results  Component Value Date   WBC 4.9 05/14/2023   HGB 13.1 05/14/2023   HCT 40.2 05/14/2023   MCV 89 05/14/2023   MCH 28.9 05/14/2023   RDW 12.7 05/14/2023   PLT 278 05/14/2023   Last metabolic panel Lab Results  Component Value Date   GLUCOSE 104 (H) 05/14/2023   NA 139 05/14/2023   K 4.6 05/14/2023   CL 106 05/14/2023   CO2 22 05/14/2023   BUN 25 05/14/2023   CREATININE 0.81 05/14/2023   EGFR 79 05/14/2023   CALCIUM  9.3  05/14/2023   PROT 6.7 05/14/2023   ALBUMIN 4.0 05/14/2023   LABGLOB 2.7 05/14/2023   AGRATIO 1.4 01/22/2023   BILITOT 0.4 05/14/2023   ALKPHOS 79 05/14/2023   AST 14 05/14/2023   ALT 11 05/14/2023   ANIONGAP 11 03/27/2020   Last lipids Lab Results  Component Value Date   CHOL 222 (H) 05/14/2023   HDL 40 05/14/2023   LDLCALC 162 (H) 05/14/2023   TRIG 112 05/14/2023   CHOLHDL 5.6 (H) 05/14/2023   Last hemoglobin A1c Lab Results  Component Value Date   HGBA1C 6.0 (H) 05/14/2023   Last thyroid  functions Lab Results  Component Value Date   TSH 3.070 05/14/2023   Last vitamin D  Lab Results  Component Value Date   VD25OH 26.7 (L) 05/14/2023   Last vitamin B12 and Folate Lab Results  Component Value Date   VITAMINB12 798 01/22/2023      Assessment & Plan:   Problem List Items Addressed This Visit       Respiratory   Hiatal hernia   Chronic and stable with PPI. Continue on Omeprazole  for this.  Followed by [facility] GI team. Risks of PPI use were discussed with patient including bone loss, C. Diff diarrhea, pneumonia, infections, CKD, electrolyte abnormalities.  Verbalizes understanding and chooses to continue the medication. Mag level annually.         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  Followed by [facility] GI team, on Stelara.  Continue collaboration and review notes when available.  Sent message to GI provider to alert them of her concerns about Stelara.  Recommend she schedule follow-up.        Nervous and Auditory   Left lumbar radiculopathy   Chronic and ongoing for many months, has seen neurology/general surgery/and neurosurgery.  Suspect this is more related to lumbar spine based on imaging results in June 2024 (stenosis and arthritic changes).  Will get her into PT to work on discomfort and mobility.  If no benefit from this then consider trial of Gabapentin and return to neurosurgery.  No red flags on exam.      Relevant  Orders   Ambulatory referral to Physical Therapy     Musculoskeletal and Integument   Osteoporosis   Ongoing on imaging, last check in June 2024.  No current preventative medications.  Plan on checking calcium   and Vitamin D  at physical.  Recommend she take supplements.  Would benefit preventative medication, however would avoid Fosamax due to her hiatal hernia and UC.  Could consider Prolia or IV infusions.  Repeat DEXA in June 2026.        Other   Lipoma of left lower extremity   On 05/15/23 to posterior thigh.  Has seen general surgery for this and currently is holding off on surgical removal.      Insomnia   Chronic, ongoing.  Took Trazodone  in past.  Currently using OTC treatments.  Continue this, but discuss BEERS criteria and Benadryl  use next visit.  May benefit trial of alternate sleep aide in future.      Depression   Chronic, ongoing with use of Citalopram .  Continue current medication regimen and adjust as needed.  Denies SI/HI.         Chronic pain of right wrist   Chronic for several years, presenting after a fall.  Imaging at time there was no fracture noted, but was mild OA.  Pain is now causing weakness and affecting quality of life.  Discussed at length with her.  Will repeat imaging since has been some time, see if advancing of OA.  Recommend trial of Voltaren gel, which would not affect her UC.  Continue to wear wrist brace when using.  Will get her into PT to work on strengthening and help with pain. If ongoing may need to send to hand specialist for further recommendations.      Relevant Orders   DG Hand Complete Right   DG Wrist Complete Right   Ambulatory referral to Physical Therapy   Breast pain, left   To left breast, two small, firm suspect lymph nodes to one o'clock aspect of left breast and lower left axilla. This is area of tenderness.  Will plan on diagnostic imaging and ultrasound. She is aware of how to schedule this.      Relevant Orders   MM 3D  DIAGNOSTIC MAMMOGRAM BILATERAL BREAST   US  LIMITED ULTRASOUND INCLUDING AXILLA LEFT BREAST    Other Visit Diagnoses       Encounter to establish care       New patient to clinic, introduced to NP role and clinic setting.       Return in about 8 weeks (around 05/18/2024) for Annual Physical after 05/13/24.   Rhythm Wigfall T Josclyn Rosales, NP

## 2024-03-26 NOTE — Assessment & Plan Note (Signed)
 Chronic, ongoing with use of Citalopram .  Continue current medication regimen and adjust as needed.  Denies SI/HI.

## 2024-03-26 NOTE — Assessment & Plan Note (Signed)
 Chronic and stable with PPI. Continue on Omeprazole  for this.  Followed by [facility] GI team. Risks of PPI use were discussed with patient including bone loss, C. Diff diarrhea, pneumonia, infections, CKD, electrolyte abnormalities.  Verbalizes understanding and chooses to continue the medication. Mag level annually.

## 2024-03-26 NOTE — Assessment & Plan Note (Signed)
 To left breast, two small, firm suspect lymph nodes to one o'clock aspect of left breast and lower left axilla. This is area of tenderness.  Will plan on diagnostic imaging and ultrasound. She is aware of how to schedule this.

## 2024-03-30 ENCOUNTER — Encounter: Payer: Self-pay | Admitting: Physician Assistant

## 2024-04-02 ENCOUNTER — Ambulatory Visit
Admission: RE | Admit: 2024-04-02 | Discharge: 2024-04-02 | Disposition: A | Discharge status: Home / Self Care | Source: Ambulatory Visit | Attending: Nurse Practitioner | Admitting: Nurse Practitioner

## 2024-04-02 ENCOUNTER — Ambulatory Visit: Payer: Self-pay | Admitting: Nurse Practitioner

## 2024-04-02 DIAGNOSIS — N644 Mastodynia: Secondary | ICD-10-CM | POA: Diagnosis not present

## 2024-04-02 DIAGNOSIS — M19031 Primary osteoarthritis, right wrist: Secondary | ICD-10-CM

## 2024-04-02 DIAGNOSIS — R92323 Mammographic fibroglandular density, bilateral breasts: Secondary | ICD-10-CM | POA: Diagnosis not present

## 2024-04-02 NOTE — Progress Notes (Signed)
 Contacted via MyChart  No abnormal findings noted, great news.  May have been a lymph node that was irritated and we can continue to monitor :)

## 2024-04-08 ENCOUNTER — Ambulatory Visit
Admission: RE | Admit: 2024-04-08 | Discharge: 2024-04-08 | Disposition: A | Discharge status: Home / Self Care | Source: Ambulatory Visit | Attending: Nurse Practitioner | Admitting: Nurse Practitioner

## 2024-04-08 ENCOUNTER — Ambulatory Visit

## 2024-04-08 DIAGNOSIS — G8929 Other chronic pain: Secondary | ICD-10-CM | POA: Insufficient documentation

## 2024-04-08 DIAGNOSIS — M25531 Pain in right wrist: Secondary | ICD-10-CM | POA: Diagnosis not present

## 2024-04-08 DIAGNOSIS — M79641 Pain in right hand: Secondary | ICD-10-CM | POA: Diagnosis not present

## 2024-04-08 DIAGNOSIS — M19031 Primary osteoarthritis, right wrist: Secondary | ICD-10-CM | POA: Diagnosis not present

## 2024-04-08 DIAGNOSIS — M1811 Unilateral primary osteoarthritis of first carpometacarpal joint, right hand: Secondary | ICD-10-CM | POA: Diagnosis not present

## 2024-04-08 DIAGNOSIS — M19041 Primary osteoarthritis, right hand: Secondary | ICD-10-CM | POA: Diagnosis not present

## 2024-04-10 ENCOUNTER — Ambulatory Visit: Admit: 2024-04-10 | Discharge: 2024-04-11 | Payer: Medicare (Managed Care)

## 2024-04-10 ENCOUNTER — Encounter
Admit: 2024-04-10 | Discharge: 2024-04-11 | Payer: Medicare (Managed Care) | Attending: Gastroenterology | Primary: Gastroenterology

## 2024-04-10 DIAGNOSIS — K51019 Ulcerative (chronic) pancolitis with unspecified complications: Secondary | ICD-10-CM | POA: Diagnosis not present

## 2024-04-10 NOTE — Telephone Encounter (Signed)
 Good Morning,  Your physical therapy order has been signed.   Darice, NP

## 2024-04-16 DIAGNOSIS — M25531 Pain in right wrist: Secondary | ICD-10-CM | POA: Diagnosis not present

## 2024-04-20 DIAGNOSIS — M25531 Pain in right wrist: Secondary | ICD-10-CM | POA: Diagnosis not present

## 2024-04-23 ENCOUNTER — Encounter
Admit: 2024-04-23 | Discharge: 2024-04-24 | Payer: Medicare (Managed Care) | Attending: Gastroenterology | Primary: Gastroenterology

## 2024-04-23 DIAGNOSIS — K51019 Ulcerative (chronic) pancolitis with unspecified complications: Secondary | ICD-10-CM | POA: Diagnosis not present

## 2024-04-23 MED ORDER — PREDNISONE 10 MG TABLET
ORAL_TABLET | ORAL | 0 refills | 0.00000 days | Status: CP
Start: 2024-04-23 — End: ?

## 2024-04-27 DIAGNOSIS — M25531 Pain in right wrist: Secondary | ICD-10-CM | POA: Diagnosis not present

## 2024-04-29 DIAGNOSIS — M25531 Pain in right wrist: Secondary | ICD-10-CM | POA: Diagnosis not present

## 2024-04-30 ENCOUNTER — Ambulatory Visit: Admit: 2024-04-30 | Discharge: 2024-05-01 | Payer: Medicare (Managed Care)

## 2024-04-30 ENCOUNTER — Encounter
Admit: 2024-04-30 | Discharge: 2024-05-01 | Payer: Medicare (Managed Care) | Attending: Gastroenterology | Primary: Gastroenterology

## 2024-04-30 DIAGNOSIS — K51019 Ulcerative (chronic) pancolitis with unspecified complications: Secondary | ICD-10-CM | POA: Diagnosis not present

## 2024-05-01 DIAGNOSIS — K5190 Ulcerative colitis, unspecified, without complications: Secondary | ICD-10-CM | POA: Diagnosis not present

## 2024-05-07 DIAGNOSIS — K080 Exfoliation of teeth due to systemic causes: Secondary | ICD-10-CM | POA: Diagnosis not present

## 2024-05-15 ENCOUNTER — Encounter: Payer: Self-pay | Admitting: Family Medicine

## 2024-06-07 NOTE — Patient Instructions (Incomplete)
 Be Involved in Caring For Your Health:  Taking Medications When medications are taken as directed, they can greatly improve your health. But if they are not taken as prescribed, they may not work. In some cases, not taking them correctly can be harmful. To help ensure your treatment remains effective and safe, understand your medications and how to take them. Bring your medications to each visit for review by your provider.  Your lab results, notes, and after visit summary will be available on My Chart. We strongly encourage you to use this feature. If lab results are abnormal the clinic will contact you with the appropriate steps. If the clinic does not contact you assume the results are satisfactory. You can always view your results on My Chart. If you have questions regarding your health or results, please contact the clinic during office hours. You can also ask questions on My Chart.  We at [facility] are grateful that you chose [place] to provide your care. We strive to provide evidence-based and compassionate care and are always looking for feedback. If you get a survey from the clinic please complete this so we can hear your opinions.  Heart-Healthy Eating Plan Many factors influence your heart health, including eating and exercise habits. Heart health is also called coronary health. Coronary risk increases with abnormal blood fat (lipid) levels. A heart-healthy eating plan includes limiting unhealthy fats, increasing healthy fats, limiting salt (sodium) intake, and making other diet and lifestyle changes. What is my plan? Your health care provider may recommend that: You limit your fat intake to _________% or less of your total calories each day. You limit your saturated fat intake to _________% or less of your total calories each day. You limit the amount of cholesterol in your diet to less than _________ mg per day. You limit the amount of sodium in your diet to less than _________  mg per day. What are tips for following this plan? Cooking Cook foods using methods other than frying. Baking, boiling, grilling, and broiling are all good options. Other ways to reduce fat include: Removing the skin from poultry. Removing all visible fats from meats. Steaming vegetables in water or broth. Meal planning  At meals, imagine dividing your plate into fourths: Fill one-half of your plate with vegetables and green salads. Fill one-fourth of your plate with whole grains. Fill one-fourth of your plate with lean protein foods. Eat 2-4 cups of vegetables per day. One cup of vegetables equals 1 cup (91 g) broccoli or cauliflower florets, 2 medium carrots, 1 large bell pepper, 1 large sweet potato, 1 large tomato, 1 medium white potato, 2 cups (150 g) raw leafy greens. Eat 1-2 cups of fruit per day. One cup of fruit equals 1 small apple, 1 large banana, 1 cup (237 g) mixed fruit, 1 large orange,  cup (82 g) dried fruit, 1 cup (240 mL) 100% fruit juice. Eat more foods that contain soluble fiber. Examples include apples, broccoli, carrots, beans, peas, and barley. Aim to get 25-30 g of fiber per day. Increase your consumption of legumes, nuts, and seeds to 4-5 servings per week. One serving of dried beans or legumes equals  cup (90 g) cooked, 1 serving of nuts is  oz (12 almonds, 24 pistachios, or 7 walnut halves), and 1 serving of seeds equals  oz (8 g). Fats Choose healthy fats more often. Choose monounsaturated and polyunsaturated fats, such as olive and canola oils, avocado oil, flaxseeds, walnuts, almonds, and seeds. Eat  more omega-3 fats. Choose salmon, mackerel, sardines, tuna, flaxseed oil, and ground flaxseeds. Aim to eat fish at least 2 times each week. Check food labels carefully to identify foods with trans fats or high amounts of saturated fat. Limit saturated fats. These are found in animal products, such as meats, butter, and cream. Plant sources of saturated fats  include palm oil, palm kernel oil, and coconut oil. Avoid foods with partially hydrogenated oils in them. These contain trans fats. Examples are stick margarine, some tub margarines, cookies, crackers, and other baked goods. Avoid fried foods. General information Eat more home-cooked food and less restaurant, buffet, and fast food. Limit or avoid alcohol. Limit foods that are high in added sugar and simple starches such as foods made using white refined flour (white breads, pastries, sweets). Lose weight if you are overweight. Losing just 5-10% of your body weight can help your overall health and prevent diseases such as diabetes and heart disease. Monitor your sodium intake, especially if you have high blood pressure. Talk with your health care provider about your sodium intake. Try to incorporate more vegetarian meals weekly. What foods should I eat? Fruits All fresh, canned (in natural juice), or frozen fruits. Vegetables Fresh or frozen vegetables (raw, steamed, roasted, or grilled). Green salads. Grains Most grains. Choose whole wheat and whole grains most of the time. Rice and pasta, including brown rice and pastas made with whole wheat. Meats and other proteins Lean, well-trimmed beef, veal, pork, and lamb. Chicken and [place] without skin. All fish and shellfish. Wild duck, rabbit, pheasant, and venison. Egg whites or low-cholesterol egg substitutes. Dried beans, peas, lentils, and tofu. Seeds and most nuts. Dairy Low-fat or nonfat cheeses, including ricotta and mozzarella. Skim or 1% milk (liquid, powdered, or evaporated). Buttermilk made with low-fat milk. Nonfat or low-fat yogurt. Fats and oils Non-hydrogenated (trans-free) margarines. Vegetable oils, including soybean, sesame, sunflower, olive, avocado, peanut, safflower, corn, canola, and cottonseed. Salad dressings or mayonnaise made with a vegetable oil. Beverages Water (mineral or sparkling). Coffee and tea. Unsweetened ice  tea. Diet beverages. Sweets and desserts Sherbet, gelatin, and fruit ice. Small amounts of dark chocolate. Limit all sweets and desserts. Seasonings and condiments All seasonings and condiments. The items listed above may not be a complete list of foods and beverages you can eat. Contact a dietitian for more options. What foods should I avoid? Fruits Canned fruit in heavy syrup. Fruit in cream or butter sauce. Fried fruit. Limit coconut. Vegetables Vegetables cooked in cheese, cream, or butter sauce. Fried vegetables. Grains Breads made with saturated or trans fats, oils, or whole milk. Croissants. Sweet rolls. Donuts. High-fat crackers, such as cheese crackers and chips. Meats and other proteins Fatty meats, such as hot dogs, ribs, sausage, bacon, rib-eye roast or steak. High-fat deli meats, such as salami and bologna. Caviar. Domestic duck and goose. Organ meats, such as liver. Dairy Cream, sour cream, cream cheese, and creamed cottage cheese. Whole-milk cheeses. Whole or 2% milk (liquid, evaporated, or condensed). Whole buttermilk. Cream sauce or high-fat cheese sauce. Whole-milk yogurt. Fats and oils Meat fat, or shortening. Cocoa butter, hydrogenated oils, palm oil, coconut oil, palm kernel oil. Solid fats and shortenings, including bacon fat, salt pork, lard, and butter. Nondairy cream substitutes. Salad dressings with cheese or sour cream. Beverages Regular sodas and any drinks with added sugar. Sweets and desserts Frosting. Pudding. Cookies. Cakes. Pies. Milk chocolate or white chocolate. Buttered syrups. Full-fat ice cream or ice cream drinks. The items listed above may  not be a complete list of foods and beverages to avoid. Contact a dietitian for more information. Summary Heart-healthy meal planning includes limiting unhealthy fats, increasing healthy fats, limiting salt (sodium) intake and making other diet and lifestyle changes. Lose weight if you are overweight. Losing just  5-10% of your body weight can help your overall health and prevent diseases such as diabetes and heart disease. Focus on eating a balance of foods, including fruits and vegetables, low-fat or nonfat dairy, lean protein, nuts and legumes, whole grains, and heart-healthy oils and fats. This information is not intended to replace advice given to you by your health care provider. Make sure you discuss any questions you have with your health care provider. Document Revised: 10/16/2021 Document Reviewed: 10/16/2021 Elsevier Patient Education  2024 [employer].

## 2024-06-10 ENCOUNTER — Encounter: Admitting: Nurse Practitioner

## 2024-06-10 DIAGNOSIS — M810 Age-related osteoporosis without current pathological fracture: Secondary | ICD-10-CM

## 2024-06-10 DIAGNOSIS — K5100 Ulcerative (chronic) pancolitis without complications: Secondary | ICD-10-CM

## 2024-06-10 DIAGNOSIS — G4733 Obstructive sleep apnea (adult) (pediatric): Secondary | ICD-10-CM

## 2024-06-10 DIAGNOSIS — F320 Major depressive disorder, single episode, mild: Secondary | ICD-10-CM

## 2024-06-10 DIAGNOSIS — T50905A Adverse effect of unspecified drugs, medicaments and biological substances, initial encounter: Secondary | ICD-10-CM

## 2024-06-10 DIAGNOSIS — R7301 Impaired fasting glucose: Secondary | ICD-10-CM

## 2024-06-10 DIAGNOSIS — E782 Mixed hyperlipidemia: Secondary | ICD-10-CM

## 2024-06-10 DIAGNOSIS — G620 Drug-induced polyneuropathy: Secondary | ICD-10-CM

## 2024-06-10 DIAGNOSIS — F5101 Primary insomnia: Secondary | ICD-10-CM

## 2024-06-11 DIAGNOSIS — K51019 Ulcerative (chronic) pancolitis with unspecified complications: Secondary | ICD-10-CM | POA: Diagnosis not present

## 2024-06-18 ENCOUNTER — Encounter: Payer: Self-pay | Admitting: Nurse Practitioner

## 2024-06-18 ENCOUNTER — Ambulatory Visit (INDEPENDENT_AMBULATORY_CARE_PROVIDER_SITE_OTHER): Admitting: Nurse Practitioner

## 2024-06-18 VITALS — BP 105/69 | HR 58 | Temp 98.1°F | Resp 16 | Ht 62.99 in | Wt 164.8 lb

## 2024-06-18 DIAGNOSIS — K5100 Ulcerative (chronic) pancolitis without complications: Secondary | ICD-10-CM | POA: Diagnosis not present

## 2024-06-18 DIAGNOSIS — M810 Age-related osteoporosis without current pathological fracture: Secondary | ICD-10-CM

## 2024-06-18 DIAGNOSIS — F320 Major depressive disorder, single episode, mild: Secondary | ICD-10-CM | POA: Diagnosis not present

## 2024-06-18 DIAGNOSIS — R7301 Impaired fasting glucose: Secondary | ICD-10-CM | POA: Diagnosis not present

## 2024-06-18 DIAGNOSIS — F5101 Primary insomnia: Secondary | ICD-10-CM

## 2024-06-18 DIAGNOSIS — G4733 Obstructive sleep apnea (adult) (pediatric): Secondary | ICD-10-CM

## 2024-06-18 DIAGNOSIS — R002 Palpitations: Secondary | ICD-10-CM

## 2024-06-18 DIAGNOSIS — G620 Drug-induced polyneuropathy: Secondary | ICD-10-CM

## 2024-06-18 DIAGNOSIS — E782 Mixed hyperlipidemia: Secondary | ICD-10-CM | POA: Diagnosis not present

## 2024-06-18 DIAGNOSIS — Z0000 Encounter for general adult medical examination without abnormal findings: Secondary | ICD-10-CM

## 2024-06-18 MED ORDER — CITALOPRAM HYDROBROMIDE 40 MG PO TABS: ORAL_TABLET | ORAL | 3 refills | Status: AC

## 2024-06-18 NOTE — Assessment & Plan Note (Signed)
 Ongoing on imaging, last check in June 2024.  No current preventative medications.  Plan on checking calcium  and Vitamin D  outpatient.  Recommend she take supplements.  Would benefit preventative medication, however would avoid Fosamax due to her hiatal hernia and UC.  Could consider Prolia or IV infusions.  Repeat DEXA in June 2026.

## 2024-06-18 NOTE — Patient Instructions (Signed)
 American Diabetes Association  Prediabetes: What to Know Prediabetes is when your blood sugar, also called glucose, is at a higher level than normal but not high enough for you to be diagnosed with type 2 diabetes (type 2 diabetes mellitus). Having prediabetes puts you at risk for getting type 2 diabetes. By making some healthy changes, you may be able to prevent or delay getting type 2 diabetes. This is important because type 2 diabetes can lead to serious problems. Some of these include: Heart disease. Stroke. Blindness. Kidney disease. Depression. Poor blood flow in the feet and legs. In very bad cases, this could lead to having a leg removed by surgery (amputation). What are the causes? The exact cause of prediabetes isn't known. It may result from insulin resistance. Insulin resistance happens when cells in the body don't respond properly to insulin that the body makes. This can cause too much sugar to build up in the blood. High blood sugar, also called hyperglycemia, can develop. What increases the risk? Having a family member with type 2 diabetes. Being older than 70 years of age. Having had a temporary form of diabetes during a pregnancy. This is called gestational diabetes. Having had polycystic ovary syndrome (PCOS). Being overweight or obese. Being inactive and not getting much exercise. Having a history of heart disease. This may include problems with cholesterol levels, high levels of blood fats, or high blood pressure. What are the signs or symptoms? You may have no symptoms. If you do have symptoms, they may include: Increased hunger. Increased thirst. Needing to pee more often. Changes in how you see, like blurry vision. Feeling tired. How is this diagnosed? Prediabetes can be diagnosed with blood tests that check your blood sugar. One or more of these tests may be done: A fasting blood glucose (FBG) test. You won't be allowed to eat (you will fast) for at least 8 hours  before a blood sample is taken. An A1C blood test, also called a hemoglobin A1C test. This test shows information about blood sugar levels over the past 2?3 months. An oral glucose tolerance test (OGTT). This test measures your blood sugar at two points in time: After you haven't eaten for a while. This is your baseline level. Two hours after you drink a beverage that has sugar in it. You may be diagnosed with prediabetes if: Your FBG is 100?125 mg/dL (4.3-3.0 mmol/L). Your A1C level is 5.7?6.4% (39-46 mmol/mol). Your OGTT result is 140?199 mg/dL (2.1-88 mmol/L). These blood tests may need to be done again to be sure of the diagnosis. How is this treated? Treatment may include making changes to your diet and lifestyle. These changes can help lower your blood sugar and keep you from getting type 2 diabetes. In some cases, medicine may be given to help lower your risk. Follow these instructions at home: Eating and drinking  Eat and drink as told. Follow a healthy meal plan. This includes eating lean proteins, whole grains, legumes, fresh fruits and vegetables, low-fat dairy products, and healthy fats. Meet with an expert in healthy eating called a dietitian. This person can help create a healthy eating plan that's right for you. Lifestyle Do moderate-intensity exercise. Do this for at least 30 minutes a day on 5 or more days each week, or as told by your health care provider. A mix of activities may be best. Good choices include brisk walking, swimming, biking, and weight lifting. Try to lose weight if your provider says it's OK. Losing 5-7% of  your body weight can help reverse insulin resistance. Do not drink alcohol if: Your provider tells you not to drink. You're pregnant, may be pregnant, or plan to become pregnant. If you drink alcohol: Limit how much you have to: 0-1 drink a day if you're female. 0-2 drinks a day if you're female. Know how much alcohol is in your drink. In the [place]  one drink is one 12 oz bottle of beer (355 mL), one 5 oz glass of wine (148 mL), or one 1 oz glass of hard liquor (44 mL). General instructions Take medicines only as told. You may be given medicines that help lower the risk of type 2 diabetes. Do not smoke, vape, or use nicotine or tobacco. Where to find more information American Diabetes Association: diabetes.org/about-diabetes/prediabetes Academy of Nutrition and Dietetics: eatright.org American Heart Association: Go to ThisJobs.cz. Click the search icon. Type prediabetes in the search box. Contact a health care provider if: You have any of these symptoms: Increased hunger. Peeing more often than usual. Increased thirst. Feeling tired. Changes in how you see, like blurry vision. Feeling like you may throw up. Throwing up. Get help right away if: You have shortness of breath. You feel confused. This information is not intended to replace advice given to you by your health care provider. Make sure you discuss any questions you have with your health care provider. Document Revised: 04/14/2023 Document Reviewed: 04/14/2023 Elsevier Patient Education  2024 [employer].

## 2024-06-18 NOTE — Progress Notes (Addendum)
 BP 105/69 (BP Location: Left Arm, Patient Position: Sitting, Cuff Size: Normal)   Pulse (!) 58   Temp 98.1 F (36.7 C) (Oral)   Resp 16   Ht 5' 2.99 (1.6 m)   Wt 164 lb 12.8 oz (74.8 kg)   SpO2 95%   BMI 29.20 kg/m    Subjective:    Patient ID: Karla [name], female    DOB: Jan 24, 1956, 68 y.o.   MRN: [id number]  HPI: Karla [name] is a 68 y.o. female presenting on 06/18/2024 for comprehensive medical examination. Current medical complaints include:none  She currently lives with: husband Menopausal Symptoms: no  CHRONIC ULCERATIVE COLITIS/PANCOLITIS: Last saw [facility] on 04/23/24 as Stelara was not working, they placed her on Prednisone short burst.  They changed her to [employer], had first infusion recently.  At this time overall feels okay, a little light headed. Last colonoscopy on chart was 01/03/22. Continues Omeprazole  for hiatal hernia, if does not take she notices heart burn.  Impaired Fasting Glucose Craving sugar often, even in middle of the night. HbA1C:  Lab Results  Component Value Date   HGBA1C 6.0 (H) 05/14/2023  Duration of elevated blood sugar: chronic Polydipsia: always thirsty and polyphagia Polyuria: no Weight change: no Visual disturbance: no Glucose Monitoring: no    Accucheck frequency: Not Checking    Fasting glucose:     Post prandial:  Diabetic Education: Not Completed Family history of diabetes: yes -- father has Type 2 and brother   DEPRESSION Taking Celexa  daily. Took Klonopin years ago, but took herself off due to concerns for risks. Takes Melatonin as needed for sleep, which offers benefit but only takes as needed. Mood status: stable Satisfied with current treatment?: yes Symptom severity: mild  Duration of current treatment : chronic Side effects: no Medication compliance: good compliance Psychotherapy/counseling: none Previous psychiatric medications: Klonopin, Trazodone , Citalopram  Anxious mood: yes Anhedonia: no Significant weight  loss or gain: no Insomnia: yes hard to fall asleep Fatigue: no Feelings of worthlessness or guilt: no Impaired concentration/indecisiveness: yes Suicidal ideations: no Hopelessness: no Crying spells: no    06/18/2024   11:18 AM 03/26/2024    8:13 AM 11/29/2023    1:34 PM 09/03/2023    1:54 PM 05/13/2023    9:09 AM  Depression screen PHQ 2/9  Decreased Interest 0 1 2 0 0  Down, Depressed, Hopeless 0 1 2 1 1   PHQ - 2 Score 0 2 4 1 1   Altered sleeping 0 3 3 1 1   Tired, decreased energy 0 2 3 1 1   Change in appetite 0 0 1 0 0  Feeling bad or failure about yourself  0 0 0 0 0  Trouble concentrating 0 1 2 0 0  Moving slowly or fidgety/restless 0 0 0 0 1  Suicidal thoughts 0 0 0 0   PHQ-9 Score 0 8 13 3 4   Difficult doing work/chores  Not difficult at all Somewhat difficult Not difficult at all Not difficult at all      03/26/2024    8:13 AM 11/29/2023    1:34 PM 05/13/2023    9:09 AM 04/24/2022   10:51 AM  GAD 7 : Generalized Anxiety Score  Nervous, Anxious, on Edge 0 0 0 1  Control/stop worrying 1 0 0 1  Worry too much - different things 1 0 1 1  Trouble relaxing 1 1 1 3   Restless 0 1 0 3  Easily annoyed or irritable 0 0 0 0  Afraid -  awful might happen 0 0 0 0  Total GAD 7 Score 3 2 2 9   Anxiety Difficulty Not difficult at all Somewhat difficult Not difficult at all Not difficult at all        01/22/2023   10:56 AM 05/13/2023    9:09 AM 09/03/2023    1:57 PM 03/26/2024    8:12 AM 06/18/2024   11:18 AM  Fall Risk  Falls in the past year? 1 0 0 0 0  Was there an injury with Fall? 0 0 0 0 0  Fall Risk Category Calculator 1 0 0 0 0  Patient at Risk for Falls Due to History of fall(s) No Fall Risks No Fall Risks No Fall Risks No Fall Risks  Fall risk Follow up Falls evaluation completed Falls evaluation completed Falls prevention discussed;Falls evaluation completed Falls evaluation completed Falls evaluation completed    Functional Status Survey: Is the patient deaf or have  difficulty hearing?: No Does the patient have difficulty seeing, even when wearing glasses/contacts?: No Does the patient have difficulty concentrating, remembering, or making decisions?: No Does the patient have difficulty walking or climbing stairs?: No Does the patient have difficulty dressing or bathing?: No Does the patient have difficulty doing errands alone such as visiting a doctor's office or shopping?: No   Past Medical History:  Past Medical History:  Diagnosis Date   Anxiety    Depression    GERD (gastroesophageal reflux disease)    Insomnia    Restless leg syndrome    Ulcerative colitis (HCC)    Vertigo     Surgical History:  Past Surgical History:  Procedure Laterality Date   EXTRACORPOREAL SHOCK WAVE LITHOTRIPSY Left 03/31/2020   Procedure: EXTRACORPOREAL SHOCK WAVE LITHOTRIPSY (ESWL);  Surgeon: Penne Knee, MD;  Location: [facility] ORS;  Service: Urology;  Laterality: Left;   NECK SURGERY     SHOULDER SURGERY Left    SPINE SURGERY      Medications:  Current Outpatient Medications on File Prior to Visit  Medication Sig   acetaminophen  (TYLENOL ) 325 MG tablet Take by mouth.   Blood Glucose Monitoring Suppl (ONE TOUCH ULTRA 2) w/Device KIT Use as directed   Collagen-Vitamin C-Biotin (COLLAGEN PO) Take by mouth.   glucose blood (ONETOUCH ULTRA TEST) test strip Use as instructed to check blood sugar once daily   Melatonin 5 MG SUBL Place 5 mg under the tongue at bedtime as needed.   omeprazole  (PRILOSEC) 20 MG capsule TAKE 1 CAPSULE(20 MG) BY MOUTH DAILY   OneTouch Delica Lancets 33G MISC Use as directed   ustekinumab (STELARA) 90 MG/ML SOSY injection 90mg  subcutaneous every 8weeks   No current facility-administered medications on file prior to visit.    Allergies:  Allergies  Allergen Reactions   Mesalamine     Other reaction(s):Pancreatitis    Prednisone Itching and Palpitations    Hair loss Alopecia    Amoxicillin Other (See Comments)    Other  reaction(s): Unknown    Metronidazole Nausea And Vomiting    Abdominal pain, dysgeusia      Social History:  Social History   Socioeconomic History   Marital status: Divorced    Spouse name: Not on file   Number of children: 2   Years of education: Not on file   Highest education level: Not on file  Occupational History   Not on file  Tobacco Use   Smoking status: Never    Passive exposure: Never   Smokeless tobacco: Never  Vaping Use  Vaping status: Never Used  Substance and Sexual Activity   Alcohol use: Never   Drug use: Never   Sexual activity: Not Currently  Other Topics Concern   Not on file  Social History Narrative   Not on file   Social Drivers of Health   Financial Resource Strain: Low Risk  (09/03/2023)   Overall Financial Resource Strain (CARDIA)    Difficulty of Paying Living Expenses: Not hard at all  Food Insecurity: No Food Insecurity (09/03/2023)   Hunger Vital Sign    Worried About Running Out of Food in the Last Year: Never true    Ran Out of Food in the Last Year: Never true  Transportation Needs: No Transportation Needs (09/03/2023)   PRAPARE - Administrator, Civil Service (Medical): No    Lack of Transportation (Non-Medical): No  Physical Activity: Insufficiently Active (03/26/2024)   Exercise Vital Sign    Days of Exercise per Week: 2 days    Minutes of Exercise per Session: 60 min  Stress: No Stress Concern Present (09/03/2023)   [employer] of Occupational Health - Occupational Stress Questionnaire    Feeling of Stress : Only a little  Social Connections: Moderately Isolated (03/26/2024)   Social Connection and Isolation Panel    Frequency of Communication with Friends and Family: More than three times a week    Frequency of Social Gatherings with Friends and Family: More than three times a week    Attends Religious Services: More than 4 times per year    Active Member of [employer] or Organizations: No    Attends Tax inspector Meetings: Never    Marital Status: Divorced  Catering manager Violence: Not At Risk (09/03/2023)   Humiliation, Afraid, Rape, and Kick questionnaire    Fear of Current or Ex-Partner: No    Emotionally Abused: No    Physically Abused: No    Sexually Abused: No   Social History   Tobacco Use  Smoking Status Never   Passive exposure: Never  Smokeless Tobacco Never   Social History   Substance and Sexual Activity  Alcohol Use Never    Family History:  Family History  Problem Relation Age of Onset   Congestive Heart Failure Mother    COPD Mother    Macular degeneration Mother    Vitiligo Mother    Arthritis Father    Diabetes Father    Breast cancer Paternal Grandmother    Past medical history, surgical history, medications, allergies, family history and social history reviewed with patient today and changes made to appropriate areas of the chart.   ROS All other ROS negative except what is listed above and in the HPI.      Objective:    BP 105/69 (BP Location: Left Arm, Patient Position: Sitting, Cuff Size: Normal)   Pulse (!) 58   Temp 98.1 F (36.7 C) (Oral)   Resp 16   Ht 5' 2.99 (1.6 m)   Wt 164 lb 12.8 oz (74.8 kg)   SpO2 95%   BMI 29.20 kg/m   Wt Readings from Last 3 Encounters:  06/18/24 164 lb 12.8 oz (74.8 kg)  03/26/24 156 lb 3.2 oz (70.9 kg)  11/29/23 158 lb 1.6 oz (71.7 kg)    Physical Exam Vitals and nursing note reviewed.  Constitutional:      General: She is awake. She is not in acute distress.    Appearance: She is well-developed and well-groomed. She is not ill-appearing or toxic-appearing.  HENT:     Head: Normocephalic and atraumatic.     Right Ear: Hearing, tympanic membrane, ear canal and external ear normal. No drainage.     Left Ear: Hearing, tympanic membrane, ear canal and external ear normal. No drainage.     Nose: Nose normal.     Right Sinus: No maxillary sinus tenderness or frontal sinus tenderness.     Left  Sinus: No maxillary sinus tenderness or frontal sinus tenderness.     Mouth/Throat:     Mouth: Mucous membranes are moist.     Pharynx: Oropharynx is clear. Uvula midline. No pharyngeal swelling, oropharyngeal exudate or posterior oropharyngeal erythema.  Eyes:     General: Lids are normal.        Right eye: No discharge.        Left eye: No discharge.     Extraocular Movements: Extraocular movements intact.     Conjunctiva/sclera: Conjunctivae normal.     Pupils: Pupils are equal, round, and reactive to light.     Visual Fields: Right eye visual fields normal and left eye visual fields normal.  Neck:     Thyroid : No thyromegaly.     Vascular: No carotid bruit.     Trachea: Trachea normal.  Cardiovascular:     Rate and Rhythm: Regular rhythm. Bradycardia present.     Heart sounds: Normal heart sounds. No murmur heard.    No gallop.  Pulmonary:     Effort: Pulmonary effort is normal. No accessory muscle usage or respiratory distress.     Breath sounds: Normal breath sounds.  Chest:     Comments: Deferred due to recent mammogram Abdominal:     General: Bowel sounds are normal.     Palpations: Abdomen is soft. There is no hepatomegaly or splenomegaly.     Tenderness: There is no abdominal tenderness.  Musculoskeletal:        General: Normal range of motion.     Cervical back: Normal range of motion and neck supple.     Right lower leg: No edema.     Left lower leg: No edema.  Lymphadenopathy:     Head:     Right side of head: No submental, submandibular, tonsillar, preauricular or posterior auricular adenopathy.     Left side of head: No submental, submandibular, tonsillar, preauricular or posterior auricular adenopathy.     Cervical: No cervical adenopathy.  Skin:    General: Skin is warm and dry.     Capillary Refill: Capillary refill takes less than 2 seconds.     Findings: No rash.  Neurological:     Mental Status: She is alert and oriented to person, place, and time.      Gait: Gait is intact.     Deep Tendon Reflexes: Reflexes are normal and symmetric.     Reflex Scores:      Brachioradialis reflexes are 2+ on the right side and 2+ on the left side.      Patellar reflexes are 2+ on the right side and 2+ on the left side. Psychiatric:        Attention and Perception: Attention normal.        Mood and Affect: Mood normal.        Speech: Speech normal.        Behavior: Behavior normal. Behavior is cooperative.        Thought Content: Thought content normal.        Judgment: Judgment normal.    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  Followed by [facility] team, on Skyrizi.  Continue collaboration and review notes when available. Labs outpatient.      Relevant Orders   CBC with Differential/Platelet   Magnesium   TSH   Vitamin B12     Endocrine   IFG (impaired fasting glucose)   A1c 6% in August 2024, is having some symptoms of diabetes and has family history. Will recheck today and if >6.5% discuss further.  We did discuss diet therapy and some of the medications used today. She prefers a diet focus if elevated.  Recommend she look at the American Diabetes Association website for recipes and ideas. Labs outpatient.      Relevant Orders   Bayer DCA Hb A1c Waived   Microalbumin, Urine Waived     Musculoskeletal and Integument   Osteoporosis   Ongoing on imaging, last check in June 2024.  No current preventative medications.  Plan on checking calcium  and Vitamin D  outpatient.  Recommend she take supplements.  Would benefit preventative medication, however  would avoid Fosamax due to her hiatal hernia and UC.  Could consider Prolia or IV infusions.  Repeat DEXA in June 2026.      Relevant Orders   VITAMIN D  25 Hydroxy (Vit-D Deficiency, Fractures)     Other   Mixed hyperlipidemia   Chronic, no current medications.  Check labs fasting outpatient and determine needs after results return.      Relevant Orders   Comprehensive metabolic panel with GFR   Lipid Panel w/o Chol/HDL Ratio   Insomnia   Chronic, ongoing.  Took Trazodone  in past.  Currently using OTC treatments and recommend she try Magnesium Glycinate 400 MG at night as well, which may benefit both restless leg and sleep.  Educated her on this.      Depression   Chronic, ongoing with use of Citalopram .  Continue current medication regimen and adjust as needed.  Denies SI/HI.         Relevant Medications   citalopram  (CELEXA ) 40 MG tablet   Other Visit Diagnoses       Encounter for annual physical exam       Annual physical today with labs and health maintenance reviewed, discussed with patient.        Follow up plan: Return in about 6 months (around 12/16/2024) for UC, DEPRESSION, A1c, OSTEOPOROSIS.   LABORATORY TESTING:  - Pap smear: not applicable  IMMUNIZATIONS:   - Tdap: Tetanus vaccination status reviewed: refused. - Influenza: Refused - Pneumovax: Refused - Prevnar: Refused - COVID: Refused - HPV: Refused - Shingrix vaccine: Refused  SCREENING: -Mammogram: Up to date next due July 2026 - Colonoscopy: Up to date  - Bone Density: Up to date  -Hearing Test: Not applicable  -Spirometry: Not applicable   PATIENT COUNSELING:   Advised to take 1 mg of folate supplement per day if capable of pregnancy.   Sexuality: Discussed sexually transmitted diseases, partner selection, use of condoms, avoidance of unintended pregnancy  and contraceptive alternatives.  Advised to avoid cigarette smoking.  I discussed with the patient that most people either abstain  from alcohol or drink within safe limits (<=14/week and <=4 drinks/occasion for males, <=7/weeks and <= 3 drinks/occasion for females) and that the risk for alcohol disorders and other health effects rises proportionally with the number of drinks per week and how often a drinker exceeds daily limits.  Discussed cessation/primary prevention of drug use and availability of treatment for abuse.   Diet: Encouraged to adjust caloric intake to maintain  or achieve ideal body weight, to reduce intake of dietary saturated fat and total fat, to limit sodium intake by avoiding high sodium foods and not adding table salt, and to maintain adequate dietary potassium and calcium  preferably from fresh fruits, vegetables, and low-fat dairy products.    stressed the importance of regular exercise  Injury prevention: Discussed safety belts, safety helmets, smoke detector, smoking near bedding or upholstery.   Dental health: Discussed importance of regular tooth brushing, flossing, and dental visits.    NEXT PREVENTATIVE PHYSICAL DUE IN 1 YEAR. Return in about 6 months (around 12/16/2024) for UC, DEPRESSION, A1c, OSTEOPOROSIS.

## 2024-06-18 NOTE — Assessment & Plan Note (Signed)
 Chronic, ongoing.  Took Trazodone  in past.  Currently using OTC treatments and recommend she try Magnesium Glycinate 400 MG at night as well, which may benefit both restless leg and sleep.  Educated her on this.

## 2024-06-18 NOTE — Assessment & Plan Note (Signed)
 Chronic, ongoing with use of Citalopram .  Continue current medication regimen and adjust as needed.  Denies SI/HI.

## 2024-06-18 NOTE — Assessment & Plan Note (Signed)
 Chronic, no current medications.  Check labs fasting outpatient and determine needs after results return.

## 2024-06-18 NOTE — Assessment & Plan Note (Addendum)
 A1c 6% in August 2024, is having some symptoms of diabetes and has family history. Will recheck today and if >6.5% discuss further.  We did discuss diet therapy and some of the medications used today. She prefers a diet focus if elevated.  Recommend she look at the American Diabetes Association website for recipes and ideas. Labs outpatient.

## 2024-06-18 NOTE — Assessment & Plan Note (Signed)
 Chronic.  Followed by [facility] team, on Skyrizi.  Continue collaboration and review notes when available. Labs outpatient.

## 2024-06-19 ENCOUNTER — Other Ambulatory Visit

## 2024-06-19 DIAGNOSIS — M810 Age-related osteoporosis without current pathological fracture: Secondary | ICD-10-CM

## 2024-06-19 DIAGNOSIS — R7301 Impaired fasting glucose: Secondary | ICD-10-CM

## 2024-06-19 DIAGNOSIS — K5100 Ulcerative (chronic) pancolitis without complications: Secondary | ICD-10-CM

## 2024-06-19 DIAGNOSIS — E782 Mixed hyperlipidemia: Secondary | ICD-10-CM

## 2024-06-19 LAB — MICROALBUMIN, URINE WAIVED
Creatinine, Urine Waived: 100 mg/dL (ref 10–300)
Microalb, Ur Waived: 80 mg/L — ABNORMAL HIGH (ref 0–19)

## 2024-06-19 LAB — BAYER DCA HB A1C WAIVED: HB A1C (BAYER DCA - WAIVED): 6 % — ABNORMAL HIGH (ref 4.8–5.6)

## 2024-06-20 LAB — COMPREHENSIVE METABOLIC PANEL WITH GFR
ALT: 11 IU/L (ref 0–32)
AST: 19 IU/L (ref 0–40)
Albumin: 4 g/dL (ref 3.9–4.9)
Alkaline Phosphatase: 89 IU/L (ref 49–135)
BUN/Creatinine Ratio: 25 (ref 12–28)
BUN: 20 mg/dL (ref 8–27)
Bilirubin Total: 0.5 mg/dL (ref 0.0–1.2)
CO2: 22 mmol/L (ref 20–29)
Calcium: 9.3 mg/dL (ref 8.7–10.3)
Chloride: 104 mmol/L (ref 96–106)
Creatinine, Ser: 0.81 mg/dL (ref 0.57–1.00)
Globulin, Total: 2.8 g/dL (ref 1.5–4.5)
Glucose: 98 mg/dL (ref 70–99)
Potassium: 4.4 mmol/L (ref 3.5–5.2)
Sodium: 139 mmol/L (ref 134–144)
Total Protein: 6.8 g/dL (ref 6.0–8.5)
eGFR: 78 mL/min/1.73 (ref >59)

## 2024-06-20 LAB — VITAMIN D 25 HYDROXY (VIT D DEFICIENCY, FRACTURES): Vit D, 25-Hydroxy: 28.1 ng/mL — ABNORMAL LOW (ref 30.0–100.0)

## 2024-06-20 LAB — LIPID PANEL W/O CHOL/HDL RATIO
Cholesterol, Total: 249 mg/dL — ABNORMAL HIGH (ref 100–199)
HDL: 44 mg/dL (ref >39)
LDL Chol Calc (NIH): 177 mg/dL — ABNORMAL HIGH (ref 0–99)
Triglycerides: 151 mg/dL — ABNORMAL HIGH (ref 0–149)
VLDL Cholesterol Cal: 28 mg/dL (ref 5–40)

## 2024-06-20 LAB — CBC WITH DIFFERENTIAL/PLATELET
Basophils Absolute: 0.1 x10E3/uL (ref 0.0–0.2)
Basos: 1 %
EOS (ABSOLUTE): 0.2 x10E3/uL (ref 0.0–0.4)
Eos: 4 %
Hematocrit: 42.6 % (ref 34.0–46.6)
Hemoglobin: 13.9 g/dL (ref 11.1–15.9)
Immature Grans (Abs): 0 x10E3/uL (ref 0.0–0.1)
Immature Granulocytes: 0 %
Lymphocytes Absolute: 2.2 x10E3/uL (ref 0.7–3.1)
Lymphs: 36 %
MCH: 29.6 pg (ref 26.6–33.0)
MCHC: 32.6 g/dL (ref 31.5–35.7)
MCV: 91 fL (ref 79–97)
Monocytes Absolute: 0.5 x10E3/uL (ref 0.1–0.9)
Monocytes: 9 %
Neutrophils Absolute: 3.1 x10E3/uL (ref 1.4–7.0)
Neutrophils: 50 %
Platelets: 302 x10E3/uL (ref 150–450)
RBC: 4.7 x10E6/uL (ref 3.77–5.28)
RDW: 12.6 % (ref 11.7–15.4)
WBC: 6.2 x10E3/uL (ref 3.4–10.8)

## 2024-06-20 LAB — TSH: TSH: 2.93 u[IU]/mL (ref 0.450–4.500)

## 2024-06-20 LAB — VITAMIN B12: Vitamin B-12: 902 pg/mL (ref 232–1245)

## 2024-06-20 LAB — MAGNESIUM: Magnesium: 2.2 mg/dL (ref 1.6–2.3)

## 2024-06-21 ENCOUNTER — Ambulatory Visit: Payer: Self-pay | Admitting: Nurse Practitioner

## 2024-06-21 NOTE — Progress Notes (Signed)
 Contacted via MyChart The 10-year ASCVD risk score (Arnett DK, et al., 2019) is: 7.3%   Values used to calculate the score:     Age: 68 years     Clincally relevant sex: Female     Is Non-Hispanic African American: No     Diabetic: No     Tobacco smoker: No     Systolic Blood Pressure: 105 mmHg     Is BP treated: No     HDL Cholesterol: 44 mg/dL     Total Cholesterol: 249 mg/dL  Good morning Heron, your labs have returned: - Vitamin D  level a little low, ensure you are taking Vitamin D3 2000 units daily for bone health. - Lipid panel continues to show elevations, LDL (bad cholesterol) is less than 190 and if calculated your risk for stroke or heart event is 7.3% which is just slightly above where medication is recommend (7%).  I know you prefer less medication, so focus heavily on diet changes.  The American Heart Association has a lot of information on this.  Add an Omega 3 supplement to daily regimen.  We will monitor. - A1c still in prediabetic range at 6%.  Continue diet focus as we discussed. - Remainder of labs are stable.  Any questions? Keep being amazing!!  Thank you for allowing me to participate in your care.  I appreciate you. Kindest regards, Nataniel Gasper

## 2024-07-09 DIAGNOSIS — K51019 Ulcerative (chronic) pancolitis with unspecified complications: Principal | ICD-10-CM

## 2024-07-09 MED ORDER — SKYRIZI 360 MG/2.4 ML (150 MG/ML) SUBCUTANEOUS WEARABLE INJECTOR
SUBCUTANEOUS | 3 refills | 0.00000 days | Status: CP
Start: 2024-07-09 — End: ?
  Filled 2024-09-03: qty 2.4, 56d supply, fill #0

## 2024-07-09 NOTE — Unmapped (Signed)
 MEDICATION ORDER ENTRY DOCUMENTATION    The following medication(s) have been sent to pharmacy for insurance authorization:    Medication: risankizumab  Dose & Frequency: 360mg  subcutaneous every 8 weeks   Pharmacy: [facility]     Lab Results   Component Value Date    QFTTBGOLD Negative 04/30/2024    HEPBCAB Nonreactive 05/16/2018    HBSAG Nonreactive 05/16/2018           Requesting Physician: Dr. Dorn Lauth    Notes:     Skyrizi induction doses on 9/18, 10/16 and 11/13

## 2024-07-09 NOTE — Unmapped (Signed)
 [facility] Specialty Medication Onboarding    Specialty Medication: Careers adviser  Prior Authorization: Not Required   Financial Assistance: No - copay  <$25  Final Copay/Day Supply: $0 / 56 days    Insurance Restrictions: None     Notes to Pharmacist:   Credit Card on File: not applicable  Start Date on Rx:    Delivery Method (based on home address currently on file): Courier      The triage team has completed the benefits investigation and has determined that the patient is able to fill this medication at [facility]. Please contact the patient to complete the onboarding or follow up with the prescribing physician as needed.

## 2024-07-10 DIAGNOSIS — K51019 Ulcerative (chronic) pancolitis with unspecified complications: Secondary | ICD-10-CM | POA: Diagnosis not present

## 2024-07-10 NOTE — Unmapped (Signed)
 Skyrizi induction doses on 9/18, 10/16 and 11/13      Infusions scheduled:    Skyrizi Q8W approved for $0 copay.   Skyrizi requires three IV inductions doses completed at weeks 0, 4, and 8 with the maintenance subcutaneous dose starting at week 12.   Heron Gals completed 1st IV induction dose on 9/18. The other induction IV doses are scheduled for 10/16 and 11/13.     Patient outreach is scheduled the week of November 24th which is 2 weeks before first subcutaneous dose is due on 09/03/2024.  Sent patient Mychart message with link to video and communicated outreach.       Diamond Mcconnell, PharmD   [facility]  [address], [place], [place] [address]  Phone: [phone] - Fax. [phone]

## 2024-08-02 MED ORDER — OMEPRAZOLE 20 MG CAPSULE,DELAYED RELEASE
ORAL_CAPSULE | 3 refills | 0.00000 days
Start: 2024-08-02 — End: ?

## 2024-08-03 MED ORDER — OMEPRAZOLE 20 MG CAPSULE,DELAYED RELEASE
ORAL_CAPSULE | ORAL | 3 refills | 0.00000 days | Status: CP
Start: 2024-08-03 — End: ?

## 2024-08-03 NOTE — Telephone Encounter (Signed)
 Patient is requesting the following refill  Requested Prescriptions     Pending Prescriptions Disp Refills    omeprazole  (PRILOSEC) 20 MG capsule [Pharmacy Med Name: OMEPRAZOLE  20MG  CAPSULES] 90 capsule 3     Sig: TAKE 1 CAPSULE BY MOUTH DAILY 30 MINUTES BEFORE BREAKFAST       Recent Visits  Date Type Provider Dept   04/23/24 Telemedicine Conny, Dorn Agent, MD [facility]   Showing recent visits within past 365 days and meeting all other requirements  Future Appointments  No visits were found meeting these conditions.  Showing future appointments within next 365 days and meeting all other requirements           Encounter for refill request:    Colonoscopy:     Lab Results   Component Value Date    WBC 4.3 (L) 12/18/2018    RBC 4.53 12/18/2018    HGB 11.6 (L) 12/18/2018    HCT 36.7 12/18/2018    PLT 296 12/18/2018    ALT 11 04/30/2024    AST 12 04/30/2024    ALKPHOS 77 04/30/2024    CRP <5.0 12/18/2018    CREATININE 0.69 05/20/2018       refills authorized x

## 2024-08-07 DIAGNOSIS — Z79899 Other long term (current) drug therapy: Secondary | ICD-10-CM | POA: Diagnosis not present

## 2024-08-07 DIAGNOSIS — K51019 Ulcerative (chronic) pancolitis with unspecified complications: Secondary | ICD-10-CM | POA: Diagnosis not present

## 2024-08-10 NOTE — Telephone Encounter (Signed)
 Patient called IBD clinic re: what she thinks are possible sife effects of skyrizi. Patient had her last induction dose on 08/07/24. She said since starting the medication she has noticed increased constipation and headaches. She is concerns about moving forward with treatment due to s/e. Recommended visit with Dr.Kakadiya to review concerns, scheduled for 08/17/24.

## 2024-08-17 ENCOUNTER — Ambulatory Visit: Admit: 2024-08-17 | Payer: Medicare (Managed Care)

## 2024-08-24 NOTE — Progress Notes (Signed)
 [facility]    Patient Onboarding/Medication Counseling    Sent patient MyChart message for administration reference.   Has appt with [facility] 09/03/24 for virtual visit to review medication   Sent [facility] messages can deliver medication to patient on 09/04/24    Ms.Thumm is a 68 y.o. female with UC who I am counseling today on initiation of therapy.  I am speaking to the patient.    Was a nurse, learning disability used for this call? No    Verified patient's date of birth / HIPAA.    Specialty medication(s) to be sent: Inflammatory Disorders: Skyrizi       Non-specialty medications/supplies to be sent: N/A patient has a sharps       Medications not needed at this time: n/a         Skyrizi  (risankizumab )    Medication & Administration     Dosage: Ulcerative Colitis: Inject 360mg  under the skin every 8 weeks starting 4 weeks after 3rd IV induction dose (Completed at weeks 0, 4, and 8)  IV Induction: 9/18, 10/16, 11/13  1st OBI due: 12/11    Lab tests required prior to treatment initiation:  Tuberculosis: Tuberculosis screening resulted in a non-reactive Quantiferon TB Gold assay. (Completed: 04/30/2024)    Administration:     Air Traffic Controller all supplies needed for injection on a clean, flat working surface: Plastic tray containing the On-body injector and prefilled cartridge, alcohol swab, sharps container, etc.  Remove unopened carton from the refrigerator and allow it to warm up to room temperature for at least 45 but no more than 90 minutes.  Look at the medication label - look for correct medication, correct dose, and check the expiration date  Remove the On-body injector and prefilled cartridge from plastic tray  Look at the On-body injector - check that it is intact and undamaged. Do NOT close the gray door before the prefilled cartridge is loaded  Look at the prefilled cartridge - the liquid should appear clear and colorless to slightly yellow, you may see tiny white or clear particles. Do NOT twist or remove cartridge top.  Use an alcohol swab to clean the smaller bottom tip of the prefilled cartridge and let it air dry. Do not touch the smaller bottom tip after cleaning.  Insert the smaller bottom tip into the On-Body injector first. Firmly push down until you hear a click. There may be a few drops of medicine on the back of the On-body Injector. That is normal. Close the gray door and squeeze firmly until it snaps closed.   You MUST start the injection within 5 minutes of loading the prefilled cartridge.   Select injection site - you can use the front of your thigh or your belly (but not the area 2 inches around your belly button)  Prepare injection site - wash your hands and clean the skin at the injection site with an alcohol swab and let it air dry, do not touch the injection site again before the injection  Peel the green tabs on the back of the On-body injector to expose the adhesive. This will activate the On-body injector and cause the status light to turn BLUE.   For the belly, move and hold the skin to create a firm, flat surface. You do not need to pull the skin if injecting on the front of the thighs.  When the blue light flashes, it is ready to start the injection.  Place the On-body injector onto  the cleaned skin and then firmly press the gray button until you hear a click. This will start the injection and the light will continuously flash GREEN. This may take up to 5 minutes to complete the full dose.  The On-body Injector will automatically stop when the injection is finished. You will hear beeps and the status light will change to SOLID GREEN.   Remove the On-Body Injector by carefully peeling the adhesive from your skin. Avoid touching the needle or needle cover on the back of the On-Body Injector. You will hear several beeps and the status light will turn off.   Dispose of the used On-Body Injector immediately in engineer, water.      Adherence/Missed dose instructions:  If your injection is given more than 7 days after your scheduled injection date - consult your pharmacist for additional instructions on how to adjust your dosing schedule.    Goals of Therapy     Ulcerative Colitis  Achieve remission of symptoms  Maintain remission of symptoms  Minimize long-term systemic glucocorticoid use  Prevent need for surgical procedures/  Maintenance of effective psychosocial functioning    Side Effects & Monitoring Parameters     Injection site reaction (redness, irritation, inflammation localized to the site of administration)  Signs of a common cold - minor sore throat, runny or stuffy nose, etc.  Felling tired/weak  Headache  Stomach, joint or back pain    The following side effects should be reported to the provider:  Signs of a hypersensitivity reaction - rash; hives; itching; red, swollen, blistered, or peeling skin; wheezing; tightness in the chest or throat; difficulty breathing, swallowing, or talking; swelling of the mouth, face, lips, tongue, or throat; etc.  Reduced immune function - report signs of infection such as fever; chills; body aches; very bad sore throat; ear or sinus pain; cough; more sputum or change in color of sputum; pain with passing urine; wound that will not heal, etc.  Also at a slightly higher risk of some malignancies (mainly skin and blood cancers) due to this reduced immune function.  In the case of signs of infection - the patient should hold the next dose of Skyrizi ?? and call your primary care provider to ensure adequate medical care.  Treatment may be resumed when infection is treated and patient is asymptomatic.  Flu-like symptoms  Warm, red, or painful skin or sores on the body  Severe diarrhea or stomach pain      Contraindications, Warnings, & Precautions     Have your bloodwork checked as you have been told by your prescriber  Talk with your doctor if you are pregnant, planning to become pregnant, or breastfeeding  Discuss the possible need for holding your dose(s) of Skyrizi ?? when a planned procedure is scheduled with the prescriber as it may delay healing/recovery timeline       Drug/Food Interactions     Medication list reviewed in Epic. The patient was instructed to inform the care team before taking any new medications or supplements. No drug interactions identified.   Talk with you prescriber or pharmacist before receiving any live vaccinations while taking this medication and after you stop taking it    Storage, Handling Precautions, & Disposal     Store this medication in the refrigerator.  Do not freeze   If needed, you may store at room temperature for up to 24 hours  Store in original packaging, protected from light  Do not shake  Dispose of used  syringes/pens in a sharps disposal container          Current Medications (including OTC/herbals), Comorbidities and Allergies     Current Medications[1]    Allergies[2]    Problem List[3]    Medication list has been reviewed and updated in Epic: Yes    Allergies have been reviewed and updated in Epic: Yes    Appropriateness of Therapy     Acute infections noted within Epic:  No active infections  Patient reported infection: None    Is the medication and dose appropriate considering the patient???s diagnosis, treatment, and disease journey, comorbidities, medical history, current medications, allergies, therapeutic goals, self-administration ability, and access barriers? Yes    Prescription has been clinically reviewed: Yes      Baseline Quality of Life Assessment      How many days over the past month did your UC  keep you from your normal activities? For example, brushing your teeth or getting up in the morning. 0    Financial Information     Medication Assistance provided: None Required    Anticipated copay of $0/56 reviewed with patient. Verified delivery address.    Delivery Information     Scheduled delivery date: 09/04/24    Expected start date: 09/04/24      Medication will be delivered via [facility]ourier to the prescription address in [facility].  This shipment will not require a signature.      Explained the services we provide at [facility] and that each month we would call to set up refills.  Stressed importance of returning phone calls so that we could ensure they receive their medications in time each month.  Informed patient that we should be setting up refills 7-10 days prior to when they will run out of medication.  A pharmacist will reach out to perform a clinical assessment periodically.  Informed patient that a welcome packet, containing information about our pharmacy and other support services, a Notice of Privacy Practices, and a drug information handout will be sent.      The patient or caregiver noted above participated in the development of this care plan and knows that they can request review of or adjustments to the care plan at any time.      Patient or caregiver verbalized understanding of the above information as well as how to contact the pharmacy at [phone] option 4 with any questions/concerns.  The pharmacy is open Monday through Friday 8:30am-4:30pm.  A pharmacist is available 24/7 via pager to answer any clinical questions they may have.    Patient Specific Needs     Does the patient have any physical, cognitive, or cultural barriers? No    Does the patient have adequate living arrangements? (i.e. the ability to store and take their medication appropriately) Yes    Did you identify any home environmental safety or security hazards? No    Patient prefers to have medications discussed with  Patient     Is the patient or caregiver able to read and understand education materials at a high school level or above? Yes    Patient's primary language is  English     Is the patient high risk? No    Does the patient have an additional or emergency contact listed in their chart? Yes    SOCIAL DETERMINANTS OF HEALTH     At the [facility], we have learned that life circumstances - like trouble affording food,  housing, utilities, or transportation can affect the health of many of our patients.   That is why we wanted to ask: are you currently experiencing any life circumstances that are negatively impacting your health and/or quality of life? Patient declined to answer    Social Drivers of Health     Food Insecurity: No Food Insecurity (09/03/2023)    Received from [facility]    Hunger Vital Sign     Within the past 12 months, you worried that your food would run out before you got the money to buy more.: Never true     Within the past 12 months, the food you bought just didn't last and you didn't have money to get more.: Never true   Tobacco Use: Low Risk  (03/26/2024)    Received from [facility]    Patient History     Smoking Tobacco Use: Never     Smokeless Tobacco Use: Never     Passive Exposure: Never   Transportation Needs: No Transportation Needs (09/03/2023)    Received from [facility] - [facility]     Lack of Transportation (Medical): No     Lack of Transportation (Non-Medical): No   Alcohol Use: Not on file   Housing: Not on file   Physical Activity: Insufficiently Active (03/26/2024)    Received from [facility]    Exercise Vital Sign     On average, how many days per week do you engage in moderate to strenuous exercise (like a brisk walk)?: 2 days     On average, how many minutes do you engage in exercise at this level?: 60 min   Utilities: Not At Risk (09/03/2023)    Received from [facility]     Threatened with loss of utilities: No   Stress: No Stress Concern Present (09/03/2023)    Received from [facility] - Occupational Stress Questionnaire     Feeling of Stress : Only a little   Interpersonal Safety: Not on file   Substance Use: Not on file (07/31/2023)   Intimate Partner Violence: Not At Risk (09/03/2023)    Received from [facility]    Humiliation, Afraid, Rape, and Kick questionnaire     Within the last year, have you been afraid of your partner or ex-partner?: No     Within the last year, have you been humiliated or emotionally abused in other ways by your partner or ex-partner?: No     Within the last year, have you been kicked, hit, slapped, or otherwise physically hurt by your partner or ex-partner?: No     Within the last year, have you been raped or forced to have any kind of sexual activity by your partner or ex-partner?: No   Social Connections: Moderately Isolated (03/26/2024)    Received from [facility]    Social Connection and Isolation Panel     In a typical week, how many times do you talk on the phone with family, friends, or neighbors?: More than three times a week     How often do you get together with friends or relatives?: More than three times a week     How often do you attend church or religious services?: More than 4 times per year     Do you belong to any clubs or organizations such as church groups, unions, fraternal or athletic groups, or school groups?: No  How often do you attend meetings of the clubs or organizations you belong to?: Never     Are you married, widowed, divorced, separated, never married, or living with a partner?: Divorced   Physicist, Medical Strain: Low Risk  (09/03/2023)    Received from [facility]    Overall Financial Resource Strain (CARDIA)     Difficulty of Paying Living Expenses: Not hard at all   Health Literacy: Adequate Health Literacy (09/03/2023)    Received from [facility]    B1300 Health Literacy     Frequency of need for help with medical instructions: Never   Internet Connectivity: Not on file       Would you be willing to receive help with any of the needs that you have identified today? Not applicable       Burnard DELENA Neighbors, PharmD  [facility] Specialty Pharmacist         [1]   Current Outpatient Medications   Medication Sig Dispense Refill    acetaminophen (TYLENOL) 325 MG tablet Take 1 tablet (325 mg total) by mouth every four (4) hours as needed.      diphenhydrAMINE (BENADRYL) 25 mg capsule/tablet Take 1 each (25 mg total) by mouth every six (6) hours as needed for itching.      empty container Misc Use as directed 1 each PRN    lactobacillus combination no.4 (PROBIOTIC) 3 billion cell cap Take by mouth in the morning.      melatonin 5 mg tablet Take 1 tablet (5 mg total) by mouth nightly as needed.      omeprazole  (PRILOSEC) 20 MG capsule TAKE 1 CAPSULE BY MOUTH DAILY 30 MINUTES BEFORE BREAKFAST 90 capsule 3    predniSONE  (DELTASONE ) 10 MG tablet 20mg  qday x 14d, 15mg  qday x 7d, 10mg  qday x 7d, 5mg  qd x 7d. 60 tablet 0    risankizumab -rzaa (SKYRIZI ) 360 mg/2.4 mL (150 mg/mL) Injt Inject the contents of 1 cartridge (360mg ) under the skin every 8 weeks 2.4 mL 3     Current Facility-Administered Medications   Medication Dose Route Frequency Provider Last Rate Last Admin    famotidine (PEPCID) tablet 10 mg  10 mg Oral Once Tanda Gustav NOVAK, RN       [2]   Allergies  Allergen Reactions    Mesalamine Other (See Comments)     Pancreatitis    Amoxicillin Other (See Comments)     unknown    Metronidazole Nausea And Vomiting     Abdominal pain, dysgeusia    Trazodone Other (See Comments)     Severe back pack with muscle aches    Prednisone  Itching     Currently on prednisone  and tolerating    [3]   Patient Active Problem List  Diagnosis    Ulcerative pancolitis with complication    (CMS-HCC)    Multinodular goiter    Nephrolithiasis    Obstructive sleep apnea    Stress incontinence in female    Other osteoporosis without current pathological fracture    Syncope    Depression    Drug-induced lupus erythematosus

## 2024-09-03 ENCOUNTER — Encounter: Admit: 2024-09-03 | Discharge: 2024-09-04 | Payer: Medicare (Managed Care)

## 2024-09-03 NOTE — Progress Notes (Unsigned)
 [facility]  CLINICAL PHARMACIST PRACTITIONER VISIT    09/03/2024    HPI:     I saw Diamond Mcconnell today for follow up after initiation of risankizumab  for management of her Crohn's Disease at the request of Dr. Dorn Lauth.    Interval History:  History of Present Illness  Diamond Mcconnell is a 68 year old female with Crohn's disease who presents with constipation and bloating.    She reports increased constipation and bloating since starting Skyrizi  infusions three months ago. She completed all three infusions and is due for her first injection tomorrow. She reports that constipation has worsened, and she requires Smooth Move tea to have bowel movements. Without the tea, bowel movements occur every three to four days, whereas previously they were almost daily. She describes her bowel movements as incomplete, and reports persistent bloating.    Her diet has changed, with an eating window from noon to 6 or 7 PM. Morning intake includes coffee and sometimes a boiled egg or egg salad. She avoids high fiber foods due to stomach upset. A recent incident after eating steak and salad led to severe cramping and an urgent need to use the bathroom, causing embarrassment during a work event.    She has been experiencing headaches, which she attributes to Skyrizi , as she does not normally have headaches. Additionally, she describes a sharp, intermittent pain in her left side, which she associates with constipation.    Current medications include omeprazole  once daily. She occasionally takes probiotics and supplements like vitamin C, D3, and magnesium, but not regularly. She is no longer taking prednisone .      Evaluation of IBD Biologic Therapy: risankizumab   Time on Current Therapy: started 06/11/24     - Last Dose: 08/06/24     - Adherence: no missed doses     - Symptoms:  # of BM/day: Every other day   Stool Consistency: formed   Stool Urgency: none   Nocturnal BM: none   Blood in Stool: none Abdominal Pain: Cramping, intermittent pain when constipated   Nausea/Vomiting: none   Any Sickness: none   NSAID use: none   Antidiarrheal use: none      - Adverse Effects: none    Other IBD Therapies:  []  5-ASA  []  Immunomodulators  []  Corticosteroids    Disease Severity Index:  Simple Clinical Colitis Activity Index:  Bowel frequency per day: 1-3 bowel movements/day= 0  Bowel frequency at night:  None = 0  Urgency of defecation: No urgency = 0  Blood in stool:  None = 0  General well-being: Very well = 0  Extracolonic Manifestations: None  Total score: 0       MEDICATIONS/ALLERGIES:     Current Outpatient Medications   Medication Instructions    empty container Misc Use as directed    lactobacillus combination no.4 (PROBIOTIC) 3 billion cell cap Daily    melatonin 5 mg, Nightly PRN    omeprazole  (PRILOSEC) 20 MG capsule TAKE 1 CAPSULE BY MOUTH DAILY 30 MINUTES BEFORE BREAKFAST    risankizumab -rzaa (SKYRIZI ) 360 mg/2.4 mL (150 mg/mL) Injt Inject the contents of 1 cartridge (360mg ) under the skin every 8 weeks       Allergies   Allergen Reactions    Mesalamine Other (See Comments)     Pancreatitis    Amoxicillin Other (See Comments)     unknown    Metronidazole Nausea And Vomiting     Abdominal pain, dysgeusia  Trazodone Other (See Comments)     Severe back pack with muscle aches    Prednisone  Itching     Currently on prednisone  and tolerating          IBD HISTORY:     Year of disease onset:  1981  Diagnosis:  Ulcerative Colitis  Age at onset:   26-40 yr old (A2)  Location:  Extensive (E3)  Behavior:  Colitis  Perianal Dz:  N/A    Brief IBD Disease Course:      Diagnosed 1981.  Asacol started.  Within a few days, hospitalized for pancreatitis.  Asacol stopped.  Oral mesalamine never revisited.  Canasa and Rowasa enemas were tolerated and resulted in partial relief of symptoms.  Briefly took prednisone  on one occasion and it caused weight gain, hair loss, insomnia.  Uceris  helped colitis symptoms but was too expensive to continue.  07/2017, colitis flare.  Sulfasalazine tried without success.  03/19/2018, start Uceris .  Minimal relief.  05/01/2018, start Entyvio.  Continued flare.  05/08/2018, start prednisone .  05/14/2018, hospitalized for flare.  IV steroids and discharged on oral prednisone .  05/23/2018, start infliximab 5 mg/kg.  08/28/2018, steroid free remission.  11/2018, severe joint pains.  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  Change infliximab to ustekinumab .  Prednisone  taper and Celebrex  as needed.  04/2020, stopped ustekinumab  due to logistical problems.  10/2020, resume ustekinumab . 01/03/2022, colonoscopy showed Mayo 2 inflammation. Unsuccessful attempt to increase ustekinumab . 06/15/2022, increase ustekinumab  to every 6 weeks.  01/2024, increased rectal urgency. 05/2024 started risankizumab     Endoscopy:      Colonoscopy 01/03/2022  - The examined portion of the ileum was normal.  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    Imaging:    None relevant    Extraintestinal Manifestations:   []  Joint Pains:  []  Ocular:  []  Skin:  []  Oral ulcers:  []  Thrombosis:  []  PSC:  []  Other:    Prior IBD Therapy:  [x]  5-ASAs - Asacol, Cansasa, Rowasa, sulfasalazine  [x]  Oral corticosteroids - Uceris , prednisone   []  Intravenous corticosteroids  []  Antibiotics  []  Thiopurines   []  Methotrexate  [x]  Anti-TNF therapies - infliximab  [x]  Anti-Integrin therapies - Entyvio  [x]  Anti-Interleukin therapies - ustekinumab   []  Anti-JAK therapies  []  Cyclosporine  []  Clinical trial medication  []  Other (Please specify):    IBD Health Maintenance    Vaccine Date   Influenza --   Pneumococcal --   COVID-19 --   Zoster --   Hepatitis B --       []  Iron Deficiency  Lab Results   Component Value Date    HGB 11.6 (L) 12/18/2018       []  Vitamin D Deficiency  Lab Results   Component Value Date    VITDTOTAL 24 09/05/2011          RELEVANT LABS, DATA, INDICES: Lab Results   Component Value Date    HBSAG Nonreactive 05/16/2018    QFTTBGOLD Negative 04/30/2024       Lab Results   Component Value Date    WBC 4.3 (L) 12/18/2018    HGB 11.6 (L) 12/18/2018    HCT 36.7 12/18/2018    PLT 296 12/18/2018       Lab Results   Component Value Date    NA 138 05/20/2018    K 4.4 05/20/2018    CL 104 05/20/2018    CO2 28.0 05/20/2018  BUN 13 05/20/2018    CREATININE 0.69 05/20/2018    GLU 88 05/20/2018    CALCIUM 8.7 05/20/2018    MG 2.1 05/15/2018    PHOS 4.3 05/15/2018       Lab Results   Component Value Date    BILITOT 0.4 04/30/2024    BILIDIR <0.10 04/30/2024    PROT 7.3 04/30/2024    ALBUMIN 3.9 04/30/2024    ALT 11 04/30/2024    AST 12 04/30/2024    ALKPHOS 77 04/30/2024    GGT 20 09/05/2011       Lab Results   Component Value Date    CRP <5.0 12/18/2018    NULL 359 (H) 04/10/2024       Lab Results   Component Value Date    Infliximab 10 11/26/2018    Infliximab 12 07/04/2018         ASSESSMENT & RECOMMENDATIONS:     Assessment & Plan  Crohn's disease  Managed with Skyrizi  with positive clinical response. Completed induction infusions, transitioning to maintenance injections. Constipation and headaches likely due to Skyrizi 's efficacy. Headaches expected to decrease with steady medication levels with subcutaneous injections.   - Continue Skyrizi  injections every eight weeks     -- Next safety labs due in May 2026  - Monitor for headache resolution with maintenance injections.    Constipation  Likely secondary to Skyrizi  and dietary changes. Bowel movements reduced to every three to four days. Smooth Move Tea provides relief but is not ideal for long-term use. Dietary changes, including reduced fiber, may contribute. Miralax  recommended to bulk up stool and improve regularity.  - Start Miralax  once daily to bulk up stool and improve regularity.  - Use Smooth Move Tea as needed if no bowel movement occurs within three days.  - Monitor bowel movement frequency and adjust treatment as necessary.      The patient reports they are physically located in [place]  and is currently: at home. I conducted a audio/video visit. I spent  1m 30s on the video call with the patient. I spent an additional 10 minutes on pre- and post-visit activities on the date of service .     --------------------------------------------    Dewayne Motley, PharmD, BCPS, CPP  Clinical Pharmacist Practitioner - GI/IBD  [facility]         [1]   Allergies  Allergen Reactions    Mesalamine Other (See Comments)     Pancreatitis    Amoxicillin Other (See Comments)     unknown    Metronidazole Nausea And Vomiting     Abdominal pain, dysgeusia    Trazodone Other (See Comments)     Severe back pack with muscle aches    Prednisone  Itching     Currently on prednisone  and tolerating

## 2024-10-20 DIAGNOSIS — K51019 Ulcerative (chronic) pancolitis with unspecified complications: Principal | ICD-10-CM

## 2024-10-20 NOTE — Progress Notes (Signed)
 Diamond Mcconnell has been contacted in regards to their refill of Skyrizi  OBI. At this time, they have declined refill due to unable to afford copay even with enrolling into MPPP. Sent urgent referral to triage and clinic to start mfg assistance process.  Patient's next dose due ~2/7.     Harlene Elder, PharmD  [facility]

## 2024-11-26 ENCOUNTER — Ambulatory Visit

## 2024-12-17 ENCOUNTER — Ambulatory Visit: Admitting: Nurse Practitioner
